# Patient Record
Sex: Male | Born: 1980 | Race: Black or African American | Hispanic: No | Marital: Single | Smoking: Current every day smoker
Health system: Southern US, Community
[De-identification: ages and names within clinical notes are randomized; demographics above are authoritative.]

## PROBLEM LIST (undated history)

## (undated) DIAGNOSIS — F329 Major depressive disorder, single episode, unspecified: Secondary | ICD-10-CM

## (undated) DIAGNOSIS — G4700 Insomnia, unspecified: Secondary | ICD-10-CM

## (undated) DIAGNOSIS — E871 Hypo-osmolality and hyponatremia: Secondary | ICD-10-CM

## (undated) DIAGNOSIS — L0390 Cellulitis, unspecified: Secondary | ICD-10-CM

## (undated) DIAGNOSIS — F1010 Alcohol abuse, uncomplicated: Secondary | ICD-10-CM

## (undated) DIAGNOSIS — F419 Anxiety disorder, unspecified: Secondary | ICD-10-CM

## (undated) DIAGNOSIS — F319 Bipolar disorder, unspecified: Secondary | ICD-10-CM

## (undated) DIAGNOSIS — L0291 Cutaneous abscess, unspecified: Secondary | ICD-10-CM

## (undated) DIAGNOSIS — D72829 Elevated white blood cell count, unspecified: Secondary | ICD-10-CM

## (undated) DIAGNOSIS — F32A Depression, unspecified: Secondary | ICD-10-CM

## (undated) DIAGNOSIS — M549 Dorsalgia, unspecified: Secondary | ICD-10-CM

## (undated) DIAGNOSIS — G8929 Other chronic pain: Secondary | ICD-10-CM

## (undated) DIAGNOSIS — K859 Acute pancreatitis without necrosis or infection, unspecified: Secondary | ICD-10-CM

## (undated) HISTORY — PX: INCISION AND DRAINAGE ABSCESS: SHX5864

---

## 1999-03-30 ENCOUNTER — Emergency Department (HOSPITAL_COMMUNITY): Admission: EM | Admit: 1999-03-30 | Discharge: 1999-03-30 | Payer: Self-pay | Admitting: Emergency Medicine

## 2006-05-27 ENCOUNTER — Emergency Department (HOSPITAL_COMMUNITY): Admission: EM | Admit: 2006-05-27 | Discharge: 2006-05-28 | Payer: Self-pay | Admitting: Emergency Medicine

## 2006-05-31 ENCOUNTER — Emergency Department (HOSPITAL_COMMUNITY): Admission: EM | Admit: 2006-05-31 | Discharge: 2006-05-31 | Payer: Self-pay | Admitting: Family Medicine

## 2006-11-17 ENCOUNTER — Emergency Department (HOSPITAL_COMMUNITY): Admission: EM | Admit: 2006-11-17 | Discharge: 2006-11-17 | Payer: Self-pay | Admitting: Emergency Medicine

## 2007-05-21 ENCOUNTER — Emergency Department (HOSPITAL_COMMUNITY): Admission: EM | Admit: 2007-05-21 | Discharge: 2007-05-21 | Payer: Self-pay | Admitting: Emergency Medicine

## 2007-09-30 ENCOUNTER — Emergency Department (HOSPITAL_COMMUNITY): Admission: EM | Admit: 2007-09-30 | Discharge: 2007-09-30 | Payer: Self-pay | Admitting: Emergency Medicine

## 2008-08-14 ENCOUNTER — Emergency Department (HOSPITAL_COMMUNITY): Admission: EM | Admit: 2008-08-14 | Discharge: 2008-08-14 | Payer: Self-pay | Admitting: Emergency Medicine

## 2009-06-20 ENCOUNTER — Emergency Department (HOSPITAL_COMMUNITY): Admission: EM | Admit: 2009-06-20 | Discharge: 2009-06-20 | Payer: Self-pay | Admitting: Emergency Medicine

## 2010-12-19 LAB — POCT I-STAT, CHEM 8
BUN: 11 mg/dL (ref 6–23)
Calcium, Ion: 1.14 mmol/L (ref 1.12–1.32)
Chloride: 101 mEq/L (ref 96–112)
Creatinine, Ser: 0.6 mg/dL (ref 0.4–1.5)
Glucose, Bld: 83 mg/dL (ref 70–99)
Potassium: 3.8 mEq/L (ref 3.5–5.1)
Sodium: 139 mEq/L (ref 135–145)
TCO2: 30 mmol/L (ref 0–100)

## 2010-12-19 LAB — CULTURE, ROUTINE-ABSCESS

## 2010-12-19 LAB — GRAM STAIN

## 2011-02-24 ENCOUNTER — Emergency Department (HOSPITAL_COMMUNITY)
Admission: EM | Admit: 2011-02-24 | Discharge: 2011-02-25 | Disposition: A | Discharge status: Home / Self Care | Payer: Self-pay | Attending: Emergency Medicine | Admitting: Emergency Medicine

## 2011-02-24 DIAGNOSIS — N453 Epididymo-orchitis: Secondary | ICD-10-CM | POA: Insufficient documentation

## 2011-02-24 DIAGNOSIS — N5089 Other specified disorders of the male genital organs: Secondary | ICD-10-CM | POA: Insufficient documentation

## 2011-02-24 DIAGNOSIS — N509 Disorder of male genital organs, unspecified: Secondary | ICD-10-CM | POA: Insufficient documentation

## 2011-02-24 LAB — URINALYSIS, ROUTINE W REFLEX MICROSCOPIC
Ketones, ur: 15 mg/dL — AB
Nitrite: NEGATIVE

## 2011-02-24 LAB — URINE MICROSCOPIC-ADD ON

## 2011-02-25 ENCOUNTER — Emergency Department (HOSPITAL_COMMUNITY): Payer: Self-pay

## 2011-02-26 LAB — URINE CULTURE: Culture  Setup Time: 201206120920

## 2011-02-26 LAB — GC/CHLAMYDIA PROBE AMP, URINE: Chlamydia, Swab/Urine, PCR: NEGATIVE

## 2011-06-06 LAB — DIFFERENTIAL
Eosinophils Absolute: 0.1
Eosinophils Relative: 1
Lymphocytes Relative: 25
Lymphs Abs: 1.7
Monocytes Absolute: 0.5
Neutrophils Relative %: 65

## 2011-06-06 LAB — URINALYSIS, ROUTINE W REFLEX MICROSCOPIC
Glucose, UA: NEGATIVE
Ketones, ur: 15 — AB
Nitrite: NEGATIVE
Protein, ur: 30 — AB
Urobilinogen, UA: 1

## 2011-06-06 LAB — RAPID URINE DRUG SCREEN, HOSP PERFORMED
Amphetamines: NOT DETECTED
Barbiturates: NOT DETECTED
Benzodiazepines: NOT DETECTED
Cocaine: NOT DETECTED

## 2011-06-06 LAB — COMPREHENSIVE METABOLIC PANEL
AST: 67 — ABNORMAL HIGH
Albumin: 4.3
BUN: 10
CO2: 26
Chloride: 97
GFR calc Af Amer: >60
Glucose, Bld: 78
Sodium: 135
Total Bilirubin: 0.8

## 2011-06-06 LAB — CBC
HCT: 43.7
RBC: 5.12
RDW: 13.8
WBC: 6.5

## 2011-08-12 ENCOUNTER — Encounter: Payer: Self-pay | Admitting: Emergency Medicine

## 2011-08-12 ENCOUNTER — Emergency Department (HOSPITAL_COMMUNITY)
Admission: EM | Admit: 2011-08-12 | Discharge: 2011-08-12 | Discharge status: Against Medical Advice | Payer: Self-pay | Attending: Emergency Medicine | Admitting: Emergency Medicine

## 2011-08-12 DIAGNOSIS — Z0389 Encounter for observation for other suspected diseases and conditions ruled out: Secondary | ICD-10-CM | POA: Insufficient documentation

## 2011-08-12 NOTE — ED Notes (Signed)
PT. REPORTS VISUAL HALLUCINATIONS - STATES SOMEBODY IS CHASING HIM " THEY ARE AFTER MY MONEY" , DENIES SUICIDAL IDEATION .

## 2011-08-13 ENCOUNTER — Encounter (HOSPITAL_COMMUNITY): Payer: Self-pay | Admitting: Emergency Medicine

## 2011-08-13 ENCOUNTER — Emergency Department (HOSPITAL_COMMUNITY)
Admission: EM | Admit: 2011-08-13 | Discharge: 2011-08-13 | Discharge status: Against Medical Advice | Payer: Self-pay | Attending: Emergency Medicine | Admitting: Emergency Medicine

## 2011-08-13 DIAGNOSIS — M25579 Pain in unspecified ankle and joints of unspecified foot: Secondary | ICD-10-CM | POA: Insufficient documentation

## 2011-08-13 DIAGNOSIS — M25473 Effusion, unspecified ankle: Secondary | ICD-10-CM | POA: Insufficient documentation

## 2011-08-13 DIAGNOSIS — M25476 Effusion, unspecified foot: Secondary | ICD-10-CM | POA: Insufficient documentation

## 2011-08-13 NOTE — ED Notes (Signed)
PT. ARRIVED WITH PTAR FROM STREET REPORTS RIIGHT ANKLE PAIN X SEVERAL DAYS WITH SWELLING , DENIES INJURY OR FALL .

## 2011-08-13 NOTE — ED Notes (Signed)
Returned to triage 

## 2011-08-13 NOTE — ED Notes (Signed)
Pt walked out stating that he would go someplace else. Would not sign AMA/LWBS form.

## 2011-08-13 NOTE — ED Notes (Signed)
Pt had made remark to Antonio Alvarez, that he likes to kill people.  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  Security asked to check pt who has on bulky clothing for possible weapons and long sharpened rod removed and placed at nurse station for now.

## 2011-08-13 NOTE — ED Notes (Signed)
Pt instructed to keep feet elevated on bed for MD to assess.  Right ankle/foot red,swollen and warm to touch.

## 2011-08-14 ENCOUNTER — Encounter (HOSPITAL_COMMUNITY): Payer: Self-pay

## 2011-08-14 ENCOUNTER — Emergency Department (HOSPITAL_COMMUNITY): Payer: Self-pay

## 2011-08-14 ENCOUNTER — Inpatient Hospital Stay (HOSPITAL_COMMUNITY)
Admission: EM | Admit: 2011-08-14 | Discharge: 2011-08-16 | DRG: 603 | Disposition: A | Discharge status: Home / Self Care | Payer: Self-pay | Attending: Internal Medicine | Admitting: Internal Medicine

## 2011-08-14 DIAGNOSIS — E860 Dehydration: Secondary | ICD-10-CM | POA: Diagnosis present

## 2011-08-14 DIAGNOSIS — L0390 Cellulitis, unspecified: Secondary | ICD-10-CM

## 2011-08-14 DIAGNOSIS — F17200 Nicotine dependence, unspecified, uncomplicated: Secondary | ICD-10-CM | POA: Diagnosis present

## 2011-08-14 DIAGNOSIS — L03119 Cellulitis of unspecified part of limb: Principal | ICD-10-CM | POA: Diagnosis present

## 2011-08-14 DIAGNOSIS — E871 Hypo-osmolality and hyponatremia: Secondary | ICD-10-CM | POA: Diagnosis present

## 2011-08-14 DIAGNOSIS — F1010 Alcohol abuse, uncomplicated: Secondary | ICD-10-CM | POA: Diagnosis present

## 2011-08-14 DIAGNOSIS — L02619 Cutaneous abscess of unspecified foot: Principal | ICD-10-CM | POA: Diagnosis present

## 2011-08-14 DIAGNOSIS — D72829 Elevated white blood cell count, unspecified: Secondary | ICD-10-CM | POA: Diagnosis present

## 2011-08-14 DIAGNOSIS — E876 Hypokalemia: Secondary | ICD-10-CM | POA: Diagnosis present

## 2011-08-14 DIAGNOSIS — L03115 Cellulitis of right lower limb: Secondary | ICD-10-CM | POA: Diagnosis present

## 2011-08-14 DIAGNOSIS — F10239 Alcohol dependence with withdrawal, unspecified: Secondary | ICD-10-CM | POA: Diagnosis present

## 2011-08-14 HISTORY — DX: Cutaneous abscess, unspecified: L02.91

## 2011-08-14 LAB — DIFFERENTIAL
Basophils Relative: 0 % (ref 0–1)
Eosinophils Absolute: 0.2 10*3/uL (ref 0.0–0.7)
Eosinophils Relative: 1 % (ref 0–5)
Lymphs Abs: 1.9 10*3/uL (ref 0.7–4.0)
Neutrophils Relative %: 78 % — ABNORMAL HIGH (ref 43–77)

## 2011-08-14 LAB — CBC
MCH: 29.6 pg (ref 26.0–34.0)
MCHC: 34 g/dL (ref 30.0–36.0)
MCV: 87.1 fL (ref 78.0–100.0)
Platelets: 385 10*3/uL (ref 150–400)
RBC: 4.87 MIL/uL (ref 4.22–5.81)
RDW: 13.9 % (ref 11.5–15.5)

## 2011-08-14 LAB — BASIC METABOLIC PANEL
Calcium: 9.3 mg/dL (ref 8.4–10.5)
GFR calc Af Amer: >90 mL/min (ref >90)
GFR calc non Af Amer: >90 mL/min (ref >90)
Glucose, Bld: 104 mg/dL — ABNORMAL HIGH (ref 70–99)
Sodium: 127 mEq/L — ABNORMAL LOW (ref 135–145)

## 2011-08-14 LAB — SEDIMENTATION RATE: Sed Rate: 44 mm/hr — ABNORMAL HIGH (ref 0–16)

## 2011-08-14 MED ORDER — THERA M PLUS PO TABS
1.0000 | ORAL_TABLET | Freq: Every day | ORAL | Status: DC
  Administered 2011-08-14: 12:00:00 via ORAL
  Administered 2011-08-15: 1 via ORAL
  Administered 2011-08-16: 10:00:00 via ORAL
  Filled 2011-08-14 (×4): qty 1

## 2011-08-14 MED ORDER — FOLIC ACID 1 MG PO TABS
1.0000 mg | ORAL_TABLET | Freq: Every day | ORAL | Status: DC
  Administered 2011-08-14 – 2011-08-16 (×3): 1 mg via ORAL
  Filled 2011-08-14 (×4): qty 1

## 2011-08-14 MED ORDER — MORPHINE SULFATE 2 MG/ML IJ SOLN
1.0000 mg | INTRAMUSCULAR | Status: DC | PRN
  Administered 2011-08-16: 1 mg via INTRAVENOUS
  Filled 2011-08-14: qty 1

## 2011-08-14 MED ORDER — VANCOMYCIN HCL 1000 MG IV SOLR
1250.0000 mg | Freq: Two times a day (BID) | INTRAVENOUS | Status: DC
  Administered 2011-08-14 – 2011-08-16 (×4): 1250 mg via INTRAVENOUS
  Filled 2011-08-14 (×6): qty 1250

## 2011-08-14 MED ORDER — ACETAMINOPHEN 650 MG RE SUPP: 650.0000 mg | Freq: Four times a day (QID) | RECTAL | Status: DC | PRN

## 2011-08-14 MED ORDER — ONDANSETRON HCL 4 MG PO TABS: 4.0000 mg | ORAL_TABLET | Freq: Four times a day (QID) | ORAL | Status: DC | PRN

## 2011-08-14 MED ORDER — POLYETHYLENE GLYCOL 3350 17 G PO PACK
17.0000 g | PACK | Freq: Every day | ORAL | Status: DC
  Administered 2011-08-14 – 2011-08-16 (×3): 17 g via ORAL
  Filled 2011-08-14 (×4): qty 1

## 2011-08-14 MED ORDER — VITAMIN B-1 100 MG PO TABS
100.0000 mg | ORAL_TABLET | Freq: Every day | ORAL | Status: DC
  Administered 2011-08-14 – 2011-08-16 (×3): 100 mg via ORAL
  Filled 2011-08-14 (×4): qty 1

## 2011-08-14 MED ORDER — OXYCODONE HCL 5 MG PO TABS
5.0000 mg | ORAL_TABLET | ORAL | Status: DC | PRN
  Administered 2011-08-16: 5 mg via ORAL
  Filled 2011-08-14: qty 1

## 2011-08-14 MED ORDER — SODIUM CHLORIDE 0.9 % IV BOLUS (SEPSIS)
1000.0000 mL | Freq: Once | INTRAVENOUS | Status: AC
  Administered 2011-08-14: 1000 mL via INTRAVENOUS

## 2011-08-14 MED ORDER — ACETAMINOPHEN 325 MG PO TABS: 650.0000 mg | ORAL_TABLET | Freq: Four times a day (QID) | ORAL | Status: DC | PRN

## 2011-08-14 MED ORDER — SODIUM CHLORIDE 0.9 % IV SOLN
INTRAVENOUS | Status: DC
  Administered 2011-08-14 – 2011-08-16 (×4): via INTRAVENOUS

## 2011-08-14 MED ORDER — ONDANSETRON HCL 4 MG/2ML IJ SOLN: 4.0000 mg | Freq: Four times a day (QID) | INTRAMUSCULAR | Status: DC | PRN

## 2011-08-14 MED ORDER — CLINDAMYCIN PHOSPHATE 900 MG/50ML IV SOLN
900.0000 mg | Freq: Once | INTRAVENOUS | Status: AC
  Administered 2011-08-14: 900 mg via INTRAVENOUS
  Filled 2011-08-14: qty 50

## 2011-08-14 MED ORDER — ENOXAPARIN SODIUM 40 MG/0.4ML ~~LOC~~ SOLN
40.0000 mg | SUBCUTANEOUS | Status: DC
  Administered 2011-08-14 – 2011-08-15 (×2): 40 mg via SUBCUTANEOUS
  Filled 2011-08-14 (×4): qty 0.4

## 2011-08-14 NOTE — ED Notes (Signed)
Pt states he is homeless-states developed pain and swelling to his right foot and ankle 1 week ago-states he has been walking long distances and states the pain and swelling has increased-denies injury-no open wound noted

## 2011-08-14 NOTE — H&P (Signed)
Hospital Admission Note Date: 08/14/2011  Patient name: Antonio Alvarez           Medical record number: [id number] Date of birth: 19-Dec-1980           Age: 30 y.o.   Gender: male    PCP:   No primary provider on file.   Chief Complaint:  My right ankle hurts  HPI: Antonio Alvarez is a 30 y.o. male with no significant past medical history came in to the hospital because of her right ankle pain and swelling. Patient said his right ankle started to swell about 7 days ago. Soon needed and noticed redness pain and tenderness when he walks. Patient denies any fever or chills. Denies any nausea or vomiting. Upon initial evaluation in the emergency department patient right ankle as well and he has very red skin so the ankle was not tapped to rule out any joint involvement. His sodium also is 127 patient will be admitted for further evaluation.   Past Medical History: Past Medical History  Diagnosis Date  . Abscess     hx of abscess on neck   Past Surgical History  Procedure Date  . Abscess removed from neck     Medications: He does not take any medications at home.  Allergies:  No Known Allergies  Social History:  reports that he has been smoking Cigarettes.  He has a 7.5 pack-year smoking history. He has never used smokeless tobacco. He reports that he drinks about 21 ounces of alcohol per week. He reports that he uses illicit drugs (Marijuana).  Family History: Family History  Problem Relation Age of Onset  . Alcohol abuse Father     Review of Systems:  Constitutional: negative for anorexia, fevers and sweats Eyes: negative for irritation, redness and visual disturbance Ears, nose, mouth, throat, and face: negative for earaches, epistaxis, nasal congestion and sore throat Respiratory: negative for cough, dyspnea on exertion, sputum and wheezing Cardiovascular: negative for chest pain, dyspnea, lower extremity edema, orthopnea, palpitations and syncope Gastrointestinal: negative  for abdominal pain, constipation, diarrhea, melena, nausea and vomiting Genitourinary:negative for dysuria, frequency and hematuria Hematologic/lymphatic: negative for bleeding, easy bruising and lymphadenopathy Musculoskeletal:negative for arthralgias, muscle weakness and stiff joints Neurological: negative for coordination problems, gait problems, headaches and weakness Endocrine: negative for diabetic symptoms including polydipsia, polyuria and weight loss Allergic/Immunologic: negative for anaphylaxis, hay fever and urticaria  Physical Exam: BP 116/60  Pulse 71  Temp(Src) 98 F (36.7 C) (Oral)  Resp 16  Ht 5\' 11"  (1.803 m)  Wt 61.236 kg (135 lb)  BMI 18.83 kg/m2  SpO2 98% General appearance: alert, cooperative and no distress  Head: Normocephalic, without obvious abnormality, atraumatic  Eyes: conjunctivae/corneas clear. PERRL, EOM's intact. Fundi benign.  Nose: Nares normal. Septum midline. Mucosa normal. No drainage or sinus tenderness.  Throat: lips, mucosa, and tongue normal; teeth and gums normal  Neck: Supple, no masses, no cervical lymphadenopathy, no JVD appreciated, no meningeal signs Resp: clear to auscultation bilaterally  Chest wall: no tenderness  Cardio: regular rate and rhythm, S1, S2 normal, no murmur, click, rub or gallop  GI: soft, non-tender; bowel sounds normal; no masses, no organomegaly  Extremities: extremities normal, atraumatic, no cyanosis or edema  Skin: Skin color, texture, turgor normal. No rashes or lesions  Neurologic: Alert and oriented X 3, normal strength and tone. Normal symmetric reflexes. Normal coordination and gait  Labs on Admission:   [facility] 08/14/11 0810  NA 127*  K 3.6  CL 91*  CO2 27  GLUCOSE 104*  BUN 16  CREATININE 0.83  CALCIUM 9.3  MG --  PHOS --   Basename 08/14/11 0810  WBC 14.1*  NEUTROABS 10.9*  HGB 14.4  HCT 42.4  MCV 87.1  PLT 385   Radiological Exams on Admission: Dg Ankle Complete Right  08/14/2011   *RADIOLOGY REPORT*  Clinical Data: Pain and swelling, redness, no known injury  RIGHT ANKLE - COMPLETE 3+ VIEW  Comparison: None  Findings: Diffuse soft tissue swelling. Ankle mortise intact. Bone mineralization normal. No acute fracture, dislocation or bone destruction.  IMPRESSION: No acute bony abnormalities.  Original Report Authenticated By: Lollie Marrow, M.D.   Dg Foot Complete Right  08/14/2011  *RADIOLOGY REPORT*  Clinical Data: Pain and swelling to right foot, radiating to the lower leg, no known trauma  RIGHT FOOT COMPLETE - 3+ VIEW  Comparison: None.  Findings: No fracture or dislocation.  Joint spaces are preserved. There is a minimal amount of diffuse soft tissue swelling about the mid and hind foot without associated radiopaque foreign body or subcutaneous emphysema.  No discrete areas of osteolysis to suggest osteomyelitis.  IMPRESSION: Mild diffuse tissue swelling about the mid and hind foot without associated fracture or radiopaque foreign body.  Original Report Authenticated By: Waynard Reeds, M.D.     IMPRESSION: Present on Admission:  .Cellulitis of right foot .Hyponatremia .Leukocytosis .Dehydration .Alcohol abuse  Assessment/Plan  1. Cellulitis of the right foot: Patient will be admitted to the hospital to MedSurg bed. Will be started on IV vancomycin. Probably patient will benefit from the de-escalating the antibiotics in 1 or 2 days if he gets better. Patient does appear to have gouty arthritis or any other type of joint movement. This resembles his life without any problems.  2. Alcohol use: Patient said he drinks about 4-5 beers a day, he never had a history of alcohol withdrawal. We'll put him on multivitamin, thiamine and B12. We watch for alcohol withdrawal symptoms.  3. Leukocytosis: this is likely secondary to the cellulitis, the antibiotics started and will check the CBC in the morning.  4. Hyponatremia: This is likely because of dehydration. Alcohol also  might play a role here, Beer potomania sometimes can causes low sodium level, patient seems dehydrated I will start him on the normal saline.   Ruthella Kirchman A 08/14/2011, 11:19 AM

## 2011-08-14 NOTE — Discharge Planning (Signed)
ED CM noted Referral from Admission RN.  Referral made to unit CM possible d/c needs and Unit SW for homelessness

## 2011-08-14 NOTE — ED Notes (Signed)
Pt transported to xray via wheel chair

## 2011-08-14 NOTE — Progress Notes (Signed)
ANTIBIOTIC CONSULT NOTE - INITIAL  Pharmacy Consult for Vancomycin Indication: Cellulitis  No Known Allergies  Patient Measurements: Height: 5\' 11"  (180.3 cm) Weight: 135 lb (61.236 kg) IBW/kg (Calculated) : 75.3   Vital Signs: Temp: 97.9 F (36.6 C) (11/29 1210) Temp src: Oral (11/29 1210) BP: 121/67 mmHg (11/29 1210) Pulse Rate: 79  (11/29 1210) Intake/Output from previous day:   Intake/Output from this shift:    Labs:  [facility] 08/14/11 0810  WBC 14.1*  HGB 14.4  PLT 385  LABCREA --  CREATININE 0.83   Estimated Creatinine Clearance: 110.6 ml/min (by C-G formula based on Cr of 0.83). No results found for this basename: VANCOTROUGH:2,VANCOPEAK:2,VANCORANDOM:2,GENTTROUGH:2,GENTPEAK:2,GENTRANDOM:2,TOBRATROUGH:2,TOBRAPEAK:2,TOBRARND:2,AMIKACINPEAK:2,AMIKACINTROU:2,AMIKACIN:2, in the last 72 hours   Microbiology: No results found for this or any previous visit (from the past 720 hour(s)).  Medical History: Past Medical History  Diagnosis Date  . Abscess     hx of abscess on neck    Medications:  No prescriptions prior to admission   Scheduled:    . clindamycin (CLEOCIN) IV  900 mg Intravenous Once  . enoxaparin  40 mg Subcutaneous Q24H  . folic acid  1 mg Oral Daily  . multivitamins ther. w/minerals  1 tablet Oral Daily  . polyethylene glycol  17 g Oral Daily  . sodium chloride  1,000 mL Intravenous Once  . thiamine  100 mg Oral Daily   Infusions:    . sodium chloride 100 mL/hr at 08/14/11 1604   Anti-infectives     Start     Dose/Rate Route Frequency Ordered Stop   08/14/11 1000   clindamycin (CLEOCIN) IVPB 900 mg        900 mg 100 mL/hr over 30 Minutes Intravenous  Once 08/14/11 0856 08/14/11 1015         Assessment: Empiric Vancomycin for right ankle cellulitis.  Goal of Therapy:  Vancomycin trough level 10-15 mcg/ml  Plan:  Vancomycin 1250mg  IV q12h Measure antibiotic drug levels at steady state Follow up culture results  Reece Packer 08/14/2011,4:34 PM

## 2011-08-14 NOTE — Plan of Care (Signed)
Problem: Consults Goal: Cellulitis Patient Education See Patient Education Module for education specifics. Outcome: Progressing Explained to patient his admitting diagnosis and course of treatment to resolve cellulitis.  Problem: Phase I Progression Outcomes Goal: Wound assessment- dressing change as appropriate Outcome: Not Applicable Date Met:  08/14/11 Patient does not have an open wound associated with cellulitis to the right ankle.

## 2011-08-14 NOTE — Discharge Planning (Signed)
ED CM spoke with pt in Rm #6.  Confirmed self pay, no pcp [facility] resident, never been to health [facility].  Cm discussed with pt [facility] services, [employer], Costs & eligibility processes for self pay medical services, definition of self pay, referrals to [facility] and financial counselor (which he agreed to), community resources for homelessness, financial assistance, housing, health department and how to apply for low cost health insurance.  Pt has not request assistance for medications but is aware of low cost $4 medications at [facility].  Pt inquired about "job"  Referred & encouraged to go to any [employer] human resources department on any campus for job application.  Pt appreciative of referrals and resources. Left written information packet about all resources discussed with pt.  Referral sent to [facility] coordinator also Will be seen by financial counselor after 1 day admission

## 2011-08-14 NOTE — ED Provider Notes (Addendum)
History     CSN: [id number] Arrival date & time: 08/14/2011  6:25 AM   First MD Initiated Contact with Patient 08/14/11 [phone]      Chief Complaint  Patient presents with  . Joint Swelling    (Consider location/radiation/quality/duration/timing/severity/associated sxs/prior treatment) Patient is a 30 y.o. male presenting with ankle pain. The history is provided by the patient.  Ankle Pain  Incident onset: About one week ago. Incident location: Patient has been sleeping in a back room. He thinks his foot may have been hit. The injury mechanism is unknown. The pain is present in the right ankle and right foot. The quality of the pain is described as aching (Pain is the worst when standing and walking). The pain is at a severity of 4/10. The pain is moderate. The pain has been worsening since onset. Pertinent negatives include no inability to bear weight, no loss of motion, no loss of sensation and no tingling. Associated symptoms comments: However he is still able to walk. He reports no foreign bodies present. The symptoms are aggravated by activity, bearing weight and palpation. He has tried rest for the symptoms. The treatment provided no relief.    History reviewed. No pertinent past medical history.  History reviewed. No pertinent past surgical history.  No family history on file.  History  Substance Use Topics  . Smoking status: Current Everyday Smoker -- 1.0 packs/day    Types: Cigarettes  . Smokeless tobacco: Not on file  . Alcohol Use: Yes      Review of Systems  Constitutional: Negative for fever and fatigue.  Cardiovascular: Positive for leg swelling.  Gastrointestinal: Negative for abdominal pain.  Musculoskeletal: Positive for joint swelling. Negative for back pain.  Skin: Negative for rash.       Redness of the foot  Neurological: Negative for tingling.  All other systems reviewed and are negative.    Allergies  Review of patient's allergies indicates no  known allergies.  Home Medications  No current outpatient prescriptions on file.  BP 144/83  Pulse 84  Temp(Src) 98 F (36.7 C) (Oral)  Resp 16  Ht 5\' 11"  (1.803 m)  Wt 135 lb (61.236 kg)  BMI 18.83 kg/m2  SpO2 97%  Physical Exam  Nursing note and vitals reviewed. Constitutional: He is oriented to person, place, and time. He appears well-developed and well-nourished. No distress.  HENT:  Head: Normocephalic and atraumatic.  Mouth/Throat: Oropharynx is clear and moist.  Eyes: Conjunctivae and EOM are normal. Pupils are equal, round, and reactive to light.  Neck: Normal range of motion. Neck supple.  Cardiovascular: Normal rate, regular rhythm and intact distal pulses.   No murmur heard. Pulmonary/Chest: Effort normal and breath sounds normal. No respiratory distress. He has no wheezes. He has no rales.  Musculoskeletal: Normal range of motion. He exhibits edema and tenderness.       Right ankle: He exhibits swelling. He exhibits normal range of motion. tenderness.       3+ pitting edema of the right ankle with warmth and erythema from just proximal to the right ankle to the mid right foot. Normal pulses, normal sensation, less than 2 second cap refill.  Neurological: He is alert and oriented to person, place, and time.  Skin: Skin is warm and dry. No rash noted. There is erythema.  Psychiatric: He has a normal mood and affect. His behavior is normal.    ED Course  Procedures (including critical care time)  Labs Reviewed  CBC -  Abnormal; Notable for the following:    WBC 14.1 (*)    All other components within normal limits  DIFFERENTIAL - Abnormal; Notable for the following:    Neutrophils Relative 78 (*)    Neutro Abs 10.9 (*)    Monocytes Absolute 1.1 (*)    All other components within normal limits  BASIC METABOLIC PANEL  URIC ACID  SEDIMENTATION RATE   Dg Foot Complete Right  08/14/2011  *RADIOLOGY REPORT*  Clinical Data: Pain and swelling to right foot,  radiating to the lower leg, no known trauma  RIGHT FOOT COMPLETE - 3+ VIEW  Comparison: None.  Findings: No fracture or dislocation.  Joint spaces are preserved. There is a minimal amount of diffuse soft tissue swelling about the mid and hind foot without associated radiopaque foreign body or subcutaneous emphysema.  No discrete areas of osteolysis to suggest osteomyelitis.  IMPRESSION: Mild diffuse tissue swelling about the mid and hind foot without associated fracture or radiopaque foreign body.  Original Report Authenticated By: Waynard Reeds, M.D.     1. Cellulitis   2. Hyponatremia       MDM   Patient is homeless and states about one week ago started to develop pain in his right foot and ankle area. Says he has been walking many miles every day and his ankle has continued to swell and become more painful. On exam today patient's right ankle is swollen warm and erythematous. The erythema is focused mostly just around the ankle there is no warmth in the distal foot or in the calf. Patient denies fever or any systemic symptoms. He states that his foot may have been hit by something while he was sleeping. Patient is able to range the ankle and doubts that this is a septic joint. Patient is not diabetic and has no immunocompromising conditions. No signs of abscess or skin break.  Possible cellulitis versus gout. We'll get plain films to evaluate her injury check CBC, ESR, uric acid. Will attempt to tap the joint.   On reevaluation of the joint and there is bright red masses over both the lateral and medial malleolus and currently will be unable to aspirate the joint. Will start patient on Clinda and admit for observation.  Gwyneth Sprout, MD 08/14/11 5409  Gwyneth Sprout, MD 08/14/11 [phone]

## 2011-08-15 LAB — CBC
HCT: 40.1 % (ref 39.0–52.0)
MCH: 29.6 pg (ref 26.0–34.0)
MCV: 87.2 fL (ref 78.0–100.0)
Platelets: 373 10*3/uL (ref 150–400)
RDW: 14.1 % (ref 11.5–15.5)
WBC: 9.2 10*3/uL (ref 4.0–10.5)

## 2011-08-15 LAB — COMPREHENSIVE METABOLIC PANEL
AST: 34 U/L (ref 0–37)
Albumin: 2.9 g/dL — ABNORMAL LOW (ref 3.5–5.2)
BUN: 10 mg/dL (ref 6–23)
CO2: 28 mEq/L (ref 19–32)
Calcium: 8.6 mg/dL (ref 8.4–10.5)
Chloride: 101 mEq/L (ref 96–112)
Creatinine, Ser: 0.69 mg/dL (ref 0.50–1.35)
GFR calc non Af Amer: >90 mL/min (ref >90)
Total Bilirubin: 0.5 mg/dL (ref 0.3–1.2)

## 2011-08-15 MED ORDER — POTASSIUM CHLORIDE CRYS ER 20 MEQ PO TBCR
40.0000 meq | EXTENDED_RELEASE_TABLET | Freq: Once | ORAL | Status: AC
  Administered 2011-08-15: 40 meq via ORAL
  Filled 2011-08-15: qty 2

## 2011-08-15 NOTE — Progress Notes (Signed)
DAILY PROGRESS NOTE                              GENERAL INTERNAL MEDICINE [facility]  SUBJECTIVE:  Feels much better, denies any fever or chills less redness and swelling in his ankle  OBJECTIVE: BP 115/74  Pulse 65  Temp(Src) 98.3 F (36.8 C) (Oral)  Resp 20  Ht 5\' 11"  (1.803 m)  Wt 61.236 kg (135 lb)  BMI 18.83 kg/m2  SpO2 99%  Intake/Output Summary (Last 24 hours) at 08/15/11 1027 Last data filed at 08/15/11 0540  Gross per 24 hour  Intake 966.67 ml  Output      0 ml  Net 966.67 ml                      Weight change: 0 kg (0 lb) Physical Exam: General: Alert and awake oriented x3 not in any acute distress. HEENT: anicteric sclera, pupils equal reactive to light and accommodation CVS: S1-S2 heard, no murmur rubs or gallops Chest: clear to auscultation bilaterally, no wheezing rales or rhonchi Abdomen:  normal bowel sounds, soft, nontender, nondistended, no organomegaly Neuro: Cranial nerves II-XII intact, no focal neurological deficits Extremities: Right ankle swelling, redness and tenderness improved very much since yesterday   Lab Results:  [facility] 08/15/11 0514 08/14/11 0810  NA 134* 127*  K 3.3* 3.6  CL 101 91*  CO2 28 27  GLUCOSE 124* 104*  BUN 10 16  CREATININE 0.69 0.83  CALCIUM 8.6 9.3  MG -- --  PHOS -- --    Basename 08/15/11 0514  AST 34  ALT 26  ALKPHOS 110  BILITOT 0.5  PROT 7.3  ALBUMIN 2.9*   No results found for this basename: LIPASE:2,AMYLASE:2 in the last 72 hours  Basename 08/15/11 0514 08/14/11 0810  WBC 9.2 14.1*  NEUTROABS -- 10.9*  HGB 13.6 14.4  HCT 40.1 42.4  MCV 87.2 87.1  PLT 373 385   Studies/Results: Dg Ankle Complete Right 08/14/2011  *RADIOLOGY REPORT*  IMPRESSION: No acute bony abnormalities.  Original Report Authenticated By: Lollie Marrow, M.D.   Dg Foot Complete Right 08/14/2011  *RADIOLOGY REPORT*     IMPRESSION: Mild diffuse tissue swelling about the mid and hind foot without associated fracture  or radiopaque foreign body.  Original Report Authenticated By: Waynard Reeds, M.D.   Medications: Scheduled Meds:   . enoxaparin  40 mg Subcutaneous Q24H  . folic acid  1 mg Oral Daily  . multivitamins ther. w/minerals  1 tablet Oral Daily  . polyethylene glycol  17 g Oral Daily  . sodium chloride  1,000 mL Intravenous Once  . thiamine  100 mg Oral Daily  . vancomycin  1,250 mg Intravenous Q12H   Continuous Infusions:   . sodium chloride 100 mL/hr at 08/15/11 0259   PRN Meds:.acetaminophen, acetaminophen, morphine, ondansetron (ZOFRAN) IV, ondansetron, oxyCODONE  ASSESSMENT & PLAN: Principal Problem:  *Cellulitis of right foot Active Problems:  Hyponatremia  Leukocytosis  Dehydration  Alcohol abuse  1. Cellulitis of the right foot: Started on IV vancomycin. Improved very much. It does not seem like have any joint involvement. Probably patient came to home tomorrow on oral antibiotics clindamycin, doxycycline or Bactrim.   2. Alcohol use: Patient said he drinks about 4-5 beers a day, he never had a history of alcohol withdrawal. We'll put him on multivitamin, thiamine and B12. We watch for alcohol withdrawal symptoms.  3. Leukocytosis: this is likely secondary to the cellulitis, resolved and.   4. Hyponatremia: This is likely because of dehydration. Alcohol also might play a role here, Beer potomania sometimes can causes low sodium level, this is resolved after IV fluids infusion.  5. Hypokalemia: Will replace.    LOS: 1 day   Kiowa Peifer A 08/15/2011, 10:27 AM

## 2011-08-16 LAB — BASIC METABOLIC PANEL
Chloride: 100 mEq/L (ref 96–112)
GFR calc Af Amer: >90 mL/min (ref >90)
GFR calc non Af Amer: >90 mL/min (ref >90)
Potassium: 3.5 mEq/L (ref 3.5–5.1)
Sodium: 133 mEq/L — ABNORMAL LOW (ref 135–145)

## 2011-08-16 MED ORDER — SULFAMETHOXAZOLE-TRIMETHOPRIM 800-160 MG PO TABS: 1.0000 | ORAL_TABLET | Freq: Two times a day (BID) | ORAL | Status: AC

## 2011-08-16 MED ORDER — POTASSIUM CHLORIDE 20 MEQ PO PACK
20.0000 meq | PACK | Freq: Two times a day (BID) | ORAL | Status: DC
  Filled 2011-08-16 (×2): qty 1

## 2011-08-16 MED ORDER — THIAMINE HCL 100 MG PO TABS: 100.0000 mg | ORAL_TABLET | Freq: Every day | ORAL | Status: AC

## 2011-08-16 MED ORDER — OXYCODONE HCL 5 MG PO TABS: 5.0000 mg | ORAL_TABLET | ORAL | Status: AC | PRN

## 2011-08-16 MED ORDER — FOLIC ACID 1 MG PO TABS: 1.0000 mg | ORAL_TABLET | Freq: Every day | ORAL | Status: AC

## 2011-08-16 MED ORDER — THERA M PLUS PO TABS: 1.0000 | ORAL_TABLET | Freq: Every day | ORAL | Status: DC

## 2011-08-16 MED ORDER — POTASSIUM CHLORIDE CRYS ER 20 MEQ PO TBCR
20.0000 meq | EXTENDED_RELEASE_TABLET | Freq: Two times a day (BID) | ORAL | Status: DC
  Administered 2011-08-16: 20 meq via ORAL
  Filled 2011-08-16 (×4): qty 1

## 2011-08-16 NOTE — Progress Notes (Signed)
Cm spoke with pt concerning medication assistance. Pt eligible for indigent funds. Pt instructed that medication assistance program eligibility 1x year. Pt given previous resource for PCP follow-up. Pharmacy to notify RN Marylene Land when abx rx ready for pick up.

## 2011-08-16 NOTE — Discharge Summary (Signed)
Patient ID: Antonio Alvarez MRN: [id number] DOB/AGE: October 03, 1980 30 y.o.  Admit date: 08/14/2011 Discharge date: 08/16/2011  Primary Care Physician:  No primary provider on file.  Discharge Diagnoses:    Present on Admission:  .Cellulitis of right foot .Hyponatremia .Leukocytosis .Dehydration .Alcohol abuse  Principal Problem:  *Cellulitis of right foot Active Problems:  Hyponatremia  Leukocytosis  Dehydration  Alcohol abuse   Current Discharge Medication List    START taking these medications   Details  folic acid (FOLVITE) 1 MG tablet Take 1 tablet (1 mg total) by mouth daily. Qty: 30 tablet, Refills: 0    Multiple Vitamins-Minerals (MULTIVITAMINS THER. W/MINERALS) TABS Take 1 tablet by mouth daily. Qty: 30 each, Refills: 0    oxyCODONE (OXY IR/ROXICODONE) 5 MG immediate release tablet Take 1 tablet (5 mg total) by mouth every 4 (four) hours as needed. Qty: 30 tablet, Refills: 0    sulfamethoxazole-trimethoprim (BACTRIM DS) 800-160 MG per tablet Take 1 tablet by mouth 2 (two) times daily. Qty: 14 tablet, Refills: 0    thiamine 100 MG tablet Take 1 tablet (100 mg total) by mouth daily. Qty: 30 tablet, Refills: 0        Disposition and Follow-up: The patient is medically stable to be discharged home  Consults: None  Significant Diagnostic Studies:  Dg Ankle Complete Right  08/14/2011  *RADIOLOGY REPORT*  Clinical Data: Pain and swelling, redness, no known injury  RIGHT ANKLE - COMPLETE 3+ VIEW  Comparison: None  Findings: Diffuse soft tissue swelling. Ankle mortise intact. Bone mineralization normal. No acute fracture, dislocation or bone destruction.  IMPRESSION: No acute bony abnormalities.  Original Report Authenticated By: Lollie Marrow, M.D.   Dg Foot Complete Right  08/14/2011  *RADIOLOGY REPORT*  Clinical Data: Pain and swelling to right foot, radiating to the lower leg, no known trauma  RIGHT FOOT COMPLETE - 3+ VIEW  Comparison: None.  Findings: No  fracture or dislocation.  Joint spaces are preserved. There is a minimal amount of diffuse soft tissue swelling about the mid and hind foot without associated radiopaque foreign body or subcutaneous emphysema.  No discrete areas of osteolysis to suggest osteomyelitis.  IMPRESSION: Mild diffuse tissue swelling about the mid and hind foot without associated fracture or radiopaque foreign body.  Original Report Authenticated By: Waynard Reeds, M.D.    Brief H and P: Patient is a 30 y.o. male with no significant past medical history who presented to the emergency room department for  right ankle pain and swelling started about one week prior to admission right ankle pain and swelling is associated with redness and tenderness on palpation.  Patient denies any fever or chills. Denies any nausea or vomiting. Upon initial evaluation in the emergency department patient right ankle as well and he has very red skin so the ankle was not tapped to rule out any joint involvement.  Physical Exam on Discharge:  Filed Vitals:   08/14/11 2115 08/15/11 0540 08/15/11 2120 08/16/11 0620  BP: 113/71 115/74 104/65 101/58  Pulse: 54 65 67 56  Temp: 98 F (36.7 C) 98.3 F (36.8 C) 97.7 F (36.5 C) 98.5 F (36.9 C)  TempSrc: Oral Oral Oral Oral  Resp: 16 20 16 20   Height:      Weight:      SpO2: 99% 99% 96% 99%     Intake/Output Summary (Last 24 hours) at 08/16/11 1154 Last data filed at 08/16/11 0900  Gross per 24 hour  Intake 3916.67 ml  Output      0 ml  Net 3916.67 ml    General: Alert, awake, oriented x3, in no acute distress. HEENT: No bruits, no goiter. Heart: Regular rate and rhythm, without murmurs, rubs, gallops. Lungs: Clear to auscultation bilaterally. Abdomen: Soft, nontender, nondistended, positive bowel sounds. Extremities: No clubbing cyanosis or edema with positive pedal pulses. Neuro: Grossly intact, nonfocal.  CBC:    Component Value Date/Time   WBC 9.2 08/15/2011 0514   HGB  13.6 08/15/2011 0514   HCT 40.1 08/15/2011 0514   PLT 373 08/15/2011 0514   MCV 87.2 08/15/2011 0514   NEUTROABS 10.9* 08/14/2011 0810   LYMPHSABS 1.9 08/14/2011 0810   MONOABS 1.1* 08/14/2011 0810   EOSABS 0.2 08/14/2011 0810   BASOSABS 0.0 08/14/2011 0810    Basic Metabolic Panel:    Component Value Date/Time   NA 133* 08/16/2011 0447   K 3.5 08/16/2011 0447   CL 100 08/16/2011 0447   CO2 25 08/16/2011 0447   BUN 7 08/16/2011 0447   CREATININE 0.73 08/16/2011 0447   GLUCOSE 102* 08/16/2011 0447   CALCIUM 8.7 08/16/2011 0447    Hospital Course:  Principal Problem:  *Cellulitis of right foot - significantly improved since yesterday, continue Bactrim double strength tablet twice a day for 14 days. Patient verbalized understanding and has communicated he will followup with primary care physician in about one week to reassess the need for antibiotics.  Active Problems:  Hyponatremia - stable at 133  Leukocytosis - patient remains afebrile for more than 24 hours, white blood cells are normal 9.2 at discharge  Alcohol abuse - patient was encouraged to seek help in regards to his alcohol abuse history, continue multivitamin, folic acid, thiamine on discharge Disposition - patient is medically stable and clinically appears well to be discharged home   Time spent on Discharge:   Signed: Aailyah Dunbar 08/16/2011, 11:54 AM

## 2013-02-14 ENCOUNTER — Inpatient Hospital Stay (HOSPITAL_COMMUNITY)
Admission: EM | Admit: 2013-02-14 | Discharge: 2013-02-19 | DRG: 439 | Disposition: A | Discharge status: Home / Self Care | Payer: MEDICAID | Attending: Internal Medicine | Admitting: Internal Medicine

## 2013-02-14 ENCOUNTER — Encounter (HOSPITAL_COMMUNITY): Payer: Self-pay | Admitting: Emergency Medicine

## 2013-02-14 DIAGNOSIS — F1211 Cannabis abuse, in remission: Secondary | ICD-10-CM | POA: Diagnosis present

## 2013-02-14 DIAGNOSIS — F17200 Nicotine dependence, unspecified, uncomplicated: Secondary | ICD-10-CM | POA: Diagnosis present

## 2013-02-14 DIAGNOSIS — F10239 Alcohol dependence with withdrawal, unspecified: Secondary | ICD-10-CM | POA: Diagnosis present

## 2013-02-14 DIAGNOSIS — Q619 Cystic kidney disease, unspecified: Secondary | ICD-10-CM

## 2013-02-14 DIAGNOSIS — D72829 Elevated white blood cell count, unspecified: Secondary | ICD-10-CM | POA: Diagnosis present

## 2013-02-14 DIAGNOSIS — Z720 Tobacco use: Secondary | ICD-10-CM

## 2013-02-14 DIAGNOSIS — K852 Alcohol induced acute pancreatitis without necrosis or infection: Secondary | ICD-10-CM

## 2013-02-14 DIAGNOSIS — E860 Dehydration: Secondary | ICD-10-CM

## 2013-02-14 DIAGNOSIS — E871 Hypo-osmolality and hyponatremia: Secondary | ICD-10-CM

## 2013-02-14 DIAGNOSIS — F1010 Alcohol abuse, uncomplicated: Secondary | ICD-10-CM | POA: Diagnosis present

## 2013-02-14 DIAGNOSIS — K859 Acute pancreatitis without necrosis or infection, unspecified: Principal | ICD-10-CM | POA: Diagnosis present

## 2013-02-14 HISTORY — DX: Hypo-osmolality and hyponatremia: E87.1

## 2013-02-14 HISTORY — DX: Alcohol abuse, uncomplicated: F10.10

## 2013-02-14 HISTORY — DX: Elevated white blood cell count, unspecified: D72.829

## 2013-02-14 HISTORY — DX: Cellulitis, unspecified: L03.90

## 2013-02-14 LAB — CBC WITH DIFFERENTIAL/PLATELET
Basophils Relative: 0 % (ref 0–1)
HCT: 47.2 % (ref 39.0–52.0)
Hemoglobin: 16.7 g/dL (ref 13.0–17.0)
Lymphocytes Relative: 7 % — ABNORMAL LOW (ref 12–46)
Lymphs Abs: 0.9 10*3/uL (ref 0.7–4.0)
Monocytes Absolute: 0.8 10*3/uL (ref 0.1–1.0)
Monocytes Relative: 7 % (ref 3–12)
Neutro Abs: 10.1 10*3/uL — ABNORMAL HIGH (ref 1.7–7.7)
Neutrophils Relative %: 86 % — ABNORMAL HIGH (ref 43–77)
RBC: 5.44 MIL/uL (ref 4.22–5.81)
WBC: 11.7 10*3/uL — ABNORMAL HIGH (ref 4.0–10.5)

## 2013-02-14 LAB — URINALYSIS, ROUTINE W REFLEX MICROSCOPIC
Hgb urine dipstick: NEGATIVE
Ketones, ur: >80 mg/dL — AB
Protein, ur: 30 mg/dL — AB
Urobilinogen, UA: 1 mg/dL (ref 0.0–1.0)

## 2013-02-14 LAB — COMPREHENSIVE METABOLIC PANEL
Albumin: 4.7 g/dL (ref 3.5–5.2)
Alkaline Phosphatase: 162 U/L — ABNORMAL HIGH (ref 39–117)
BUN: 9 mg/dL (ref 6–23)
CO2: 29 mEq/L (ref 19–32)
Chloride: 94 mEq/L — ABNORMAL LOW (ref 96–112)
GFR calc Af Amer: >90 mL/min (ref >90)
Glucose, Bld: 123 mg/dL — ABNORMAL HIGH (ref 70–99)
Potassium: 4.2 mEq/L (ref 3.5–5.1)

## 2013-02-14 LAB — MAGNESIUM: Magnesium: 2 mg/dL (ref 1.5–2.5)

## 2013-02-14 MED ORDER — SODIUM CHLORIDE 0.9 % IV BOLUS (SEPSIS)
1000.0000 mL | Freq: Once | INTRAVENOUS | Status: AC
  Administered 2013-02-14: 1000 mL via INTRAVENOUS

## 2013-02-14 MED ORDER — IOHEXOL 300 MG/ML  SOLN
50.0000 mL | Freq: Once | INTRAMUSCULAR | Status: AC | PRN
  Administered 2013-02-14: 50 mL via ORAL

## 2013-02-14 MED ORDER — PROMETHAZINE HCL 25 MG/ML IJ SOLN
25.0000 mg | Freq: Once | INTRAMUSCULAR | Status: AC
  Administered 2013-02-14: 25 mg via INTRAVENOUS
  Filled 2013-02-14: qty 1

## 2013-02-14 NOTE — ED Notes (Signed)
Pt reports LUQ abdominal pain, nausea, vomiting since 12pm. States that he has thrown up 15 times last just prior to arrival to pt room. Pt reports drinking 4 40oz beers yesterday.

## 2013-02-14 NOTE — ED Notes (Signed)
PT. ARRIVED WITH EMS FROM HOME REPORTS LUQ PAIN WITH NAUSEA AND VOMITTING ONSET TODAY , DENIES FEVER OR CHILLS , NO DIARRHEA.

## 2013-02-14 NOTE — ED Provider Notes (Signed)
History     CSN: [id number]  Arrival date & time 02/14/13  2008   First MD Initiated Contact with Patient 02/14/13 2228      Chief Complaint  Patient presents with  . Abdominal Pain    (Consider location/radiation/quality/duration/timing/severity/associated sxs/prior treatment) HPI Comments: 32 y.o. Male with PMHx of ETOH abuse over 17 years and hyponatremia presents today complaining of central abdominal pain that started acutely this morning and worsened over the course of the day. This is a new pain for the pt. Pt describes the pain as severe, cramping, dull, radiating to the left around to his back. Pt states pain is worse with movement. Admits nausea, vomiting several times today. Denies fever, chills, chest pain, shortness of breath, numbness.   Patient is a 31 y.o. male presenting with abdominal pain. The history is provided by the patient.  Abdominal Pain This is a new problem. Episode onset: today at 4:30pm. The problem has been unchanged. Associated symptoms include abdominal pain, anorexia, nausea and vomiting. Pertinent negatives include no change in bowel habit, chest pain, chills, coughing, diaphoresis, fever, headaches, neck pain, numbness, rash or weakness. Exacerbated by: movement. He has tried nothing for the symptoms.    Past Medical History  Diagnosis Date  . Abscess     hx of abscess on neck  . Alcohol abuse   . Cellulitis   . Hyponatremia   . Leukocytosis     Past Surgical History  Procedure Laterality Date  . Abscess removed from neck      Family History  Problem Relation Age of Onset  . Alcohol abuse Father     History  Substance Use Topics  . Smoking status: Current Every Day Smoker -- 0.50 packs/day for 15 years    Types: Cigarettes  . Smokeless tobacco: Never Used  . Alcohol Use: 21.0 oz/week    35 Cans of beer per week      Review of Systems  Constitutional: Negative for fever, chills and diaphoresis.  HENT: Negative for neck pain and  neck stiffness.   Eyes: Negative for visual disturbance.  Respiratory: Negative for apnea, cough, chest tightness and shortness of breath.   Cardiovascular: Negative for chest pain and palpitations.  Gastrointestinal: Positive for nausea, vomiting, abdominal pain and anorexia. Negative for diarrhea, constipation and change in bowel habit.       Central, radiating left to back  Genitourinary: Negative for dysuria.  Musculoskeletal: Negative for gait problem.  Skin: Negative for rash.  Neurological: Negative for dizziness, weakness, light-headedness, numbness and headaches.    Allergies  Review of patient's allergies indicates no known allergies.  Home Medications   Current Outpatient Rx  Name  Route  Sig  Dispense  Refill  . Multiple Vitamins-Minerals (MULTIVITAMINS THER. W/MINERALS) TABS   Oral   Take 1 tablet by mouth daily.   30 each   0     BP 136/89  Pulse 62  Temp(Src) 98.4 F (36.9 C) (Oral)  Resp 16  SpO2 99%  Physical Exam  Nursing note and vitals reviewed. Constitutional: He is oriented to person, place, and time. He appears well-developed and well-nourished. No distress.  HENT:  Head: Normocephalic and atraumatic.  Eyes: Conjunctivae and EOM are normal.  Neck: Normal range of motion. Neck supple.  No meningeal signs  Cardiovascular: Normal rate, regular rhythm and normal heart sounds.  Exam reveals no gallop and no friction rub.   No murmur heard. Pulmonary/Chest: Effort normal and breath sounds normal. No respiratory distress.  He has no wheezes. He has no rales. He exhibits no tenderness.  Abdominal: Soft. Bowel sounds are normal. He exhibits no distension. There is tenderness. There is guarding. There is no rebound, no CVA tenderness and negative Murphy's sign.    Tenderness to palpation centrally, radiating left  Musculoskeletal: Normal range of motion. He exhibits no edema and no tenderness.  FROM to upper and lower extremities  Neurological: He is  alert and oriented to person, place, and time. No cranial nerve deficit.  Speech is clear and goal oriented, follows commands Sensation normal to light touch Moves extremities without ataxia, coordination intact Normal gait and balance Normal strength in upper and lower extremities bilaterally including dorsiflexion and plantar flexion, strong and equal grip strength   Skin: Skin is warm and dry. He is not diaphoretic. No erythema.  Psychiatric: He has a normal mood and affect.    ED Course  Procedures (including critical care time)  Labs Reviewed  CBC WITH DIFFERENTIAL - Abnormal; Notable for the following:    WBC 11.7 (*)    Neutrophils Relative % 86 (*)    Neutro Abs 10.1 (*)    Lymphocytes Relative 7 (*)    All other components within normal limits  COMPREHENSIVE METABOLIC PANEL - Abnormal; Notable for the following:    Chloride 94 (*)    Glucose, Bld 123 (*)    Total Protein 9.0 (*)    AST 95 (*)    ALT 56 (*)    Alkaline Phosphatase 162 (*)    All other components within normal limits  LIPASE, BLOOD - Abnormal; Notable for the following:    Lipase 1239 (*)    All other components within normal limits  URINALYSIS, ROUTINE W REFLEX MICROSCOPIC - Abnormal; Notable for the following:    Color, Urine AMBER (*)    APPearance CLOUDY (*)    Bilirubin Urine SMALL (*)    Ketones, ur >80 (*)    Protein, ur 30 (*)    All other components within normal limits  URINE MICROSCOPIC-ADD ON  POCT I-STAT TROPONIN I   Ct Abdomen Pelvis W Contrast  02/15/2013   *RADIOLOGY REPORT*  Clinical Data: Left upper quadrant abdominal pain, nausea and vomiting  CT ABDOMEN AND PELVIS WITH CONTRAST  Technique:  Multidetector CT imaging of the abdomen and pelvis was performed following the standard protocol during bolus administration of intravenous contrast.  Contrast: OMNIPAQUE IOHEXOL 300 MG/ML  SOLN  Comparison: No similar prior study is available for comparison.  Findings: Lung bases are  clear.  Mild reflux of contrast in the distal esophagus noted.  Hepatic hypodensity suggests steatosis. Sparing about the gallbladder fossa incidentally noted.  Adrenal glands, right kidney, and spleen are normal.  Left lower renal pole 2.8 cm cyst incidentally noted.  There is extensive peripancreatic fluid without rim enhancing fluid collection to suggest abscess formation.  No pancreatic ductal dilatation.  The pancreas enhances relatively homogeneously with mild inhomogeneity at the uncinate process but no definite fragment formation.  Bladder wall thickness at upper limits of normal concentrically. No pelvic lymphadenopathy.  Low density free pelvic fluid extends inferiorly from the pericolic gutters.  No bowel wall thickening or focal segmental dilatation.  No acute osseous finding.  IMPRESSION: Extensive peripancreatic fluid and minimally inhomogeneous enhancement of the uncinate process which may suggest pancreatitis in the appropriate clinical context.  No pancreatic ductal dilatation or evidence for rim enhancing fluid collection to suggest peripancreatic abscess.  Fatty liver.   Original  Report Authenticated By: Christiana Pellant, M.D.     1. Acute alcoholic pancreatitis   2. Alcohol abuse       MDM  Abdominal exam shows tenderness centrally and diffusely with some guarding. Lipase is 1239, AST 95, ALT 56, alk phos 162 indicate acute pancreatitis.   CT shows acute pancreatitis. No pancreatic ductal dilatation or evidence for rim enhancing fluid collection to suggest peripancreatic abscess. Discussed reasons for admission with pt who understands and agrees with plan. Keep on alcohol withdrawal precautions. Will call unassigned to admit.   1:35 AM Dr. Isidoro Donning will see the pt and admit to med surg. Temp admit orders placed.   Glade Nurse, PA-C 02/15/13 [id number]

## 2013-02-15 ENCOUNTER — Encounter (HOSPITAL_COMMUNITY): Payer: Self-pay | Admitting: *Deleted

## 2013-02-15 ENCOUNTER — Inpatient Hospital Stay (HOSPITAL_COMMUNITY): Payer: Self-pay

## 2013-02-15 ENCOUNTER — Emergency Department (HOSPITAL_COMMUNITY): Payer: Self-pay

## 2013-02-15 DIAGNOSIS — Z720 Tobacco use: Secondary | ICD-10-CM | POA: Diagnosis present

## 2013-02-15 DIAGNOSIS — D72829 Elevated white blood cell count, unspecified: Secondary | ICD-10-CM

## 2013-02-15 DIAGNOSIS — F1010 Alcohol abuse, uncomplicated: Secondary | ICD-10-CM

## 2013-02-15 DIAGNOSIS — E860 Dehydration: Secondary | ICD-10-CM

## 2013-02-15 DIAGNOSIS — K859 Acute pancreatitis without necrosis or infection, unspecified: Principal | ICD-10-CM | POA: Diagnosis present

## 2013-02-15 DIAGNOSIS — F17200 Nicotine dependence, unspecified, uncomplicated: Secondary | ICD-10-CM

## 2013-02-15 LAB — LIPID PANEL
HDL: 72 mg/dL (ref >39)
LDL Cholesterol: 50 mg/dL (ref 0–99)
Total CHOL/HDL Ratio: 1.8 RATIO
Triglycerides: 51 mg/dL (ref <150)
VLDL: 10 mg/dL (ref 0–40)

## 2013-02-15 LAB — CBC
MCHC: 34 g/dL (ref 30.0–36.0)
Platelets: 293 10*3/uL (ref 150–400)
RDW: 14.9 % (ref 11.5–15.5)
WBC: 16.1 10*3/uL — ABNORMAL HIGH (ref 4.0–10.5)

## 2013-02-15 LAB — RAPID URINE DRUG SCREEN, HOSP PERFORMED
Cocaine: NOT DETECTED
Opiates: NOT DETECTED
Tetrahydrocannabinol: POSITIVE — AB

## 2013-02-15 LAB — COMPREHENSIVE METABOLIC PANEL
AST: 60 U/L — ABNORMAL HIGH (ref 0–37)
Albumin: 3.6 g/dL (ref 3.5–5.2)
Alkaline Phosphatase: 132 U/L — ABNORMAL HIGH (ref 39–117)
Chloride: 99 mEq/L (ref 96–112)
Potassium: 4.3 mEq/L (ref 3.5–5.1)
Total Bilirubin: 1.1 mg/dL (ref 0.3–1.2)
Total Protein: 7.2 g/dL (ref 6.0–8.3)

## 2013-02-15 MED ORDER — IOHEXOL 300 MG/ML  SOLN
100.0000 mL | Freq: Once | INTRAMUSCULAR | Status: AC | PRN
  Administered 2013-02-15: 100 mL via INTRAVENOUS

## 2013-02-15 MED ORDER — LORAZEPAM 1 MG PO TABS: 1.0000 mg | ORAL_TABLET | Freq: Four times a day (QID) | ORAL | Status: AC | PRN

## 2013-02-15 MED ORDER — PROMETHAZINE HCL 25 MG/ML IJ SOLN
25.0000 mg | INTRAMUSCULAR | Status: AC
  Administered 2013-02-15: 25 mg via INTRAVENOUS
  Filled 2013-02-15: qty 1

## 2013-02-15 MED ORDER — ONDANSETRON HCL 4 MG PO TABS: 4.0000 mg | ORAL_TABLET | Freq: Four times a day (QID) | ORAL | Status: DC | PRN

## 2013-02-15 MED ORDER — ACETAMINOPHEN 650 MG RE SUPP: 650.0000 mg | Freq: Four times a day (QID) | RECTAL | Status: DC | PRN

## 2013-02-15 MED ORDER — ONDANSETRON HCL 4 MG/2ML IJ SOLN: 4.0000 mg | Freq: Three times a day (TID) | INTRAMUSCULAR | Status: DC | PRN

## 2013-02-15 MED ORDER — ENOXAPARIN SODIUM 40 MG/0.4ML ~~LOC~~ SOLN
40.0000 mg | Freq: Every day | SUBCUTANEOUS | Status: DC
  Administered 2013-02-15 – 2013-02-19 (×5): 40 mg via SUBCUTANEOUS
  Filled 2013-02-15 (×5): qty 0.4

## 2013-02-15 MED ORDER — ADULT MULTIVITAMIN W/MINERALS CH
1.0000 | ORAL_TABLET | Freq: Every day | ORAL | Status: DC
  Administered 2013-02-15 – 2013-02-19 (×4): 1 via ORAL
  Filled 2013-02-15 (×5): qty 1

## 2013-02-15 MED ORDER — THIAMINE HCL 100 MG/ML IJ SOLN
100.0000 mg | Freq: Every day | INTRAMUSCULAR | Status: DC
  Administered 2013-02-16: 100 mg via INTRAVENOUS
  Filled 2013-02-15 (×4): qty 1

## 2013-02-15 MED ORDER — VITAMIN B-1 100 MG PO TABS
100.0000 mg | ORAL_TABLET | Freq: Every day | ORAL | Status: DC
  Administered 2013-02-15 – 2013-02-19 (×4): 100 mg via ORAL
  Filled 2013-02-15 (×5): qty 1

## 2013-02-15 MED ORDER — ACETAMINOPHEN 325 MG PO TABS: 650.0000 mg | ORAL_TABLET | Freq: Four times a day (QID) | ORAL | Status: DC | PRN

## 2013-02-15 MED ORDER — HYDROMORPHONE HCL PF 1 MG/ML IJ SOLN
1.0000 mg | INTRAMUSCULAR | Status: DC | PRN
  Administered 2013-02-15 – 2013-02-18 (×12): 1 mg via INTRAVENOUS
  Filled 2013-02-15 (×16): qty 1

## 2013-02-15 MED ORDER — SODIUM CHLORIDE 0.9 % IV SOLN
INTRAVENOUS | Status: DC
  Administered 2013-02-15: 02:00:00 via INTRAVENOUS

## 2013-02-15 MED ORDER — SODIUM CHLORIDE 0.9 % IJ SOLN
3.0000 mL | Freq: Two times a day (BID) | INTRAMUSCULAR | Status: DC
  Administered 2013-02-17: 3 mL via INTRAVENOUS

## 2013-02-15 MED ORDER — FOLIC ACID 1 MG PO TABS
1.0000 mg | ORAL_TABLET | Freq: Every day | ORAL | Status: DC
  Administered 2013-02-15 – 2013-02-19 (×4): 1 mg via ORAL
  Filled 2013-02-15 (×5): qty 1

## 2013-02-15 MED ORDER — ONDANSETRON HCL 4 MG/2ML IJ SOLN
4.0000 mg | Freq: Four times a day (QID) | INTRAMUSCULAR | Status: DC | PRN
  Administered 2013-02-15: 4 mg via INTRAVENOUS
  Filled 2013-02-15: qty 2

## 2013-02-15 MED ORDER — MORPHINE SULFATE 4 MG/ML IJ SOLN
4.0000 mg | INTRAMUSCULAR | Status: DC | PRN
  Administered 2013-02-17 – 2013-02-18 (×2): 4 mg via INTRAVENOUS
  Filled 2013-02-15 (×2): qty 1

## 2013-02-15 MED ORDER — SODIUM CHLORIDE 0.9 % IV SOLN
INTRAVENOUS | Status: DC
  Administered 2013-02-15 – 2013-02-16 (×4): via INTRAVENOUS

## 2013-02-15 MED ORDER — LORAZEPAM 2 MG/ML IJ SOLN: 1.0000 mg | Freq: Four times a day (QID) | INTRAMUSCULAR | Status: AC | PRN

## 2013-02-15 MED ORDER — MORPHINE SULFATE 4 MG/ML IJ SOLN: 4.0000 mg | INTRAMUSCULAR | Status: AC

## 2013-02-15 NOTE — Progress Notes (Signed)
[facility] PROGRESS NOTE  Antonio TOROSYAN ZOX:[id number] DOB: July 11, 1981 DOA: 02/14/2013 PCP: No PCP Per Patient  Assessment/Plan:  Acute pancreatitis secondary to heavy alcohol abuse  - Admit to inpatient,aggressive IV fluid hydration, pain control, antiemetics  - triglycerides 51 -Abdominal ultrasound--negative gallbladder; hypoechoic area on the pancreatic head correlates with extensive peripancreatic fluid seen on CT and 3 cm mass in the left kidney correlates with left renal cyst seen on CT of the abdomen  -CT abdomen extensive peripancreatic fluid without rim enhancing fluid to suggest abscess -No vomiting today--patient wanted to try clear liquids -Continue IV fluids Alcohol abuse - patient was strongly counseled alcohol cessation  - His last drink was at 4 AM 02/14/13  - Will obtain alcohol level-->neg -place on alcohol withdrawal protocol  Nicotine abuse  - Patient was counseled smoking cessation, he declined nicotine patch  Transaminitis :  Typical of alcoholic pattern, CT abdomen and pelvis shows fatty liver  DVT prophylaxis:Lovenox  CODE STATUS: Full code  Family Communication:   fiancee at beside Disposition Plan:   Home when medically stable          Procedures/Studies: US Abdomen Complete  02/15/2013   *RADIOLOGY REPORT*  Clinical Data:  Acute pancreatitis  ABDOMINAL ULTRASOUND COMPLETE  Comparison:  CT earlier today  Findings:  Gallbladder:  No gallstones, gallbladder wall thickening, or pericholecystic fluid.  Common Bile Duct:  Within normal limits in caliber.  Liver: The liver is diffusely increased in echogenicity compatible with diffuse hepatic steatosis as noted on the accompanying CT.  A small amount of fluid between the liver and right kidney is noted. No focal liver mass.  IVC:  Appears normal.  Pancreas:  The head of the pancreas is prominent.  There is a suspicious hypoechoic area within the head of the pancreas.  Mass is not excluded. Please refer to  image #4.  Spleen:  Within normal limits in size and echotexture.  Right kidney:  Normal in size and parenchymal echogenicity.  No evidence of mass or hydronephrosis.  Left kidney:  11.7 meters in length.  No hydronephrosis.  Normal cortical echogenicity.  There is a 3.2 x 2.9 cm heterogeneous and hyperechoic mass corresponding to the abnormality on CT.  There is some through transmission.  This suggests a cystic or complex cystic nature.  Pre and postcontrast imaging on the CT was not performed.  Abdominal Aorta:  Maximal aortic diameter is 2.5 cm.  IMPRESSION: The pancreatic head is prominent compatible with the inflammatory process noted on CT.  There is a hypoechoic area and soft tissue mass in the pancreatic head is not excluded.  Short-term follow-up imaging is recommended to ensure resolution of this finding.  If abnormality persist, MRI can be performed to further characterize.  3.0 cm mass in the lower pole of the left kidney.  MRI is recommended with and without contrast.  Small amount of ascites.  Diffuse hepatic steatosis.   Original Report Authenticated By: Jolaine Click, M.D.   Ct Abdomen Pelvis W Contrast  02/15/2013   *RADIOLOGY REPORT*  Clinical Data: Left upper quadrant abdominal pain, nausea and vomiting  CT ABDOMEN AND PELVIS WITH CONTRAST  Technique:  Multidetector CT imaging of the abdomen and pelvis was performed following the standard protocol during bolus administration of intravenous contrast.  Contrast: OMNIPAQUE IOHEXOL 300 MG/ML  SOLN  Comparison: No similar prior study is available for comparison.  Findings: Lung bases are clear.  Mild reflux of contrast in the distal esophagus noted.  Hepatic hypodensity suggests steatosis. Sparing about the gallbladder fossa incidentally noted.  Adrenal glands, right kidney, and spleen are normal.  Left lower renal pole 2.8 cm cyst incidentally noted.  There is extensive peripancreatic fluid without rim enhancing fluid collection to suggest  abscess formation.  No pancreatic ductal dilatation.  The pancreas enhances relatively homogeneously with mild inhomogeneity at the uncinate process but no definite fragment formation.  Bladder wall thickness at upper limits of normal concentrically. No pelvic lymphadenopathy.  Low density free pelvic fluid extends inferiorly from the pericolic gutters.  No bowel wall thickening or focal segmental dilatation.  No acute osseous finding.  IMPRESSION: Extensive peripancreatic fluid and minimally inhomogeneous enhancement of the uncinate process which may suggest pancreatitis in the appropriate clinical context.  No pancreatic ductal dilatation or evidence for rim enhancing fluid collection to suggest peripancreatic abscess.  Fatty liver.   Original Report Authenticated By: Christiana Pellant, M.D.         Subjective: Patient states the vomiting has improved. Denies any fevers, chills, chest discomfort, shortness of breath, abdominal pain, dysuria, hematuria, headache.  Objective: Filed Vitals:   02/15/13 0144 02/15/13 0200 02/15/13 1002 02/15/13 1510  BP: 146/93 140/89 123/83 137/86  Pulse: 90 99 61 62  Temp:  98 F (36.7 C) 98.7 F (37.1 C) 98.5 F (36.9 C)  TempSrc:  Oral Oral Oral  Resp:  18 16 16   Weight:  67.7 kg (149 lb 4 oz)    SpO2:  99% 99% 98%    Intake/Output Summary (Last 24 hours) at 02/15/13 1755 Last data filed at 02/15/13 0808  Gross per 24 hour  Intake      0 ml  Output    200 ml  Net   -200 ml   Weight change:  Exam:   General:  Pt is alert, follows commands appropriately, not in acute distress  HEENT: No icterus, No thrush, [place]/AT  Cardiovascular: RRR, S1/S2, no rubs, no gallops  Respiratory: CTA bilaterally, no wheezing, no crackles, no rhonchi  Abdomen: Soft/+BS, epigastric tenderness, non distended, no guarding  Extremities: No edema, No lymphangitis, No petechiae, No rashes, no synovitis  Data Reviewed: Basic Metabolic Panel:  Recent Labs Lab  02/14/13 2030 02/15/13 0520  NA 136 135  K 4.2 4.3  CL 94* 99  CO2 29 26  GLUCOSE 123* 85  BUN 9 8  CREATININE 0.92 0.84  CALCIUM 9.6 8.1*  MG 2.0  --    Liver Function Tests:  Recent Labs Lab 02/14/13 2030 02/15/13 0520  AST 95* 60*  ALT 56* 39  ALKPHOS 162* 132*  BILITOT 0.9 1.1  PROT 9.0* 7.2  ALBUMIN 4.7 3.6    Recent Labs Lab 02/14/13 2030 02/15/13 0520  LIPASE 1239* 1859*   No results found for this basename: AMMONIA,  in the last 168 hours CBC:  Recent Labs Lab 02/14/13 2030 02/15/13 0520  WBC 11.7* 16.1*  NEUTROABS 10.1*  --   HGB 16.7 14.7  HCT 47.2 43.2  MCV 86.8 87.3  PLT 298 293   Cardiac Enzymes: No results found for this basename: CKTOTAL, CKMB, CKMBINDEX, TROPONINI,  in the last 168 hours BNP: No components found with this basename: POCBNP,  CBG: No results found for this basename: GLUCAP,  in the last 168 hours  No results found for this or any previous visit (from the past 240 hour(s)).   Scheduled Meds: . enoxaparin (LOVENOX) injection  40 mg Subcutaneous Daily  . folic acid  1 mg Oral  Daily  .  morphine injection  4 mg Intravenous STAT  . multivitamin with minerals  1 tablet Oral Daily  . sodium chloride  3 mL Intravenous Q12H  . thiamine  100 mg Oral Daily   Or  . thiamine  100 mg Intravenous Daily   Continuous Infusions: . sodium chloride 125 mL/hr at 02/15/13 1707     Jalien Weakland, DO  [facility] Pager [phone]  If 7PM-7AM, please contact night-coverage [email or web address] Password [email or web address] 02/15/2013, 5:55 PM   LOS: 1 day

## 2013-02-15 NOTE — ED Provider Notes (Signed)
Medical screening examination/treatment/procedure(s) were performed by non-physician practitioner and as supervising physician I was immediately available for consultation/collaboration. Desira Alessandrini, MD, FACEP   Eyden Dobie L Morgana Rowley, MD 02/15/13 1236 

## 2013-02-15 NOTE — H&P (Signed)
History and Physical       Hospital Admission Note Date: 02/15/2013  Patient name: Antonio Alvarez Medical record number: [id number] Date of birth: Apr 02, 1981 Age: 32 y.o. Gender: male PCP: No PCP Per Patient    Chief Complaint:  Abdominal pain since yesterday morning  HPI: Patient is a 32 year old African American male with history of heavy alcohol abuse, nicotine abuse presented to the ED with complaint of abdominal pain. Patient states that he works third shift and woke up around 12 noon with severe abdominal pain. Patient described the pain as severe 10/10 in intensity, intermittent, cramping, epigastric and radiating to the left side towards the back. The pain was associated with intractable nausea and vomiting several times yesterday. Patient endorses having chills but no fevers, shortness of breath, diarrhea. He is a heavy drinker and drinks a gallon on the fifth of vodka every day, last drink at 4 AM yesterday. Lipase in the ED was 1239 with transaminitis typical of alcoholic pattern. This is his first episode of pancreatitis   Review of Systems:  Constitutional: Denies fever, chills, diaphoresis, poor appetite and fatigue.  HEENT: Denies photophobia, eye pain, redness, hearing loss, ear pain, congestion, sore throat, rhinorrhea, sneezing, mouth sores, trouble swallowing, neck pain, neck stiffness and tinnitus.   Respiratory: Denies SOB, DOE, cough, chest tightness,  and wheezing.   Cardiovascular: Denies chest pain, palpitations and leg swelling.  Gastrointestinal:  please see history of present illness  Genitourinary: Denies dysuria, urgency, frequency, hematuria, flank pain and difficulty urinating.  Musculoskeletal: Denies myalgias, joint swelling, arthralgias and gait problem.  Skin: Denies pallor, rash and wound.  Neurological: Denies dizziness, seizures, syncope, weakness, light-headedness, numbness and headaches.   Hematological: Denies adenopathy. Easy bruising, personal or family bleeding history  Psychiatric/Behavioral: Denies suicidal ideation, mood changes, confusion, nervousness, sleep disturbance and agitation  Past Medical History: Past Medical History  Diagnosis Date  . Abscess     hx of abscess on neck  . Alcohol abuse   . Cellulitis   . Hyponatremia   . Leukocytosis    Past Surgical History  Procedure Laterality Date  . Abscess removed from neck      Medications: Prior to Admission medications   Medication Sig Start Date End Date Taking? Authorizing Provider  Ibuprofen-Diphenhydramine Cit (ADVIL PM PO) Take 1 tablet by mouth once. For sleep   Yes Historical Provider, MD    Allergies:  No Known Allergies  Social History:  reports that he has been smoking Cigarettes.  He has a 7.5 pack-year smoking history. He has never used smokeless tobacco. He reports that he drinks about 21.0 ounces of alcohol per week. He reports that he uses illicit drugs (Marijuana).  Family History: Family History  Problem Relation Age of Onset  . Alcohol abuse Father     Physical Exam: Blood pressure 146/93, pulse 90, temperature 98.4 F (36.9 C), temperature source Oral, resp. rate 16, SpO2 98.00%. General: Alert, awake, oriented x3, in no acute distress. HEENT: normocephalic, atraumatic, anicteric sclera, pink conjunctiva, pupils equal and reactive to light and accomodation, oropharynx clear Neck: supple, no masses or lymphadenopathy, no goiter, no bruits  Heart: Regular rate and rhythm, without murmurs, rubs or gallops. Lungs: Clear to auscultation bilaterally, no wheezing, rales or rhonchi. Abdomen: Soft, epigastric tender and left upper quadrant , nondistended, positive bowel sounds, no masses. Extremities: No clubbing, cyanosis or edema with positive pedal pulses. Neuro: Grossly intact, no focal neurological deficits, strength 5/5 upper and lower extremities bilaterally Psych: alert  and  oriented x 3, normal mood and affect Skin: no rashes or lesions, warm and dry   LABS on Admission:  Basic Metabolic Panel:  Recent Labs Lab 02/14/13 2030  NA 136  K 4.2  CL 94*  CO2 29  GLUCOSE 123*  BUN 9  CREATININE 0.92  CALCIUM 9.6  MG 2.0   Liver Function Tests:  Recent Labs Lab 02/14/13 2030  AST 95*  ALT 56*  ALKPHOS 162*  BILITOT 0.9  PROT 9.0*  ALBUMIN 4.7    Recent Labs Lab 02/14/13 2030  LIPASE 1239*   No results found for this basename: AMMONIA,  in the last 168 hours CBC:  Recent Labs Lab 02/14/13 2030  WBC 11.7*  NEUTROABS 10.1*  HGB 16.7  HCT 47.2  MCV 86.8  PLT 298   Cardiac Enzymes: No results found for this basename: CKTOTAL, CKMB, CKMBINDEX, TROPONINI,  in the last 168 hours BNP: No components found with this basename: POCBNP,  CBG: No results found for this basename: GLUCAP,  in the last 168 hours   Radiological Exams on Admission: Ct Abdomen Pelvis W Contrast  02/15/2013   *RADIOLOGY REPORT*  Clinical Data: Left upper quadrant abdominal pain, nausea and vomiting  CT ABDOMEN AND PELVIS WITH CONTRAST  Technique:  Multidetector CT imaging of the abdomen and pelvis was performed following the standard protocol during bolus administration of intravenous contrast.  Contrast: OMNIPAQUE IOHEXOL 300 MG/ML  SOLN  Comparison: No similar prior study is available for comparison.  Findings: Lung bases are clear.  Mild reflux of contrast in the distal esophagus noted.  Hepatic hypodensity suggests steatosis. Sparing about the gallbladder fossa incidentally noted.  Adrenal glands, right kidney, and spleen are normal.  Left lower renal pole 2.8 cm cyst incidentally noted.  There is extensive peripancreatic fluid without rim enhancing fluid collection to suggest abscess formation.  No pancreatic ductal dilatation.  The pancreas enhances relatively homogeneously with mild inhomogeneity at the uncinate process but no definite fragment formation.   Bladder wall thickness at upper limits of normal concentrically. No pelvic lymphadenopathy.  Low density free pelvic fluid extends inferiorly from the pericolic gutters.  No bowel wall thickening or focal segmental dilatation.  No acute osseous finding.  IMPRESSION: Extensive peripancreatic fluid and minimally inhomogeneous enhancement of the uncinate process which may suggest pancreatitis in the appropriate clinical context.  No pancreatic ductal dilatation or evidence for rim enhancing fluid collection to suggest peripancreatic abscess.  Fatty liver.   Original Report Authenticated By: Christiana Pellant, M.D.    Assessment/Plan Principal Problem:   Acute pancreatitis likely secondary to heavy alcohol abuse - Admit to inpatient, n.p.o., aggressive IV fluid hydration, pain control, antiemetics - Will also obtain alcohol level, lipid panel to rule out any hypertriglyceridemia  Active Problems:   Alcohol abuse- patient was strongly counseled alcohol cessation - His last drink was at 4 AM yesterday - Will obtain alcohol level, place on alcohol withdrawal protocol    Nicotine abuse - Patient was counseled smoking cessation, he declined nicotine patch   Transaminitis : Typical of alcoholic pattern, CT abdomen and pelvis shows fatty liver   DVT prophylaxis:Lovenox   CODE STATUS: Full code   Further plan will depend as patient's clinical course evolves and further radiologic and laboratory data become available.   Time Spent on Admission: 45 minutes   Landi Biscardi M.D. [facility] 02/15/2013, 1:53 AM Pager: [phone]  If 7PM-7AM, please contact night-coverage [email or web address] Password [email or web address]

## 2013-02-16 LAB — COMPREHENSIVE METABOLIC PANEL
Albumin: 3.1 g/dL — ABNORMAL LOW (ref 3.5–5.2)
Alkaline Phosphatase: 136 U/L — ABNORMAL HIGH (ref 39–117)
BUN: 4 mg/dL — ABNORMAL LOW (ref 6–23)
CO2: 25 mEq/L (ref 19–32)
Chloride: 100 mEq/L (ref 96–112)
Creatinine, Ser: 0.86 mg/dL (ref 0.50–1.35)
GFR calc non Af Amer: >90 mL/min (ref >90)
Potassium: 4.2 mEq/L (ref 3.5–5.1)
Sodium: 134 mEq/L — ABNORMAL LOW (ref 135–145)
Total Bilirubin: 0.9 mg/dL (ref 0.3–1.2)

## 2013-02-16 LAB — CBC
MCH: 30.2 pg (ref 26.0–34.0)
MCV: 87.6 fL (ref 78.0–100.0)
Platelets: 267 10*3/uL (ref 150–400)
RDW: 14.9 % (ref 11.5–15.5)
WBC: 16.4 10*3/uL — ABNORMAL HIGH (ref 4.0–10.5)

## 2013-02-16 MED ORDER — KCL IN DEXTROSE-NACL 20-5-0.45 MEQ/L-%-% IV SOLN
INTRAVENOUS | Status: DC
  Administered 2013-02-16 – 2013-02-19 (×7): via INTRAVENOUS
  Filled 2013-02-16 (×10): qty 1000

## 2013-02-16 NOTE — Progress Notes (Signed)
[facility] PROGRESS NOTE  Antonio Alvarez WUX:[id number] DOB: [birth year] DOA: 02/14/2013 PCP: No PCP Per Patient  Assessment/Plan: Acute pancreatitis secondary to heavy alcohol abuse  - continue supportive therapy with aggressive IV fluid hydration, pain control, antiemetics  - triglycerides 51  -Abdominal ultrasound--negative gallbladder; hypoechoic area on the pancreatic head correlates with extensive peripancreatic fluid seen on CT and 3 cm mass in the left kidney correlates with left renal cyst seen on CT of the abdomen  -CT abdomen extensive peripancreatic fluid without rim enhancing fluid to suggest abscess  -Lipase up today after patient had clear liquid diet.  Will make npo and monitor lipase levels. Patient still complaining of abdominal pain.  Alcohol abuse  - patient was strongly counseled alcohol cessation  - His last drink was at 4 AM 02/14/13  -placed on alcohol withdrawal protocol   Nicotine abuse  - Patient was counseled smoking cessation, he declined nicotine patch   Transaminitis :  Typical of alcoholic pattern, CT abdomen and pelvis shows fatty liver  - resolved 02/16/13  DVT prophylaxis:Lovenox   CODE STATUS: Full code  Family Communication: no family at bedside. Disposition Plan: Home when medically stable     Consultants:  none  Procedures:  none  Antibiotics:  none   HPI/Subjective: Pt still having abdominal discomfort. Was concerned about his Kidney this am.  Objective: Filed Vitals:   02/15/13 1757 02/15/13 2126 02/16/13 0543 02/16/13 0925  BP: 121/72 114/67 115/63 122/84  Pulse: 61 68 71 75  Temp: 99 F (37.2 C) 99.7 F (37.6 C) 99.2 F (37.3 C) 99.1 F (37.3 C)  TempSrc: Oral Oral Oral Oral  Resp: 16 16 16 17   Height:  5\' 11"  (1.803 m)    Weight:  69.4 kg (153 lb)    SpO2: 100% 99% 97% 99%    Intake/Output Summary (Last 24 hours) at 02/16/13 1054 Last data filed at 02/16/13 0258  Gross per 24 hour  Intake   1740 ml  Output     425 ml  Net   1315 ml   Filed Weights   02/15/13 0200 02/15/13 2126  Weight: 67.7 kg (149 lb 4 oz) 69.4 kg (153 lb)    Exam:   General:  Pt in NAD, Alert and awake  Cardiovascular: RRR, no MRG  Respiratory: CTA BL, no wheezes  Abdomen: tenderness at epigastric area, non distended  Musculoskeletal: no cyanosis or clubbing   Data Reviewed: Basic Metabolic Panel:  Recent Labs Lab 02/14/13 2030 02/15/13 0520 02/16/13 0550  NA 136 135 134*  K 4.2 4.3 4.2  CL 94* 99 100  CO2 29 26 25   GLUCOSE 123* 85 78  BUN 9 8 4*  CREATININE 0.92 0.84 0.86  CALCIUM 9.6 8.1* 8.4  MG 2.0  --   --    Liver Function Tests:  Recent Labs Lab 02/14/13 2030 02/15/13 0520 02/16/13 0550  AST 95* 60* 32  ALT 56* 39 25  ALKPHOS 162* 132* 136*  BILITOT 0.9 1.1 0.9  PROT 9.0* 7.2 6.9  ALBUMIN 4.7 3.6 3.1*    Recent Labs Lab 02/14/13 2030 02/15/13 0520  LIPASE 1239* 1859*   No results found for this basename: AMMONIA,  in the last 168 hours CBC:  Recent Labs Lab 02/14/13 2030 02/15/13 0520 02/16/13 0550  WBC 11.7* 16.1* 16.4*  NEUTROABS 10.1*  --   --   HGB 16.7 14.7 14.9  HCT 47.2 43.2 43.2  MCV 86.8 87.3 87.6  PLT 298 293 267  Cardiac Enzymes: No results found for this basename: CKTOTAL, CKMB, CKMBINDEX, TROPONINI,  in the last 168 hours BNP (last 3 results) No results found for this basename: PROBNP,  in the last 8760 hours CBG: No results found for this basename: GLUCAP,  in the last 168 hours  No results found for this or any previous visit (from the past 240 hour(s)).   Studies: US Abdomen Complete  02/15/2013   *RADIOLOGY REPORT*  Clinical Data:  Acute pancreatitis  ABDOMINAL ULTRASOUND COMPLETE  Comparison:  CT earlier today  Findings:  Gallbladder:  No gallstones, gallbladder wall thickening, or pericholecystic fluid.  Common Bile Duct:  Within normal limits in caliber.  Liver: The liver is diffusely increased in echogenicity compatible with diffuse  hepatic steatosis as noted on the accompanying CT.  A small amount of fluid between the liver and right kidney is noted. No focal liver mass.  IVC:  Appears normal.  Pancreas:  The head of the pancreas is prominent.  There is a suspicious hypoechoic area within the head of the pancreas.  Mass is not excluded. Please refer to image #4.  Spleen:  Within normal limits in size and echotexture.  Right kidney:  Normal in size and parenchymal echogenicity.  No evidence of mass or hydronephrosis.  Left kidney:  11.7 meters in length.  No hydronephrosis.  Normal cortical echogenicity.  There is a 3.2 x 2.9 cm heterogeneous and hyperechoic mass corresponding to the abnormality on CT.  There is some through transmission.  This suggests a cystic or complex cystic nature.  Pre and postcontrast imaging on the CT was not performed.  Abdominal Aorta:  Maximal aortic diameter is 2.5 cm.  IMPRESSION: The pancreatic head is prominent compatible with the inflammatory process noted on CT.  There is a hypoechoic area and soft tissue mass in the pancreatic head is not excluded.  Short-term follow-up imaging is recommended to ensure resolution of this finding.  If abnormality persist, MRI can be performed to further characterize.  3.0 cm mass in the lower pole of the left kidney.  MRI is recommended with and without contrast.  Small amount of ascites.  Diffuse hepatic steatosis.   Original Report Authenticated By: Jolaine Click, M.D.   Ct Abdomen Pelvis W Contrast  02/15/2013   *RADIOLOGY REPORT*  Clinical Data: Left upper quadrant abdominal pain, nausea and vomiting  CT ABDOMEN AND PELVIS WITH CONTRAST  Technique:  Multidetector CT imaging of the abdomen and pelvis was performed following the standard protocol during bolus administration of intravenous contrast.  Contrast: OMNIPAQUE IOHEXOL 300 MG/ML  SOLN  Comparison: No similar prior study is available for comparison.  Findings: Lung bases are clear.  Mild reflux of contrast in  the distal esophagus noted.  Hepatic hypodensity suggests steatosis. Sparing about the gallbladder fossa incidentally noted.  Adrenal glands, right kidney, and spleen are normal.  Left lower renal pole 2.8 cm cyst incidentally noted.  There is extensive peripancreatic fluid without rim enhancing fluid collection to suggest abscess formation.  No pancreatic ductal dilatation.  The pancreas enhances relatively homogeneously with mild inhomogeneity at the uncinate process but no definite fragment formation.  Bladder wall thickness at upper limits of normal concentrically. No pelvic lymphadenopathy.  Low density free pelvic fluid extends inferiorly from the pericolic gutters.  No bowel wall thickening or focal segmental dilatation.  No acute osseous finding.  IMPRESSION: Extensive peripancreatic fluid and minimally inhomogeneous enhancement of the uncinate process which may suggest pancreatitis in the appropriate clinical  context.  No pancreatic ductal dilatation or evidence for rim enhancing fluid collection to suggest peripancreatic abscess.  Fatty liver.   Original Report Authenticated By: Christiana Pellant, M.D.    Scheduled Meds: . enoxaparin (LOVENOX) injection  40 mg Subcutaneous Daily  . folic acid  1 mg Oral Daily  . multivitamin with minerals  1 tablet Oral Daily  . sodium chloride  3 mL Intravenous Q12H  . thiamine  100 mg Oral Daily   Or  . thiamine  100 mg Intravenous Daily   Continuous Infusions: . dextrose 5 % and 0.45 % NaCl with KCl 20 mEq/L      Principal Problem:   Acute pancreatitis Active Problems:   Alcohol abuse   Nicotine abuse    Time spent: > 35 minutes    Penny Pia  [facility] Pager [phone] If 7PM-7AM, please contact night-coverage at [email or web address], password [facility] 02/16/2013, 10:54 AM  LOS: 2 days

## 2013-02-17 LAB — LIPASE, BLOOD: Lipase: 191 U/L — ABNORMAL HIGH (ref 11–59)

## 2013-02-17 NOTE — Progress Notes (Signed)
[facility] PROGRESS NOTE  Antonio MILLETT Alvarez:[id number] DOB: 06-18-1981 DOA: 02/14/2013 PCP: No PCP Per Patient  HPI/Subjective: Have 5/10 abdominal pain. BM since admission.  Assessment/Plan:  Acute pancreatitis secondary to heavy alcohol abuse  - continue supportive therapy with aggressive IV fluid hydration, pain control, antiemetics  - triglycerides 51  -Abdominal ultrasound-negative gallbladder; hypoechoic area on the pancreatic head correlates with extensive peripancreatic fluid seen on CT and 3 cm mass in the left kidney correlates with left renal cyst seen on CT of the abdomen  -CT abdomen extensive peripancreatic fluid without rim enhancing fluid to suggest abscess  -Lipase improved today, continue NPO status, OK for ice chips  Alcohol abuse  - patient was strongly counseled alcohol cessation  - His last drink was at 4 AM 02/14/13  - Placed on alcohol withdrawal protocol   Nicotine abuse  - Patient was counseled smoking cessation, he declined nicotine patch   Transaminitis :  Typical of alcoholic pattern, CT abdomen and pelvis shows fatty liver  - resolved 02/16/13  DVT prophylaxis:Lovenox   CODE STATUS: Full code  Family Communication: no family at bedside. Disposition Plan: Home when medically stable     Consultants:  none  Procedures:  none  Antibiotics:  none     Objective: Filed Vitals:   02/16/13 1400 02/16/13 1800 02/16/13 2041 02/17/13 0509  BP: 119/75 117/69 117/79 128/80  Pulse: 75  79 74  Temp: 99 F (37.2 C) 98.5 F (36.9 C) 99.7 F (37.6 C) 99.2 F (37.3 C)  TempSrc: Oral Oral Oral Oral  Resp: 20 18 18 18   Height:      Weight:   69.4 kg (153 lb)   SpO2: 100% 100% 94% 92%    Intake/Output Summary (Last 24 hours) at 02/17/13 0959 Last data filed at 02/17/13 0854  Gross per 24 hour  Intake    230 ml  Output   1100 ml  Net   -870 ml   Filed Weights   02/15/13 0200 02/15/13 2126 02/16/13 2041  Weight: 67.7 kg (149 lb 4 oz)  69.4 kg (153 lb) 69.4 kg (153 lb)    Exam:   General:  Pt in NAD, Alert and awake  Cardiovascular: RRR, no MRG  Respiratory: CTA BL, no wheezes  Abdomen: tenderness at epigastric area, non distended  Musculoskeletal: no cyanosis or clubbing   Data Reviewed: Basic Metabolic Panel:  Recent Labs Lab 02/14/13 2030 02/15/13 0520 02/16/13 0550  NA 136 135 134*  K 4.2 4.3 4.2  CL 94* 99 100  CO2 29 26 25   GLUCOSE 123* 85 78  BUN 9 8 4*  CREATININE 0.92 0.84 0.86  CALCIUM 9.6 8.1* 8.4  MG 2.0  --   --    Liver Function Tests:  Recent Labs Lab 02/14/13 2030 02/15/13 0520 02/16/13 0550  AST 95* 60* 32  ALT 56* 39 25  ALKPHOS 162* 132* 136*  BILITOT 0.9 1.1 0.9  PROT 9.0* 7.2 6.9  ALBUMIN 4.7 3.6 3.1*    Recent Labs Lab 02/14/13 2030 02/15/13 0520 02/17/13 0520  LIPASE 1239* 1859* 191*   No results found for this basename: AMMONIA,  in the last 168 hours CBC:  Recent Labs Lab 02/14/13 2030 02/15/13 0520 02/16/13 0550  WBC 11.7* 16.1* 16.4*  NEUTROABS 10.1*  --   --   HGB 16.7 14.7 14.9  HCT 47.2 43.2 43.2  MCV 86.8 87.3 87.6  PLT 298 293 267   Cardiac Enzymes: No results found for  this basename: CKTOTAL, CKMB, CKMBINDEX, TROPONINI,  in the last 168 hours BNP (last 3 results) No results found for this basename: PROBNP,  in the last 8760 hours CBG: No results found for this basename: GLUCAP,  in the last 168 hours  No results found for this or any previous visit (from the past 240 hour(s)).   Studies: No results found.  Scheduled Meds: . enoxaparin (LOVENOX) injection  40 mg Subcutaneous Daily  . folic acid  1 mg Oral Daily  . multivitamin with minerals  1 tablet Oral Daily  . sodium chloride  3 mL Intravenous Q12H  . thiamine  100 mg Oral Daily   Or  . thiamine  100 mg Intravenous Daily   Continuous Infusions: . dextrose 5 % and 0.45 % NaCl with KCl 20 mEq/L 100 mL/hr at 02/16/13 2243    Principal Problem:   Acute  pancreatitis Active Problems:   Alcohol abuse   Nicotine abuse    Time spent: > 35 minutes    [facility]  [facility] Pager [phone] 7PM-7AM, please contact night-coverage at [email or web address], password [facility] 02/17/2013, 9:59 AM  LOS: 3 days

## 2013-02-18 LAB — CBC
HCT: 41.7 % (ref 39.0–52.0)
Hemoglobin: 14.2 g/dL (ref 13.0–17.0)
WBC: 9.2 10*3/uL (ref 4.0–10.5)

## 2013-02-18 LAB — COMPREHENSIVE METABOLIC PANEL
Albumin: 3.1 g/dL — ABNORMAL LOW (ref 3.5–5.2)
BUN: 4 mg/dL — ABNORMAL LOW (ref 6–23)
Calcium: 9 mg/dL (ref 8.4–10.5)
GFR calc Af Amer: >90 mL/min (ref >90)
Glucose, Bld: 102 mg/dL — ABNORMAL HIGH (ref 70–99)
Sodium: 135 mEq/L (ref 135–145)
Total Protein: 7.1 g/dL (ref 6.0–8.3)

## 2013-02-18 MED ORDER — PANTOPRAZOLE SODIUM 40 MG PO TBEC
40.0000 mg | DELAYED_RELEASE_TABLET | Freq: Every day | ORAL | Status: DC
  Administered 2013-02-18 – 2013-02-19 (×2): 40 mg via ORAL
  Filled 2013-02-18 (×2): qty 1

## 2013-02-18 MED ORDER — OXYCODONE-ACETAMINOPHEN 5-325 MG PO TABS
1.0000 | ORAL_TABLET | Freq: Four times a day (QID) | ORAL | Status: DC | PRN
  Administered 2013-02-18 – 2013-02-19 (×4): 1 via ORAL
  Filled 2013-02-18 (×4): qty 1

## 2013-02-18 MED ORDER — MORPHINE SULFATE 4 MG/ML IJ SOLN: 1.0000 mg | INTRAMUSCULAR | Status: DC | PRN

## 2013-02-18 NOTE — Progress Notes (Signed)
[facility] PROGRESS NOTE  Antonio Alvarez [id number] DOB: 1980-10-29 DOA: 02/14/2013 PCP: No PCP Per Patient  HPI/Subjective: Have 7/10 abdominal pain. Pain is about the same as yesterday.  Assessment/Plan:  Acute pancreatitis secondary to heavy alcohol abuse  -Continue supportive therapy with aggressive IV fluid hydration, pain control, antiemetics  -Abdominal ultrasound and CT scan showed an uncomplicated acute pancreatitis -CT abdomen extensive peripancreatic fluid without rim enhancing fluid to suggest abscess  -Lipase improved today to 188, patient still have significant pain continue n.p.o./IV since. -I will check him later, if the pain improved him I put him on clear liquids.  Alcohol abuse  - Patient was strongly counseled about alcohol cessation  - His last drink was at 4 AM 02/14/13  - Placed on alcohol withdrawal protocol   Nicotine abuse  - Patient was counseled smoking cessation, he declined nicotine patch   Transaminitis :  Typical of alcoholic pattern, CT abdomen and pelvis shows fatty liver  - resolved 02/16/13  Leukocytosis -Secondary to the acute pancreatitis and probably low grade SIRS. -This is resolved.  DVT prophylaxis:Lovenox   CODE STATUS: Full code  Family Communication: no family at bedside. Disposition Plan: Home when medically stable     Consultants:  none  Procedures:  none  Antibiotics:  none     Objective: Filed Vitals:   02/16/13 2041 02/17/13 0509 02/17/13 1303 02/17/13 2100  BP: 117/79 128/80 130/88 143/91  Pulse: 79 74 72 75  Temp: 99.7 F (37.6 C) 99.2 F (37.3 C) 98.1 F (36.7 C) 98.7 F (37.1 C)  TempSrc: Oral Oral Oral Oral  Resp: 18 18 18 20   Height:      Weight: 69.4 kg (153 lb)   67.5 kg (148 lb 13 oz)  SpO2: 94% 92% 93%     Intake/Output Summary (Last 24 hours) at 02/18/13 0947 Last data filed at 02/18/13 0300  Gross per 24 hour  Intake   2000 ml  Output      0 ml  Net   2000 ml   Filed  Weights   02/15/13 2126 02/16/13 2041 02/17/13 2100  Weight: 69.4 kg (153 lb) 69.4 kg (153 lb) 67.5 kg (148 lb 13 oz)    Exam:   General:  Pt in NAD, Alert and awake  Cardiovascular: RRR, no MRG  Respiratory: CTA BL, no wheezes  Abdomen: tenderness at epigastric area, non distended  Musculoskeletal: no cyanosis or clubbing   Data Reviewed: Basic Metabolic Panel:  Recent Labs Lab 02/14/13 2030 02/15/13 0520 02/16/13 0550 02/18/13 0540  NA 136 135 134* 135  K 4.2 4.3 4.2 3.6  CL 94* 99 100 99  CO2 29 26 25 27   GLUCOSE 123* 85 78 102*  BUN 9 8 4* 4*  CREATININE 0.92 0.84 0.86 0.77  CALCIUM 9.6 8.1* 8.4 9.0  MG 2.0  --   --   --    Liver Function Tests:  Recent Labs Lab 02/14/13 2030 02/15/13 0520 02/16/13 0550 02/18/13 0540  AST 95* 60* 32 37  ALT 56* 39 25 24  ALKPHOS 162* 132* 136* 132*  BILITOT 0.9 1.1 0.9 0.8  PROT 9.0* 7.2 6.9 7.1  ALBUMIN 4.7 3.6 3.1* 3.1*    Recent Labs Lab 02/14/13 2030 02/15/13 0520 02/17/13 0520 02/18/13 0540  LIPASE 1239* 1859* 191* 188*   No results found for this basename: AMMONIA,  in the last 168 hours CBC:  Recent Labs Lab 02/14/13 2030 02/15/13 0520 02/16/13 0550 02/18/13 0540  WBC  11.7* 16.1* 16.4* 9.2  NEUTROABS 10.1*  --   --   --   HGB 16.7 14.7 14.9 14.2  HCT 47.2 43.2 43.2 41.7  MCV 86.8 87.3 87.6 86.9  PLT 298 293 267 304   Cardiac Enzymes: No results found for this basename: CKTOTAL, CKMB, CKMBINDEX, TROPONINI,  in the last 168 hours BNP (last 3 results) No results found for this basename: PROBNP,  in the last 8760 hours CBG: No results found for this basename: GLUCAP,  in the last 168 hours  No results found for this or any previous visit (from the past 240 hour(s)).   Studies: No results found.  Scheduled Meds: . enoxaparin (LOVENOX) injection  40 mg Subcutaneous Daily  . folic acid  1 mg Oral Daily  . multivitamin with minerals  1 tablet Oral Daily  . sodium chloride  3 mL  Intravenous Q12H  . thiamine  100 mg Oral Daily   Or  . thiamine  100 mg Intravenous Daily   Continuous Infusions: . dextrose 5 % and 0.45 % NaCl with KCl 20 mEq/L 100 mL/hr at 02/18/13 0524    Principal Problem:   Acute pancreatitis Active Problems:   Alcohol abuse   Nicotine abuse    Time spent: > 35 minutes    [facility] A  [facility] Pager [phone] 7PM-7AM, please contact night-coverage at [email or web address], password [facility] 02/18/2013, 9:47 AM  LOS: 4 days

## 2013-02-19 LAB — BASIC METABOLIC PANEL
CO2: 25 mEq/L (ref 19–32)
Chloride: 102 mEq/L (ref 96–112)
Glucose, Bld: 94 mg/dL (ref 70–99)
Potassium: 4 mEq/L (ref 3.5–5.1)
Sodium: 137 mEq/L (ref 135–145)

## 2013-02-19 MED ORDER — HYDROCODONE-ACETAMINOPHEN 5-325 MG PO TABS: 1.0000 | ORAL_TABLET | Freq: Four times a day (QID) | ORAL | Status: DC | PRN

## 2013-02-19 MED ORDER — ADULT MULTIVITAMIN W/MINERALS CH: 1.0000 | ORAL_TABLET | Freq: Every day | ORAL | Status: DC

## 2013-02-19 NOTE — Discharge Summary (Signed)
Physician Discharge Summary  Antonio Alvarez:[id number] DOB: Sep 21, 1980 DOA: 02/14/2013  PCP: No PCP Per Patient  Admit date: 02/14/2013 Discharge date: 02/19/2013  Time spent: 40 minutes  Recommendations for Outpatient Follow-up:  1. Followup with primary care physician within one week  Discharge Diagnoses:  Principal Problem:   Acute pancreatitis Active Problems:   Alcohol abuse   Nicotine abuse   Discharge Condition: Stable  Diet recommendation: Low fiber diet  Filed Weights   02/16/13 2041 02/17/13 2100 02/18/13 2014  Weight: 69.4 kg (153 lb) 67.5 kg (148 lb 13 oz) 67.405 kg (148 lb 9.6 oz)    History of present illness:  Patient is a 32 year old African American male with history of heavy alcohol abuse, nicotine abuse presented to the ED with complaint of abdominal pain. Patient states that he works third shift and woke up around 12 noon with severe abdominal pain. Patient described the pain as severe 10/10 in intensity, intermittent, cramping, epigastric and radiating to the left side towards the back. The pain was associated with intractable nausea and vomiting several times yesterday. Patient endorses having chills but no fevers, shortness of breath, diarrhea.  He is a heavy drinker and drinks a gallon on the fifth of vodka every day, last drink at 4 AM yesterday. Lipase in the ED was 1239 with transaminitis typical of alcoholic pattern. This is his first episode of pancreatitis  Hospital Course:   1. Acute pancreatitis: Likely alcoholic pancreatitis, this is his first episode of pancreatitis. Patient did have lipase of 1239 upon admission, initially patient was placed on clear liquid diet but his lipase went up to 1859. Patient was placed n.p.o., within one day his lipase went down to 191, patient was kept n.p.o. because he was complaining about pain. On 02/19/2019 1401 check on him as patient n.p.o. he told me that he ate part of his significant other [employer] sandwich. He  reported no pain or nausea after he ate, so I put him on full liquids, last night also he ate solid diet without medical advice, he did not have any pain or nausea after that. This morning advance him to a regular diet, he tolerated that very well. Although his lipase this morning is 189, he did not have any pain or nausea. I felt it is of no benefit to him to keep him in the hospital to pretend that he is n.p.o. on clear liquids while he is eating [employer] everyday. And as I mentioned above patient did not have any pain or nausea with that. She was felt safe to be discharged. He received prescription for Vicodin for 30 tablets.  2. Alcohol abuse: Patient reported significant alcohol abuse, he says he drinks all day, and he couldn't quite quantify his drinking. He said anywhere between a fifth and a gallon of vodka per day. Upon admission to the hospital patient started on multivitamin, folate and thiamine. Patient did very well denies any anxiety, he did not have any shakes or symptoms to suggest alcohol withdrawal.  3. Transaminitis: On admission patient was evaluated by CT scan and ultrasound did not show any evidence of biliary tree pathology. The transaminitis is likely secondary to his alcohol drinking habit.  4. Leukocytosis: Secondary to acute pancreatitis, patient also did have low-grade fever upon admission. This is all likely low grade SIRS from the acute pancreatitis. This is all resolved. Patient did have clear urine and clear x-ray.  Procedures:  None Consultations:  None  Discharge Exam: Filed Vitals:  02/18/13 1748 02/18/13 2014 02/19/13 0638 02/19/13 0954  BP: 133/85 139/81 122/74 98/54  Pulse: 64 67 53 60  Temp: 98.2 F (36.8 C) 98.3 F (36.8 C) 97.9 F (36.6 C) 97.8 F (36.6 C)  TempSrc: Oral Oral Oral Oral  Resp: 17 18 18 20   Height:  5\' 11"  (1.803 m)    Weight:  67.405 kg (148 lb 9.6 oz)    SpO2: 100% 100% 95% 99%   General: Alert and awake, oriented x3,  not in any acute distress. HEENT: anicteric sclera, pupils reactive to light and accommodation, EOMI CVS: S1-S2 clear, no murmur rubs or gallops Chest: clear to auscultation bilaterally, no wheezing, rales or rhonchi Abdomen: soft nontender, nondistended, normal bowel sounds, no organomegaly Extremities: no cyanosis, clubbing or edema noted bilaterally Neuro: Cranial nerves II-XII intact, no focal neurological deficits  Discharge Instructions  Discharge Orders   Future Orders Complete By Expires     Diet general  As directed     Comments:      Low Fat diet    Increase activity slowly  As directed         Medication List    TAKE these medications       ADVIL PM PO  Take 1 tablet by mouth once. For sleep     HYDROcodone-acetaminophen 5-325 MG per tablet  Commonly known as:  NORCO  Take 1 tablet by mouth every 6 (six) hours as needed for pain.     multivitamin with minerals Tabs  Take 1 tablet by mouth daily.       No Known Allergies    The results of significant diagnostics from this hospitalization (including imaging, microbiology, ancillary and laboratory) are listed below for reference.    Significant Diagnostic Studies: US Abdomen Complete  02/15/2013   *RADIOLOGY REPORT*  Clinical Data:  Acute pancreatitis  ABDOMINAL ULTRASOUND COMPLETE  Comparison:  CT earlier today  Findings:  Gallbladder:  No gallstones, gallbladder wall thickening, or pericholecystic fluid.  Common Bile Duct:  Within normal limits in caliber.  Liver: The liver is diffusely increased in echogenicity compatible with diffuse hepatic steatosis as noted on the accompanying CT.  A small amount of fluid between the liver and right kidney is noted. No focal liver mass.  IVC:  Appears normal.  Pancreas:  The head of the pancreas is prominent.  There is a suspicious hypoechoic area within the head of the pancreas.  Mass is not excluded. Please refer to image #4.  Spleen:  Within normal limits in size and  echotexture.  Right kidney:  Normal in size and parenchymal echogenicity.  No evidence of mass or hydronephrosis.  Left kidney:  11.7 meters in length.  No hydronephrosis.  Normal cortical echogenicity.  There is a 3.2 x 2.9 cm heterogeneous and hyperechoic mass corresponding to the abnormality on CT.  There is some through transmission.  This suggests a cystic or complex cystic nature.  Pre and postcontrast imaging on the CT was not performed.  Abdominal Aorta:  Maximal aortic diameter is 2.5 cm.  IMPRESSION: The pancreatic head is prominent compatible with the inflammatory process noted on CT.  There is a hypoechoic area and soft tissue mass in the pancreatic head is not excluded.  Short-term follow-up imaging is recommended to ensure resolution of this finding.  If abnormality persist, MRI can be performed to further characterize.  3.0 cm mass in the lower pole of the left kidney.  MRI is recommended with and without contrast.  Small amount of ascites.  Diffuse hepatic steatosis.   Original Report Authenticated By: Jolaine Click, M.D.   Ct Abdomen Pelvis W Contrast  02/15/2013   *RADIOLOGY REPORT*  Clinical Data: Left upper quadrant abdominal pain, nausea and vomiting  CT ABDOMEN AND PELVIS WITH CONTRAST  Technique:  Multidetector CT imaging of the abdomen and pelvis was performed following the standard protocol during bolus administration of intravenous contrast.  Contrast: OMNIPAQUE IOHEXOL 300 MG/ML  SOLN  Comparison: No similar prior study is available for comparison.  Findings: Lung bases are clear.  Mild reflux of contrast in the distal esophagus noted.  Hepatic hypodensity suggests steatosis. Sparing about the gallbladder fossa incidentally noted.  Adrenal glands, right kidney, and spleen are normal.  Left lower renal pole 2.8 cm cyst incidentally noted.  There is extensive peripancreatic fluid without rim enhancing fluid collection to suggest abscess formation.  No pancreatic ductal dilatation.  The  pancreas enhances relatively homogeneously with mild inhomogeneity at the uncinate process but no definite fragment formation.  Bladder wall thickness at upper limits of normal concentrically. No pelvic lymphadenopathy.  Low density free pelvic fluid extends inferiorly from the pericolic gutters.  No bowel wall thickening or focal segmental dilatation.  No acute osseous finding.  IMPRESSION: Extensive peripancreatic fluid and minimally inhomogeneous enhancement of the uncinate process which may suggest pancreatitis in the appropriate clinical context.  No pancreatic ductal dilatation or evidence for rim enhancing fluid collection to suggest peripancreatic abscess.  Fatty liver.   Original Report Authenticated By: Christiana Pellant, M.D.    Microbiology: No results found for this or any previous visit (from the past 240 hour(s)).   Labs: Basic Metabolic Panel:  Recent Labs Lab 02/14/13 2030 02/15/13 0520 02/16/13 0550 02/18/13 0540 02/19/13 0420  NA 136 135 134* 135 137  K 4.2 4.3 4.2 3.6 4.0  CL 94* 99 100 99 102  CO2 29 26 25 27 25   GLUCOSE 123* 85 78 102* 94  BUN 9 8 4* 4* 4*  CREATININE 0.92 0.84 0.86 0.77 0.91  CALCIUM 9.6 8.1* 8.4 9.0 8.9  MG 2.0  --   --   --   --    Liver Function Tests:  Recent Labs Lab 02/14/13 2030 02/15/13 0520 02/16/13 0550 02/18/13 0540  AST 95* 60* 32 37  ALT 56* 39 25 24  ALKPHOS 162* 132* 136* 132*  BILITOT 0.9 1.1 0.9 0.8  PROT 9.0* 7.2 6.9 7.1  ALBUMIN 4.7 3.6 3.1* 3.1*    Recent Labs Lab 02/14/13 2030 02/15/13 0520 02/17/13 0520 02/18/13 0540 02/19/13 0420  LIPASE 1239* 1859* 191* 188* 189*   No results found for this basename: AMMONIA,  in the last 168 hours CBC:  Recent Labs Lab 02/14/13 2030 02/15/13 0520 02/16/13 0550 02/18/13 0540  WBC 11.7* 16.1* 16.4* 9.2  NEUTROABS 10.1*  --   --   --   HGB 16.7 14.7 14.9 14.2  HCT 47.2 43.2 43.2 41.7  MCV 86.8 87.3 87.6 86.9  PLT 298 293 267 304   Cardiac Enzymes: No results  found for this basename: CKTOTAL, CKMB, CKMBINDEX, TROPONINI,  in the last 168 hours BNP: BNP (last 3 results) No results found for this basename: PROBNP,  in the last 8760 hours CBG: No results found for this basename: GLUCAP,  in the last 168 hours     Signed:  Tametra Ahart A  [facility] 02/19/2013, 12:09 PM

## 2013-02-19 NOTE — Progress Notes (Signed)
Pt reports eating a small portion of significant other's [employer] sandwich during the night. Pt reports "it still hurts but not as bad". No reports of nausea. Dondra Spry

## 2013-02-19 NOTE — Progress Notes (Signed)
NCM spoke to pt and provided info on [employer] in [place] that will assist self-pay patients. Contact info added to dc instructions. Pt states he will call [facility] on Monday to arrange appt for follow up post dc. Isidoro Donning RN CCM Case Mgmt phone [phone]

## 2013-05-06 ENCOUNTER — Encounter (HOSPITAL_COMMUNITY): Payer: Self-pay | Admitting: Emergency Medicine

## 2013-05-06 ENCOUNTER — Inpatient Hospital Stay (HOSPITAL_COMMUNITY)
Admission: EM | Admit: 2013-05-06 | Discharge: 2013-05-09 | DRG: 439 | Disposition: A | Discharge status: Home / Self Care | Payer: Self-pay | Attending: Family Medicine | Admitting: Family Medicine

## 2013-05-06 DIAGNOSIS — F17200 Nicotine dependence, unspecified, uncomplicated: Secondary | ICD-10-CM

## 2013-05-06 DIAGNOSIS — F1010 Alcohol abuse, uncomplicated: Secondary | ICD-10-CM

## 2013-05-06 DIAGNOSIS — E871 Hypo-osmolality and hyponatremia: Secondary | ICD-10-CM | POA: Diagnosis present

## 2013-05-06 DIAGNOSIS — R6510 Systemic inflammatory response syndrome (SIRS) of non-infectious origin without acute organ dysfunction: Secondary | ICD-10-CM | POA: Diagnosis present

## 2013-05-06 DIAGNOSIS — E860 Dehydration: Secondary | ICD-10-CM

## 2013-05-06 DIAGNOSIS — Z720 Tobacco use: Secondary | ICD-10-CM | POA: Diagnosis present

## 2013-05-06 DIAGNOSIS — F10239 Alcohol dependence with withdrawal, unspecified: Secondary | ICD-10-CM | POA: Diagnosis present

## 2013-05-06 DIAGNOSIS — K859 Acute pancreatitis without necrosis or infection, unspecified: Principal | ICD-10-CM

## 2013-05-06 DIAGNOSIS — F1210 Cannabis abuse, uncomplicated: Secondary | ICD-10-CM | POA: Diagnosis present

## 2013-05-06 HISTORY — DX: Acute pancreatitis without necrosis or infection, unspecified: K85.90

## 2013-05-06 LAB — COMPREHENSIVE METABOLIC PANEL
ALT: 26 U/L (ref 0–53)
Alkaline Phosphatase: 116 U/L (ref 39–117)
CO2: 27 mEq/L (ref 19–32)
Calcium: 9.7 mg/dL (ref 8.4–10.5)
GFR calc Af Amer: >90 mL/min (ref >90)
GFR calc non Af Amer: >90 mL/min (ref >90)
Glucose, Bld: 119 mg/dL — ABNORMAL HIGH (ref 70–99)
Sodium: 137 mEq/L (ref 135–145)

## 2013-05-06 LAB — CBC WITH DIFFERENTIAL/PLATELET
Eosinophils Absolute: 0.1 10*3/uL (ref 0.0–0.7)
Eosinophils Relative: 1 % (ref 0–5)
HCT: 46.2 % (ref 39.0–52.0)
Hemoglobin: 16.4 g/dL (ref 13.0–17.0)
Lymphs Abs: 1.3 10*3/uL (ref 0.7–4.0)
MCH: 31.1 pg (ref 26.0–34.0)
MCV: 87.7 fL (ref 78.0–100.0)
Monocytes Relative: 5 % (ref 3–12)
Neutrophils Relative %: 82 % — ABNORMAL HIGH (ref 43–77)
RBC: 5.27 MIL/uL (ref 4.22–5.81)

## 2013-05-06 LAB — POCT I-STAT, CHEM 8
Calcium, Ion: 1.16 mmol/L (ref 1.12–1.23)
Glucose, Bld: 120 mg/dL — ABNORMAL HIGH (ref 70–99)
HCT: 52 % (ref 39.0–52.0)
Hemoglobin: 17.7 g/dL — ABNORMAL HIGH (ref 13.0–17.0)
TCO2: 27 mmol/L (ref 0–100)

## 2013-05-06 MED ORDER — THIAMINE HCL 100 MG/ML IJ SOLN
100.0000 mg | Freq: Every day | INTRAMUSCULAR | Status: DC
Start: 2013-05-07 — End: 2013-05-09
  Filled 2013-05-06 (×3): qty 1

## 2013-05-06 MED ORDER — ACETAMINOPHEN 650 MG RE SUPP: 650.0000 mg | Freq: Four times a day (QID) | RECTAL | Status: DC | PRN

## 2013-05-06 MED ORDER — HYDROMORPHONE HCL PF 1 MG/ML IJ SOLN
0.5000 mg | INTRAMUSCULAR | Status: DC | PRN
  Administered 2013-05-07 (×3): 1 mg via INTRAVENOUS
  Administered 2013-05-07: 0.5 mg via INTRAVENOUS
  Administered 2013-05-07 – 2013-05-08 (×5): 1 mg via INTRAVENOUS
  Filled 2013-05-06 (×9): qty 1

## 2013-05-06 MED ORDER — ONDANSETRON HCL 4 MG PO TABS: 4.0000 mg | ORAL_TABLET | Freq: Four times a day (QID) | ORAL | Status: DC | PRN

## 2013-05-06 MED ORDER — HYDROMORPHONE HCL PF 1 MG/ML IJ SOLN
1.0000 mg | Freq: Once | INTRAMUSCULAR | Status: AC
  Administered 2013-05-06: 1 mg via INTRAVENOUS
  Filled 2013-05-06: qty 1

## 2013-05-06 MED ORDER — SODIUM CHLORIDE 0.9 % IV BOLUS (SEPSIS)
2000.0000 mL | Freq: Once | INTRAVENOUS | Status: AC
  Administered 2013-05-06: 2000 mL via INTRAVENOUS

## 2013-05-06 MED ORDER — LORAZEPAM 2 MG/ML IJ SOLN
0.0000 mg | Freq: Four times a day (QID) | INTRAMUSCULAR | Status: DC
  Administered 2013-05-08: 2 mg via INTRAVENOUS
  Administered 2013-05-08: 4 mg via INTRAVENOUS
  Filled 2013-05-06: qty 1
  Filled 2013-05-06: qty 2

## 2013-05-06 MED ORDER — ALUM & MAG HYDROXIDE-SIMETH 200-200-20 MG/5ML PO SUSP: 30.0000 mL | Freq: Four times a day (QID) | ORAL | Status: DC | PRN

## 2013-05-06 MED ORDER — ONDANSETRON HCL 4 MG/2ML IJ SOLN
4.0000 mg | Freq: Once | INTRAMUSCULAR | Status: AC
  Administered 2013-05-06: 4 mg via INTRAVENOUS
  Filled 2013-05-06: qty 2

## 2013-05-06 MED ORDER — FOLIC ACID 1 MG PO TABS
1.0000 mg | ORAL_TABLET | Freq: Every day | ORAL | Status: DC
  Administered 2013-05-07 – 2013-05-09 (×3): 1 mg via ORAL
  Filled 2013-05-06 (×3): qty 1

## 2013-05-06 MED ORDER — ADULT MULTIVITAMIN W/MINERALS CH
1.0000 | ORAL_TABLET | Freq: Every day | ORAL | Status: DC
  Administered 2013-05-07 – 2013-05-09 (×3): 1 via ORAL
  Filled 2013-05-06 (×3): qty 1

## 2013-05-06 MED ORDER — LORAZEPAM 2 MG/ML IJ SOLN: 0.0000 mg | Freq: Two times a day (BID) | INTRAMUSCULAR | Status: DC

## 2013-05-06 MED ORDER — SODIUM CHLORIDE 0.9 % IV SOLN
INTRAVENOUS | Status: DC
  Administered 2013-05-07 – 2013-05-08 (×4): via INTRAVENOUS
  Administered 2013-05-08: 505 mL via INTRAVENOUS

## 2013-05-06 MED ORDER — ACETAMINOPHEN 325 MG PO TABS: 650.0000 mg | ORAL_TABLET | Freq: Four times a day (QID) | ORAL | Status: DC | PRN

## 2013-05-06 MED ORDER — LORAZEPAM 2 MG/ML IJ SOLN: 1.0000 mg | Freq: Four times a day (QID) | INTRAMUSCULAR | Status: DC | PRN

## 2013-05-06 MED ORDER — SODIUM CHLORIDE 0.9 % IV SOLN: INTRAVENOUS | Status: AC

## 2013-05-06 MED ORDER — LORAZEPAM 1 MG PO TABS: 1.0000 mg | ORAL_TABLET | Freq: Four times a day (QID) | ORAL | Status: DC | PRN

## 2013-05-06 MED ORDER — OXYCODONE HCL 5 MG PO TABS
5.0000 mg | ORAL_TABLET | ORAL | Status: DC | PRN
  Administered 2013-05-07 – 2013-05-09 (×4): 5 mg via ORAL
  Filled 2013-05-06 (×4): qty 1

## 2013-05-06 MED ORDER — PANTOPRAZOLE SODIUM 40 MG IV SOLR
40.0000 mg | Freq: Two times a day (BID) | INTRAVENOUS | Status: DC
  Administered 2013-05-07 – 2013-05-08 (×5): 40 mg via INTRAVENOUS
  Filled 2013-05-06 (×7): qty 40

## 2013-05-06 MED ORDER — VITAMIN B-1 100 MG PO TABS
100.0000 mg | ORAL_TABLET | Freq: Every day | ORAL | Status: DC
  Administered 2013-05-07 – 2013-05-09 (×3): 100 mg via ORAL
  Filled 2013-05-06 (×3): qty 1

## 2013-05-06 MED ORDER — ONDANSETRON HCL 4 MG/2ML IJ SOLN: 4.0000 mg | Freq: Four times a day (QID) | INTRAMUSCULAR | Status: DC | PRN

## 2013-05-06 MED ORDER — ZOLPIDEM TARTRATE 5 MG PO TABS: 5.0000 mg | ORAL_TABLET | Freq: Every evening | ORAL | Status: DC | PRN

## 2013-05-06 NOTE — ED Provider Notes (Signed)
CSN: [id number]     Arrival date & time 05/06/13  2022 History     First MD Initiated Contact with Patient 05/06/13 2157     Chief Complaint  Patient presents with  . Abdominal Pain   (Consider location/radiation/quality/duration/timing/severity/associated sxs/prior Treatment) Patient is a 32 y.o. male presenting with abdominal pain. The history is provided by the patient.  Abdominal Pain Pain location:  Epigastric Pain quality: aching and gnawing   Pain radiates to:  Does not radiate Pain severity:  Moderate Onset quality:  Gradual Duration:  15 hours Timing:  Constant Progression:  Worsening Chronicity:  Recurrent Context: alcohol use   Relieved by:  Nothing Worsened by:  Eating Ineffective treatments:  None tried Associated symptoms: chills, nausea and vomiting   Associated symptoms: no chest pain, no cough, no dysuria, no fatigue, no fever, no hematuria, no shortness of breath and no sore throat     Past Medical History  Diagnosis Date  . Abscess     hx of abscess on neck  . Alcohol abuse   . Cellulitis   . Hyponatremia   . Leukocytosis   . Pancreatitis    Past Surgical History  Procedure Laterality Date  . Abscess removed from neck     Family History  Problem Relation Age of Onset  . Alcohol abuse Father    History  Substance Use Topics  . Smoking status: Current Every Day Smoker -- 0.50 packs/day for 15 years    Types: Cigarettes  . Smokeless tobacco: Never Used  . Alcohol Use: 21.0 oz/week    35 Cans of beer per week    Review of Systems  Constitutional: Positive for chills and appetite change. Negative for fever, activity change and fatigue.  HENT: Negative for congestion, sore throat, facial swelling, rhinorrhea, trouble swallowing, neck pain, neck stiffness, voice change and sinus pressure.   Eyes: Negative.   Respiratory: Negative for cough, choking, chest tightness, shortness of breath and wheezing.   Cardiovascular: Negative for chest pain.   Gastrointestinal: Positive for nausea, vomiting and abdominal pain.  Genitourinary: Negative for dysuria, urgency, frequency, hematuria, flank pain and difficulty urinating.  Musculoskeletal: Negative for back pain and gait problem.  Skin: Negative for rash and wound.  Neurological: Negative for facial asymmetry, weakness, numbness and headaches.  Psychiatric/Behavioral: Negative for behavioral problems, confusion and agitation. The patient is not nervous/anxious and is not hyperactive.   All other systems reviewed and are negative.    Allergies  Review of patient's allergies indicates no known allergies.  Home Medications  No current outpatient prescriptions on file. BP 135/82  Pulse 74  Temp(Src) 98 F (36.7 C) (Oral)  Resp 16  SpO2 98% Physical Exam  Nursing note and vitals reviewed. Constitutional: He is oriented to person, place, and time. He appears well-developed and well-nourished. No distress.  HENT:  Head: Normocephalic and atraumatic.  Right Ear: External ear normal.  Left Ear: External ear normal.  Mouth/Throat: No oropharyngeal exudate.  Eyes: Conjunctivae and EOM are normal. Pupils are equal, round, and reactive to light. Right eye exhibits no discharge. Left eye exhibits no discharge.  Neck: Normal range of motion. Neck supple. No JVD present. No tracheal deviation present. No thyromegaly present.  Cardiovascular: Normal rate, regular rhythm, normal heart sounds and intact distal pulses.  Exam reveals no gallop and no friction rub.   No murmur heard. Pulmonary/Chest: Effort normal and breath sounds normal. No respiratory distress. He has no wheezes. He exhibits no tenderness.  Abdominal: Soft.  Bowel sounds are normal. He exhibits no distension. There is tenderness in the epigastric area. There is no rigidity, no rebound, no guarding, no tenderness at McBurney's point and negative Murphy's sign.  Musculoskeletal: Normal range of motion. He exhibits no edema and no  tenderness.  Lymphadenopathy:    He has no cervical adenopathy.  Neurological: He is alert and oriented to person, place, and time. No cranial nerve deficit.  Skin: Skin is warm and dry. No rash noted. He is not diaphoretic. No pallor.  Psychiatric: He has a normal mood and affect. His behavior is normal.    ED Course   Procedures (including critical care time)  Labs Reviewed  CBC WITH DIFFERENTIAL - Abnormal; Notable for the following:    WBC 11.2 (*)    Neutrophils Relative % 82 (*)    Neutro Abs 9.1 (*)    All other components within normal limits  LIPASE, BLOOD - Abnormal; Notable for the following:    Lipase 2419 (*)    All other components within normal limits  COMPREHENSIVE METABOLIC PANEL - Abnormal; Notable for the following:    Glucose, Bld 119 (*)    Total Protein 8.8 (*)    All other components within normal limits  POCT I-STAT, CHEM 8 - Abnormal; Notable for the following:    Glucose, Bld 120 (*)    Hemoglobin 17.7 (*)    All other components within normal limits  URINALYSIS, ROUTINE W REFLEX MICROSCOPIC  BASIC METABOLIC PANEL  CBC   No results found. No diagnosis found.  MDM  32 yr old M pt here with epigastric abdominal pain. Patient also with nausea and vomiting. Patient is a chronic alcoholic drinking numerous drinks per day. Patient says his last drink was last night and then started getting abdominal pain this morning. Vomit is NBNB. Patient with elevated lipase but reassuring CMP and CBC. Gave patient 2 L of fluid, pain medicine and zofran and patient still does not feel he can tolerate PO's. Will admit patient to the hospital as he is not able to tolerate PO's. Patient can then be observed to see if he improves.  Case discussed with Dr. Benjamin Stain, MD 05/06/13 [phone]

## 2013-05-06 NOTE — ED Notes (Addendum)
Patient with abdominal pain since this am, with nausea and vomiting.  Patient states he started vomiting before he got here in the ambulance.  Patient does have history of pancreatitis, last intake of alcohol was last night.

## 2013-05-07 ENCOUNTER — Inpatient Hospital Stay (HOSPITAL_COMMUNITY): Payer: Self-pay

## 2013-05-07 ENCOUNTER — Encounter (HOSPITAL_COMMUNITY): Payer: Self-pay | Admitting: *Deleted

## 2013-05-07 LAB — BASIC METABOLIC PANEL
BUN: 9 mg/dL (ref 6–23)
Calcium: 8.7 mg/dL (ref 8.4–10.5)
Creatinine, Ser: 0.82 mg/dL (ref 0.50–1.35)
GFR calc Af Amer: >90 mL/min (ref >90)
GFR calc non Af Amer: >90 mL/min (ref >90)
Potassium: 4 mEq/L (ref 3.5–5.1)

## 2013-05-07 LAB — CBC
MCHC: 34.4 g/dL (ref 30.0–36.0)
Platelets: 295 10*3/uL (ref 150–400)
RDW: 13.8 % (ref 11.5–15.5)

## 2013-05-07 LAB — URINALYSIS, ROUTINE W REFLEX MICROSCOPIC
Bilirubin Urine: NEGATIVE
Ketones, ur: 40 mg/dL — AB
Nitrite: NEGATIVE
Protein, ur: 30 mg/dL — AB
Specific Gravity, Urine: 1.019 (ref 1.005–1.030)
Urobilinogen, UA: 0.2 mg/dL (ref 0.0–1.0)

## 2013-05-07 LAB — LIPASE, BLOOD: Lipase: 1921 U/L — ABNORMAL HIGH (ref 11–59)

## 2013-05-07 MED ORDER — PNEUMOCOCCAL VAC POLYVALENT 25 MCG/0.5ML IJ INJ
0.5000 mL | INJECTION | INTRAMUSCULAR | Status: AC
  Filled 2013-05-07: qty 0.5

## 2013-05-07 NOTE — ED Notes (Signed)
The pt has gone from the room. ???? In [place].  No one saw him leave

## 2013-05-07 NOTE — ED Notes (Signed)
Report called to the floor.

## 2013-05-07 NOTE — Progress Notes (Addendum)
NURSING PROGRESS NOTE  Antonio Alvarez [id number] Admission Data: 05/07/2013 4:28 AM Attending Provider: Ron Parker, MD PCP:No PCP Per Patient Code Status ;full code  Antonio Alvarez is a 32 y.o. male patient admitted from ED:  -No acute distress noted.  -No complaints of shortness of breath.  -No complaints of chest pain.   Cardiac Monitoring: Box # N/A in place. Cardiac monitor yields:N/A.  Blood pressure 136/79, pulse 63, temperature 98.5 F (36.9 C), temperature source Oral, resp. rate 18, height 5\' 11"  (1.803 m), weight 66.4 kg (146 lb 6.2 oz), SpO2 99.00%.   IV Fluids:  IV in place, occlusive dsg intact without redness, IV cath  normal saline.   Allergies:  Review of patient's allergies indicates no known allergies.  Past Medical History:   has a past medical history of Abscess; Alcohol abuse; Cellulitis; Hyponatremia; Leukocytosis; and Pancreatitis.  Past Surgical History:   has past surgical history that includes abscess removed from neck.  Social History:   reports that he has been smoking Cigarettes.  He has a 1 pack-year smoking history. He uses smokeless tobacco. He reports that he drinks about 21.0 ounces of alcohol per week. He reports that he uses illicit drugs (Marijuana).  Skin:intact Patient/Family orientated to room. Information packet given to patient/family. Admission inpatient armband information verified with patient/family to include name and date of birth and placed on patient arm. Side rails up x 2, fall assessment and education completed with patient/family. Patient/family able to verbalize understanding of risk associated with falls and verbalized understanding to call for assistance before getting out of bed. Call light within reach. Patient/family able to voice and demonstrate understanding of unit orientation instructions.    Will continue to evaluate and treat per MD orders.

## 2013-05-07 NOTE — ED Provider Notes (Signed)
I saw and evaluated the patient, reviewed the resident's note and I agree with the findings and plan. Patient with abdominal pain and pancreatitis. Has continued vomiting. Likely do to alcohol as the cause of pancreatitis. he will be admitted to internal medicine  Juliet Rude. Rubin Payor, MD 05/07/13 2330

## 2013-05-07 NOTE — Progress Notes (Signed)
Pt was brought up from ed via stretcher. Pt was put in bed, oriented to room,  And call bell is within reach. Pt is in no apparent distress at this time. VSS. RN will continue to monitor pt

## 2013-05-07 NOTE — Progress Notes (Signed)
Antonio Alvarez WGN:[id number] DOB: 1981/03/11 DOA: 05/06/2013 PCP: No PCP Per Patient  Brief narrative:  32 year old male, admitted 05/07/2013 with known alcohol abuse for 17 years admitted with abdominal pain, found to have lipase 2419 and acute pancreatitis  Past medical history-As per Problem list Chart reviewed as below- Admission acute pancreatitis 02/14/2013 Admission 08/13/2011 for cellulitis right foot  Consultants:  None  Procedures:  Ultrasound abdomen 8/23  Antibiotics:  ` None   Subjective  Doing fair.  denies n/v./cp Still NPO.  State [place] recurs significantly when IV pain meds wear off   Objective    Interim History: nad  Telemetry:  non tele  Objective: Filed Vitals:   05/06/13 2028 05/07/13 0128 05/07/13 0140 05/07/13 0547  BP: 135/82  136/79 133/82  Pulse: 74  63 61  Temp: 98 F (36.7 C)  98.5 F (36.9 C) 98.2 F (36.8 C)  TempSrc: Oral Oral Oral Oral  Resp: 16  18 20   Height:   5\' 11"  (1.803 m)   Weight:   66.4 kg (146 lb 6.2 oz)   SpO2: 98%  99% 95%    Intake/Output Summary (Last 24 hours) at 05/07/13 0920 Last data filed at 05/07/13 0502  Gross per 24 hour  Intake      0 ml  Output    200 ml  Net   -200 ml    Exam:  General: alert, pleasant looks older than stated age Cardiovascular:  s1 s2 no m/r/g Respiratory: clear, no added soun Abdomen: soft, NT/ND Skin keloid to L axilla.   Neuro intact  Data Reviewed: Basic Metabolic Panel:  Recent Labs Lab 05/06/13 2027 05/06/13 2044 05/07/13 0628  NA 137 139 138  K 4.3 4.1 4.0  CL 98 101 103  CO2 27  --  24  GLUCOSE 119* 120* 97  BUN 12 12 9   CREATININE 0.94 1.00 0.82  CALCIUM 9.7  --  8.7   Liver Function Tests:  Recent Labs Lab 05/06/13 2027  AST 37  ALT 26  ALKPHOS 116  BILITOT 0.7  PROT 8.8*  ALBUMIN 4.5    Recent Labs Lab 05/06/13 2027 05/07/13 0628  LIPASE 2419* 1921*   No results found for this basename: AMMONIA,  in the last 168  hours CBC:  Recent Labs Lab 05/06/13 2027 05/06/13 2044 05/07/13 0628  WBC 11.2*  --  12.6*  NEUTROABS 9.1*  --   --   HGB 16.4 17.7* 14.4  HCT 46.2 52.0 41.9  MCV 87.7  --  88.2  PLT 304  --  295   Cardiac Enzymes: No results found for this basename: CKTOTAL, CKMB, CKMBINDEX, TROPONINI,  in the last 168 hours BNP: No components found with this basename: POCBNP,  CBG: No results found for this basename: GLUCAP,  in the last 168 hours  No results found for this or any previous visit (from the past 240 hour(s)).   Studies:              All Imaging reviewed and is as per above notation   Scheduled Meds: . sodium chloride   Intravenous STAT  . folic acid  1 mg Oral Daily  . LORazepam  0-4 mg Intravenous Q6H   Followed by  . [START ON 05/08/2013] LORazepam  0-4 mg Intravenous Q12H  . multivitamin with minerals  1 tablet Oral Daily  . pantoprazole (PROTONIX) IV  40 mg Intravenous Q12H  . [START ON 05/08/2013] pneumococcal 23 valent vaccine  0.5 mL  Intramuscular Tomorrow-1000  . thiamine  100 mg Oral Daily   Or  . thiamine  100 mg Intravenous Daily   Continuous Infusions: . sodium chloride 100 mL/hr at 05/07/13 0145     Assessment/Plan: 1. Likely ETOH induced pancreatitis-US denotes no GB stones.  NPO, IVF 100 cc/hr, Will start clear liquids in 1-2 days.  Pain control with IV dilaudid .5-1 mg q3prn.  Lipase am 2. Heavy ETOH use-CIWA was 2.  Has PRN ativan-continue Thiamine/Folic acid 3. Leukocytosis-unlikely infectious-probable SIRs from Pancreatitis-  Expectant management 4. Hemoconcentration 2/2 to #1 5. Tob abuse-counselled to quit-offer patch prn  Code Status: Full Family Communication: None at bedside Disposition Plan: inpatient   Pleas Koch, MD  [facility] Pager [phone] 05/07/2013, 9:20 AM    LOS: 1 day

## 2013-05-07 NOTE — H&P (Signed)
[facility] History and Physical  JACQUEZ SHEETZ RUE:[id number] DOB: [birth year] DOA: 05/06/2013  Referring physician:  EDP PCP: No PCP Per Patient  Specialists:   Chief Complaint:  ABD Pain Nausea and Vomiting  HPI: Antonio Alvarez is a 32 y.o. male with a 17 year history of Alcoholism who presents to the ED with complaints of severe 10/10 epigastric ABD Pain and nausea and vomiting since the AM.  He reports not being able to hold down any food or liquids.  He denies having any hematemesis, or diarrhea or melena or hematochezia.   He also denies having any fevers or chills.  He last drank alcohol last night.   He reports that he drinks 3-4 40 ounce beers daily, and he has been drinking since age 40.   He was evaluated in the ED and was found to have a Lipase level of 2419.  He was referred for medical admission.      Review of Systems: The patient denies anorexia, fever, chills, headaches, weight loss, vision loss, diplopia, dizziness, decreased hearing, rhinitis, hoarseness, chest pain, syncope, dyspnea on exertion, peripheral edema, balance deficits, cough, hemoptysis, diarrhea, constipation, hematemesis, melena, hematochezia, severe indigestion/heartburn, dysuria, hematuria, incontinence, muscle weakness, suspicious skin lesions, transient blindness, difficulty walking, depression, unusual weight change, abnormal bleeding, enlarged lymph nodes, angioedema, and breast masses.    Past Medical History  Diagnosis Date  . Abscess     hx of abscess on neck  . Alcohol abuse   . Cellulitis   . Hyponatremia   . Leukocytosis   . Pancreatitis     Past Surgical History  Procedure Laterality Date  . Abscess removed from neck      Prior to Admission medications   Not on File    No Known Allergies  Social History:  reports that he has been smoking Cigarettes.  He has a 7.5 pack-year smoking history. He has never used smokeless tobacco. He reports that he drinks about 21.0 ounces of  alcohol per week. He reports that he uses illicit drugs (Marijuana).     Family History  Problem Relation Age of Onset  . Alcohol abuse Father      Physical Exam:  GEN:   Disheveled appearing well developed  32 y.o. African American male  examined  and in no acute distress; cooperative with exam Filed Vitals:   05/06/13 2028  BP: 135/82  Pulse: 74  Temp: 98 F (36.7 C)  TempSrc: Oral  Resp: 16  SpO2: 98%   Blood pressure 135/82, pulse 74, temperature 98 F (36.7 C), temperature source Oral, resp. rate 16, SpO2 98.00%. PSYCH: He is alert and oriented x4; does not appear anxious does not appear depressed; affect is normal HEENT: Normocephalic and Atraumatic, Mucous membranes pink; PERRLA; EOM intact; Fundi:  Benign;  No scleral icterus, Nares: Patent, Oropharynx: Clear, Fair Dentition, Neck:  FROM, no cervical lymphadenopathy nor thyromegaly or carotid bruit; no JVD; Breasts:: Not examined CHEST WALL: No tenderness CHEST: Normal respiration, clear to auscultation bilaterally HEART: Regular rate and rhythm; no murmurs rubs or gallops BACK: No kyphosis or scoliosis; no CVA tenderness ABDOMEN: Positive Bowel Sounds, soft non-tender; no masses, no organomegaly.    Rectal Exam: Not done EXTREMITIES: No cyanosis, clubbing or edema; no ulcerations. Genitalia: not examined PULSES: 2+ and symmetric SKIN: Normal hydration no rash or ulceration CNS: Cranial nerves 2-12 grossly intact no focal neurologic deficit    Labs on Admission:  Basic Metabolic Panel:  Recent Labs Lab 05/06/13 2027  05/06/13 2044  NA 137 139  K 4.3 4.1  CL 98 101  CO2 27  --   GLUCOSE 119* 120*  BUN 12 12  CREATININE 0.94 1.00  CALCIUM 9.7  --    Liver Function Tests:  Recent Labs Lab 05/06/13 2027  AST 37  ALT 26  ALKPHOS 116  BILITOT 0.7  PROT 8.8*  ALBUMIN 4.5    Recent Labs Lab 05/06/13 2027  LIPASE 2419*   No results found for this basename: AMMONIA,  in the last 168  hours CBC:  Recent Labs Lab 05/06/13 2027 05/06/13 2044  WBC 11.2*  --   NEUTROABS 9.1*  --   HGB 16.4 17.7*  HCT 46.2 52.0  MCV 87.7  --   PLT 304  --    Cardiac Enzymes: No results found for this basename: CKTOTAL, CKMB, CKMBINDEX, TROPONINI,  in the last 168 hours  BNP (last 3 results) No results found for this basename: PROBNP,  in the last 8760 hours CBG: No results found for this basename: GLUCAP,  in the last 168 hours  Radiological Exams on Admission: No results found.      Assessment/Plan Principal Problem:   Acute pancreatitis Active Problems:   Dehydration   Alcohol abuse   Nicotine abuse   1.   Acute Pancreatitis-   Bowel Rest, IV Protonix, and PRN IV Dilaudid for pain, and PRN IV Zofran for Nausea.     2.   Dehydration-  IVFs, Monitor BUIN/Cr and electrolytes.    3.   Alcohol Abuse- CIWA Protocol.  Counseled, Social work and/or Psych consult needed for Alcohol Counseling.    4.   Nicotine Abuse-  Counseled, Nicotine Patch.     5.  SCDs for DVT prophylaxis.        Code Status:   FULL CODE Family Communication:  No Family Present Disposition Plan:   Return to Home on Discharge      Time spent:  25 Minutes  Ron Parker [facility] Pager [phone]  If 7PM-7AM, please contact night-coverage [email or web address] Password [email or web address] 05/07/2013, 12:01 AM

## 2013-05-08 LAB — LIPASE, BLOOD: Lipase: 637 U/L — ABNORMAL HIGH (ref 11–59)

## 2013-05-08 MED ORDER — HYDROMORPHONE HCL PF 1 MG/ML IJ SOLN
1.0000 mg | INTRAMUSCULAR | Status: DC | PRN
  Administered 2013-05-08 – 2013-05-09 (×3): 1 mg via INTRAVENOUS
  Filled 2013-05-08 (×3): qty 1

## 2013-05-08 NOTE — Progress Notes (Signed)
Antonio Alvarez ZOX:[id number] DOB: 04/06/1981 DOA: 05/06/2013 PCP: No PCP Per Patient  Brief narrative:  32 year old male, admitted 05/07/2013 with known alcohol abuse for 17 years admitted with abdominal pain, found to have lipase 2419 and acute pancreatitis  Past medical history-As per Problem list Chart reviewed as below- Admission acute pancreatitis 02/14/2013 Admission 08/13/2011 for cellulitis right foot  Consultants:  None  Procedures:  Ultrasound abdomen 8/23  Antibiotics:  ` None   Subjective  Doing fair.  denies n/v/cp Not jittery or clinically seems to be withdrawing Wants mor epain medicine-but hasn't trialed oral options   Objective    Interim History: nad  Telemetry:  non tele  Objective: Filed Vitals:   05/07/13 1914 05/07/13 2314 05/08/13 0344 05/08/13 1128  BP: 118/81 114/78 138/75 117/71  Pulse: 67 64 81 63  Temp: 98.4 F (36.9 C) 98.5 F (36.9 C) 98.4 F (36.9 C) 98.2 F (36.8 C)  TempSrc: Oral Oral Oral Oral  Resp: 20 18 18 18   Height:      Weight:      SpO2: 98% 98% 99% 97%    Intake/Output Summary (Last 24 hours) at 05/08/13 1502 Last data filed at 05/08/13 1129  Gross per 24 hour  Intake 2891.67 ml  Output    600 ml  Net 2291.67 ml    Exam:  General: alert, pleasant looks older than stated age Cardiovascular:  s1 s2 no m/r/g Respiratory: clear, no added soun Abdomen: soft, NT/ND Skin keloid to L axilla.   Neuro intact  Data Reviewed: Basic Metabolic Panel:  Recent Labs Lab 05/06/13 2027 05/06/13 2044 05/07/13 0628  NA 137 139 138  K 4.3 4.1 4.0  CL 98 101 103  CO2 27  --  24  GLUCOSE 119* 120* 97  BUN 12 12 9   CREATININE 0.94 1.00 0.82  CALCIUM 9.7  --  8.7   Liver Function Tests:  Recent Labs Lab 05/06/13 2027  AST 37  ALT 26  ALKPHOS 116  BILITOT 0.7  PROT 8.8*  ALBUMIN 4.5    Recent Labs Lab 05/06/13 2027 05/07/13 0628 05/08/13 0530  LIPASE 2419* 1921* 637*   No results found for this  basename: AMMONIA,  in the last 168 hours CBC:  Recent Labs Lab 05/06/13 2027 05/06/13 2044 05/07/13 0628  WBC 11.2*  --  12.6*  NEUTROABS 9.1*  --   --   HGB 16.4 17.7* 14.4  HCT 46.2 52.0 41.9  MCV 87.7  --  88.2  PLT 304  --  295   Cardiac Enzymes: No results found for this basename: CKTOTAL, CKMB, CKMBINDEX, TROPONINI,  in the last 168 hours BNP: No components found with this basename: POCBNP,  CBG: No results found for this basename: GLUCAP,  in the last 168 hours  No results found for this or any previous visit (from the past 240 hour(s)).   Studies:              All Imaging reviewed and is as per above notation   Scheduled Meds: . folic acid  1 mg Oral Daily  . multivitamin with minerals  1 tablet Oral Daily  . pantoprazole (PROTONIX) IV  40 mg Intravenous Q12H  . pneumococcal 23 valent vaccine  0.5 mL Intramuscular Tomorrow-1000  . thiamine  100 mg Oral Daily   Or  . thiamine  100 mg Intravenous Daily   Continuous Infusions: . sodium chloride 505 mL (05/08/13 1146)     Assessment/Plan: 1. Likely ETOH  induced pancreatitis-Lipase dropped  2419-->637.  [place] denotes no GB stones. Saline lock, graduate diet 8/24  Pain control with IV dilaudid .5-1 mg q3prn.   2. Heavy ETOH use-CIWA was 0.  D/c ativan, Ciwa checks-continue Thiamine/Folic acid 3. Leukocytosis-unlikely infectious-probable SIRs from Pancreatitis-  Expectant management 4. Hemoconcentration 2/2 to #1 5. Tob abuse-counselled to quit-offer patch prn  Code Status: Full Family Communication: None at bedside Disposition Plan: inpatient-probably d/c home am 8/25   Pleas Koch, MD  [facility] Pager [phone] 05/08/2013, 3:02 PM    LOS: 2 days

## 2013-05-08 NOTE — Progress Notes (Signed)
Entered room to find patient standing by sink. Patient has clothes in basin washing clothes. I asked patient why he was washing clothes. He states because they are dirty. No evidence of incontinence. Dilaudid given PRN for pain. Patient has 8/9 out of 10 for pain. After dilaudid, patient falls asleep.

## 2013-05-09 ENCOUNTER — Encounter: Payer: Self-pay | Admitting: Family Medicine

## 2013-05-09 NOTE — Progress Notes (Signed)
Cindie Laroche to be D/C'd Home per MD order.  Discussed with the patient and all questions fully answered.    Medication List    Notice   You have not been prescribed any medications.      VVS, Skin clean, dry and intact without evidence of skin break down, no evidence of skin tears noted. IV catheter discontinued intact. Site without signs and symptoms of complications. Dressing and pressure applied.  An After Visit Summary was printed and given to the patient. Patient escorted via WC, and D/C home via bus with bus pass from SW.  [place], [place] D 05/09/2013 9:56 AM

## 2013-05-09 NOTE — Discharge Summary (Signed)
Physician Discharge Summary  TRAMPUS MCQUERRY BMW:[id number] DOB: May 20, 1981 DOA: 05/06/2013  PCP: No PCP Per Patient  Admit date: 05/06/2013 Discharge date: 05/09/2013  Time-35  Recommendations for Outpatient Follow-up:  1. Recommend absolute abstinence.-he seems to have an interest in this  2. Work note provided  Discharge Diagnoses:  Principal Problem:   Acute pancreatitis Active Problems:   Dehydration   Alcohol abuse   Nicotine abuse   Discharge Condition: good  Diet recommendation: regular-limit fats  Filed Weights   05/07/13 0140  Weight: 66.4 kg (146 lb 6.2 oz)    History of present illness:  32 year old Alvarez, admitted 05/07/2013 with known alcohol abuse for 17 years admitted with abdominal pain, found to have lipase 2419 and acute pancreatitis   Hospital Course:  1. Likely ETOH induced pancreatitis-Lipase dropped 2419-->637. [place] denotes no GB stones. Saline lock, graduate diet 8/24 which was tolerated very well and he had no further pain.  NO pain meds on d/c as out ogf pain and Substance abuse h/o 2. Heavy ETOH use-CIWA was 0. D/c ativan, Ciwa checks-continue Thiamine/Folic acid 3. Leukocytosis-unlikely infectious-probable SIRs from Pancreatitis- Expectant management 4. Hemoconcentration 2/2 to #1 5. Tob abuse-counselled to quit-offer patch prn   Consultants:  None Procedures:  Ultrasound abdomen 8/23 Antibiotics:  None  Discharge Exam: Filed Vitals:   05/08/13 2231  BP: 127/82  Pulse: 66  Temp: 98.1 F (36.7 C)  Resp: 18   Alert pleasant oriented, in NAD  General: EOMI, NCAT  Cardiovascular: s1s2 no m/r/g Respiratory: clear  Discharge Instructions  Discharge Orders   Future Orders Complete By Expires   Diet - low sodium heart healthy  As directed    Discharge instructions  As directed    Comments:     You were cared for by a hospitalist during your hospital stay. If you have any questions about your discharge medications or the care you  received while you were in the hospital after you are discharged, you can call the unit and asked to speak with the hospitalist on call if the hospitalist that took care of you is not available. Once you are discharged, your primary care physician will handle any further medical issues. Please note that NO REFILLS for any discharge medications will be authorized once you are discharged, as it is imperative that you return to your primary care physician (or establish a relationship with a primary care physician if you do not have one) for your aftercare needs so that they can reassess your need for medications and monitor your lab values. If you do not have a primary care physician, you can call [phone] for a physician referral.   Increase activity slowly  As directed        Medication List    Notice   You have not been prescribed any medications.     No Known Allergies    The results of significant diagnostics from this hospitalization (including imaging, microbiology, ancillary and laboratory) are listed below for reference.    Significant Diagnostic Studies: US Abdomen Complete  05/07/2013   *RADIOLOGY REPORT*  Clinical Data:  Acute alcoholic pancreatitis.  Abdominal pain, nausea and vomiting.  Lipase 24 19.  COMPLETE ABDOMINAL ULTRASOUND  Comparison:  06/03/2014ultrasound, 02/15/2013 CT.  Findings:  Gallbladder:  No gallstones, gallbladder wall thickening, or pericholecystic fluid.  Common bile duct:  Normal caliber, measured at 1.7 mm.  Liver:  Mild diffuse fatty infiltration with focal sparing around the gallbladder fossa.  Minimal fluid demonstrated around the  liver edge.  Limited color flow Doppler imaging of the main portal vein shows flow in the appropriate direction.  IVC:  Appears normal.  Pancreas:  Moderate enlargement of pancreatic head with heterogeneous appearance consistent with history of pancreatitis. No visualized pancreatic ductal dilatation.  Spleen:  Spleen length measures  5.5 cm.  Normal parenchymal echotexture.  Small amount of fluid around the spleen.  Right Kidney:  Right kidney measures 10.8 cm length.  No hydronephrosis.  Left Kidney:  Left kidney measures 11.3 cm length.  No hydronephrosis.  Abdominal aorta:  No aneurysm identified.  IMPRESSION: Heterogeneous appearance and fullness in the head of the pancreas consistent with history of pancreatitis.  Small amount of fluid in the upper abdomen is likely related to pancreatitis.  Diffuse fatty infiltration of the liver with focal sparing.  Gallbladder is normal.   Original Report Authenticated By: Burman Nieves, M.D.    Microbiology: No results found for this or any previous visit (from the past 240 hour(s)).   Labs: Basic Metabolic Panel:  Recent Labs Lab 05/06/13 2027 05/06/13 2044 05/07/13 0628  NA 137 139 138  K 4.3 4.1 4.0  CL 98 101 103  CO2 27  --  24  GLUCOSE 119* 120* 97  BUN 12 12 9   CREATININE 0.94 1.00 0.82  CALCIUM 9.7  --  8.7   Liver Function Tests:  Recent Labs Lab 05/06/13 2027  AST 37  ALT 26  ALKPHOS 116  BILITOT 0.7  PROT 8.8*  ALBUMIN 4.5    Recent Labs Lab 05/06/13 2027 05/07/13 0628 05/08/13 0530 05/09/13 0640  LIPASE 2419* 1921* 637* 272*   No results found for this basename: AMMONIA,  in the last 168 hours CBC:  Recent Labs Lab 05/06/13 2027 05/06/13 2044 05/07/13 0628  WBC 11.2*  --  12.6*  NEUTROABS 9.1*  --   --   HGB 16.4 17.7* 14.4  HCT 46.2 52.0 41.9  MCV 87.7  --  88.2  PLT 304  --  295   Cardiac Enzymes: No results found for this basename: CKTOTAL, CKMB, CKMBINDEX, TROPONINI,  in the last 168 hours BNP: BNP (last 3 results) No results found for this basename: PROBNP,  in the last 8760 hours CBG: No results found for this basename: GLUCAP,  in the last 168 hours     Signed:  Rhetta Mura  [facility] 05/09/2013, 8:Antonio AM

## 2013-05-09 NOTE — Clinical Social Work Note (Signed)
CSW gave patient bus pass for transportation home. CSW signing off.   Roddie Mc, [place], [place], [phone]

## 2013-05-09 NOTE — Progress Notes (Signed)
Utilization review complete. Dc home, No CM needs identified. Isidoro Donning RN CCM Case Mgmt phone [phone]

## 2013-06-21 ENCOUNTER — Encounter (HOSPITAL_COMMUNITY): Payer: Self-pay | Admitting: Emergency Medicine

## 2013-06-21 ENCOUNTER — Emergency Department (HOSPITAL_COMMUNITY): Payer: Self-pay

## 2013-06-21 ENCOUNTER — Inpatient Hospital Stay (HOSPITAL_COMMUNITY): Payer: Self-pay

## 2013-06-21 ENCOUNTER — Inpatient Hospital Stay (HOSPITAL_COMMUNITY)
Admission: EM | Admit: 2013-06-21 | Discharge: 2013-06-25 | DRG: 440 | Disposition: A | Discharge status: Home / Self Care | Payer: Self-pay | Attending: Internal Medicine | Admitting: Internal Medicine

## 2013-06-21 DIAGNOSIS — K7689 Other specified diseases of liver: Secondary | ICD-10-CM | POA: Diagnosis present

## 2013-06-21 DIAGNOSIS — F10239 Alcohol dependence with withdrawal, unspecified: Secondary | ICD-10-CM | POA: Diagnosis present

## 2013-06-21 DIAGNOSIS — F1010 Alcohol abuse, uncomplicated: Secondary | ICD-10-CM

## 2013-06-21 DIAGNOSIS — Z720 Tobacco use: Secondary | ICD-10-CM

## 2013-06-21 DIAGNOSIS — F29 Unspecified psychosis not due to a substance or known physiological condition: Secondary | ICD-10-CM | POA: Diagnosis present

## 2013-06-21 DIAGNOSIS — K859 Acute pancreatitis without necrosis or infection, unspecified: Principal | ICD-10-CM

## 2013-06-21 DIAGNOSIS — F1020 Alcohol dependence, uncomplicated: Secondary | ICD-10-CM | POA: Diagnosis present

## 2013-06-21 DIAGNOSIS — F259 Schizoaffective disorder, unspecified: Secondary | ICD-10-CM | POA: Diagnosis present

## 2013-06-21 DIAGNOSIS — F17200 Nicotine dependence, unspecified, uncomplicated: Secondary | ICD-10-CM | POA: Diagnosis present

## 2013-06-21 DIAGNOSIS — D72829 Elevated white blood cell count, unspecified: Secondary | ICD-10-CM

## 2013-06-21 DIAGNOSIS — F1210 Cannabis abuse, uncomplicated: Secondary | ICD-10-CM | POA: Diagnosis present

## 2013-06-21 DIAGNOSIS — R440 Auditory hallucinations: Secondary | ICD-10-CM | POA: Diagnosis present

## 2013-06-21 LAB — CBC WITH DIFFERENTIAL/PLATELET
Basophils Relative: 0 % (ref 0–1)
Eosinophils Absolute: 0.2 10*3/uL (ref 0.0–0.7)
Hemoglobin: 15.7 g/dL (ref 13.0–17.0)
MCH: 29.8 pg (ref 26.0–34.0)
MCHC: 34.3 g/dL (ref 30.0–36.0)
Monocytes Absolute: 0.7 10*3/uL (ref 0.1–1.0)
Monocytes Relative: 5 % (ref 3–12)
Neutrophils Relative %: 84 % — ABNORMAL HIGH (ref 43–77)

## 2013-06-21 LAB — RAPID URINE DRUG SCREEN, HOSP PERFORMED
Amphetamines: NOT DETECTED
Barbiturates: NOT DETECTED
Benzodiazepines: NOT DETECTED
Cocaine: NOT DETECTED
Opiates: POSITIVE — AB
Tetrahydrocannabinol: POSITIVE — AB

## 2013-06-21 LAB — COMPREHENSIVE METABOLIC PANEL
Albumin: 4.1 g/dL (ref 3.5–5.2)
BUN: 11 mg/dL (ref 6–23)
Creatinine, Ser: 0.89 mg/dL (ref 0.50–1.35)
Potassium: 3.8 mEq/L (ref 3.5–5.1)
Total Protein: 8.3 g/dL (ref 6.0–8.3)

## 2013-06-21 LAB — URINALYSIS W MICROSCOPIC + REFLEX CULTURE
Leukocytes, UA: NEGATIVE
Nitrite: NEGATIVE
Specific Gravity, Urine: 1.029 (ref 1.005–1.030)
Urobilinogen, UA: 0.2 mg/dL (ref 0.0–1.0)
pH: 6 (ref 5.0–8.0)

## 2013-06-21 LAB — LIPASE, BLOOD: Lipase: 1371 U/L — ABNORMAL HIGH (ref 11–59)

## 2013-06-21 LAB — TROPONIN I: Troponin I: <0.3 ng/mL (ref <0.30)

## 2013-06-21 MED ORDER — ACETAMINOPHEN 650 MG RE SUPP: 650.0000 mg | Freq: Four times a day (QID) | RECTAL | Status: DC | PRN

## 2013-06-21 MED ORDER — LORAZEPAM 1 MG PO TABS
1.0000 mg | ORAL_TABLET | Freq: Four times a day (QID) | ORAL | Status: AC | PRN
  Administered 2013-06-23 (×2): 1 mg via ORAL
  Filled 2013-06-21: qty 1

## 2013-06-21 MED ORDER — ACETAMINOPHEN 325 MG PO TABS
650.0000 mg | ORAL_TABLET | Freq: Four times a day (QID) | ORAL | Status: DC | PRN
  Administered 2013-06-24: 650 mg via ORAL
  Filled 2013-06-21: qty 2

## 2013-06-21 MED ORDER — ADULT MULTIVITAMIN W/MINERALS CH
1.0000 | ORAL_TABLET | Freq: Every day | ORAL | Status: DC
  Filled 2013-06-21: qty 1

## 2013-06-21 MED ORDER — ONDANSETRON HCL 4 MG PO TABS: 4.0000 mg | ORAL_TABLET | Freq: Four times a day (QID) | ORAL | Status: DC | PRN

## 2013-06-21 MED ORDER — ENOXAPARIN SODIUM 40 MG/0.4ML ~~LOC~~ SOLN
40.0000 mg | SUBCUTANEOUS | Status: DC
  Administered 2013-06-21 – 2013-06-24 (×4): 40 mg via SUBCUTANEOUS
  Filled 2013-06-21 (×5): qty 0.4

## 2013-06-21 MED ORDER — INFLUENZA VAC SPLIT QUAD 0.5 ML IM SUSP
0.5000 mL | INTRAMUSCULAR | Status: AC
  Administered 2013-06-22: 0.5 mL via INTRAMUSCULAR
  Filled 2013-06-21 (×2): qty 0.5

## 2013-06-21 MED ORDER — VITAMIN B-1 100 MG PO TABS
100.0000 mg | ORAL_TABLET | Freq: Every day | ORAL | Status: DC
  Administered 2013-06-21 – 2013-06-25 (×4): 100 mg via ORAL
  Filled 2013-06-21 (×5): qty 1

## 2013-06-21 MED ORDER — ADULT MULTIVITAMIN W/MINERALS CH
1.0000 | ORAL_TABLET | Freq: Every day | ORAL | Status: DC
  Administered 2013-06-21 – 2013-06-25 (×4): 1 via ORAL
  Filled 2013-06-21 (×5): qty 1

## 2013-06-21 MED ORDER — ONDANSETRON HCL 4 MG/2ML IJ SOLN
4.0000 mg | Freq: Once | INTRAMUSCULAR | Status: AC
  Administered 2013-06-21: 4 mg via INTRAVENOUS

## 2013-06-21 MED ORDER — FOLIC ACID 1 MG PO TABS
1.0000 mg | ORAL_TABLET | Freq: Every day | ORAL | Status: DC
  Filled 2013-06-21: qty 1

## 2013-06-21 MED ORDER — VITAMIN B-1 100 MG PO TABS
100.0000 mg | ORAL_TABLET | Freq: Every day | ORAL | Status: DC
  Filled 2013-06-21: qty 1

## 2013-06-21 MED ORDER — LORAZEPAM 1 MG PO TABS
0.0000 mg | ORAL_TABLET | Freq: Four times a day (QID) | ORAL | Status: AC
  Administered 2013-06-22: 2 mg via ORAL
  Administered 2013-06-23: 1 mg via ORAL
  Filled 2013-06-21: qty 2
  Filled 2013-06-21: qty 1
  Filled 2013-06-21: qty 2

## 2013-06-21 MED ORDER — HYDROMORPHONE HCL PF 1 MG/ML IJ SOLN
1.0000 mg | INTRAMUSCULAR | Status: DC | PRN
  Administered 2013-06-21 – 2013-06-24 (×13): 1 mg via INTRAVENOUS
  Filled 2013-06-21 (×13): qty 1

## 2013-06-21 MED ORDER — IOHEXOL 300 MG/ML  SOLN
50.0000 mL | Freq: Once | INTRAMUSCULAR | Status: AC | PRN
  Administered 2013-06-21: 50 mL via ORAL

## 2013-06-21 MED ORDER — THIAMINE HCL 100 MG/ML IJ SOLN
100.0000 mg | Freq: Every day | INTRAMUSCULAR | Status: DC
  Filled 2013-06-21: qty 1

## 2013-06-21 MED ORDER — HYDROMORPHONE HCL PF 2 MG/ML IJ SOLN
2.0000 mg | Freq: Once | INTRAMUSCULAR | Status: AC
  Administered 2013-06-21: 2 mg via INTRAVENOUS
  Filled 2013-06-21: qty 1

## 2013-06-21 MED ORDER — SODIUM CHLORIDE 0.9 % IV SOLN
INTRAVENOUS | Status: DC
  Administered 2013-06-21 – 2013-06-24 (×4): via INTRAVENOUS
  Filled 2013-06-21 (×14): qty 1000

## 2013-06-21 MED ORDER — HYDROMORPHONE HCL PF 1 MG/ML IJ SOLN: 1.0000 mg | INTRAMUSCULAR | Status: DC | PRN

## 2013-06-21 MED ORDER — ONDANSETRON HCL 4 MG/2ML IJ SOLN
INTRAMUSCULAR | Status: AC
  Filled 2013-06-21: qty 2

## 2013-06-21 MED ORDER — LORAZEPAM 2 MG/ML IJ SOLN
1.0000 mg | Freq: Four times a day (QID) | INTRAMUSCULAR | Status: AC | PRN
  Administered 2013-06-22: 1 mg via INTRAVENOUS
  Filled 2013-06-21: qty 1

## 2013-06-21 MED ORDER — ONDANSETRON HCL 4 MG/2ML IJ SOLN: 4.0000 mg | Freq: Four times a day (QID) | INTRAMUSCULAR | Status: DC | PRN

## 2013-06-21 MED ORDER — LORAZEPAM 1 MG PO TABS: 0.0000 mg | ORAL_TABLET | Freq: Two times a day (BID) | ORAL | Status: DC

## 2013-06-21 MED ORDER — FOLIC ACID 1 MG PO TABS
1.0000 mg | ORAL_TABLET | Freq: Every day | ORAL | Status: DC
  Administered 2013-06-21 – 2013-06-25 (×4): 1 mg via ORAL
  Filled 2013-06-21 (×5): qty 1

## 2013-06-21 MED ORDER — LORAZEPAM 2 MG/ML IJ SOLN: 1.0000 mg | Freq: Four times a day (QID) | INTRAMUSCULAR | Status: DC | PRN

## 2013-06-21 MED ORDER — ONDANSETRON HCL 4 MG/2ML IJ SOLN: 4.0000 mg | Freq: Three times a day (TID) | INTRAMUSCULAR | Status: DC | PRN

## 2013-06-21 MED ORDER — THIAMINE HCL 100 MG/ML IJ SOLN
100.0000 mg | Freq: Every day | INTRAMUSCULAR | Status: DC
  Administered 2013-06-22: 100 mg via INTRAVENOUS
  Filled 2013-06-21 (×4): qty 1

## 2013-06-21 MED ORDER — LORAZEPAM 1 MG PO TABS: 1.0000 mg | ORAL_TABLET | Freq: Four times a day (QID) | ORAL | Status: DC | PRN

## 2013-06-21 MED ORDER — FAMOTIDINE IN NACL 20-0.9 MG/50ML-% IV SOLN
20.0000 mg | Freq: Once | INTRAVENOUS | Status: AC
  Administered 2013-06-21: 20 mg via INTRAVENOUS
  Filled 2013-06-21: qty 50

## 2013-06-21 MED ORDER — IOHEXOL 300 MG/ML  SOLN
80.0000 mL | Freq: Once | INTRAMUSCULAR | Status: AC | PRN
  Administered 2013-06-21: 80 mL via INTRAVENOUS

## 2013-06-21 NOTE — ED Provider Notes (Signed)
CSN: [id number]     Arrival date & time 06/21/13  1312 History   First MD Initiated Contact with Patient 06/21/13 1332     Chief Complaint  Patient presents with  . Abdominal Pain  . Nausea  . Emesis   (Consider location/radiation/quality/duration/timing/severity/associated sxs/prior Treatment) HPI  Antonio Alvarez is a 32 y.o. male with PMH significant for alcoholic pancreatitis complaining of LUQ (note that this contradicts triage note) onset yesterday rated at 7/10 with 6x episodes of NBNB emesis stating today. Pt states this feels different than prior pancreatitis exacerbations. Pt denies CP, SOB, FH of early cardiac death, fever, melena, hematochezia, excessive NSAID use. He endorses dry cough and sick contacts (URI symptoms).  Pt stes he has cut back on drinking. Denies h/o DTs or seizure from EtOH withdrawal.   Past Medical History  Diagnosis Date  . Abscess     hx of abscess on neck  . Alcohol abuse   . Cellulitis   . Hyponatremia   . Leukocytosis   . Pancreatitis    Past Surgical History  Procedure Laterality Date  . Abscess removed from neck     Family History  Problem Relation Age of Onset  . Alcohol abuse Father    History  Substance Use Topics  . Smoking status: Current Every Day Smoker -- 1.00 packs/day for 1 years    Types: Cigarettes  . Smokeless tobacco: Current User  . Alcohol Use: 21.0 oz/week    35 Cans of beer per week    Review of Systems 10 systems reviewed and found to be negative, except as noted in the HPI   Allergies  Review of patient's allergies indicates no known allergies.  Home Medications  No current outpatient prescriptions on file. BP 153/90  Pulse 52  Temp(Src) 98.7 F (37.1 C) (Oral)  Resp 17  Ht 5\' 11"  (1.803 m)  SpO2 100% Physical Exam  Nursing note and vitals reviewed. Constitutional: He is oriented to person, place, and time. He appears well-developed and well-nourished. No distress.  HENT:  Head: Normocephalic and  atraumatic.  Mouth/Throat: Oropharynx is clear and moist.  No tongue fasiculations   Eyes: Conjunctivae and EOM are normal. Pupils are equal, round, and reactive to light.  Neck: Normal range of motion.  Cardiovascular: Normal rate, regular rhythm and intact distal pulses.   Pulmonary/Chest: Effort normal and breath sounds normal. No stridor. No respiratory distress. He has no wheezes. He has no rales. He exhibits no tenderness.  Abdominal: Soft. Bowel sounds are normal. He exhibits no distension and no mass. There is no tenderness. There is no rebound and no guarding.  Musculoskeletal: Normal range of motion. He exhibits no edema.  Neurological: He is alert and oriented to person, place, and time.  Skin: No erythema.  Psychiatric: He has a normal mood and affect.    ED Course  Procedures (including critical care time) Labs Review Labs Reviewed  CBC WITH DIFFERENTIAL - Abnormal; Notable for the following:    WBC 13.4 (*)    Neutrophils Relative % 84 (*)    Neutro Abs 11.3 (*)    Lymphocytes Relative 9 (*)    All other components within normal limits  COMPREHENSIVE METABOLIC PANEL - Abnormal; Notable for the following:    Glucose, Bld 118 (*)    Alkaline Phosphatase 123 (*)    All other components within normal limits  LIPASE, BLOOD - Abnormal; Notable for the following:    Lipase 1371 (*)    All  other components within normal limits  TROPONIN I  ETHANOL   Imaging Review Dg Chest 2 View  06/21/2013   CLINICAL DATA:  Abdominal pain with vomiting and nausea.  EXAM: CHEST  2 VIEW  COMPARISON:  Chest x-ray dated 08/14/2008  FINDINGS: The heart size and mediastinal contours are within normal limits. Both lungs are clear. The visualized skeletal structures are unremarkable.  IMPRESSION: No active cardiopulmonary disease.   Electronically Signed   By: Geanie Cooley M.D.   On: 06/21/2013 14:54     Date: 06/21/2013  Rate: 56  Rhythm: sinus bradycardia  QRS Axis: normal  Intervals:  normal  ST/T Wave abnormalities: normal  Conduction Disutrbances:none  Narrative Interpretation:   Old EKG Reviewed: unchanged   MDM   1. Acute pancreatitis     Filed Vitals:   06/21/13 1317  BP: 153/90  Pulse: 52  Temp: 98.7 F (37.1 C)  TempSrc: Oral  Resp: 17  Height: 5\' 11"  (1.803 m)  SpO2: 100%     [name] is a 32 y.o. male with PMH of alcohol induced pancreatitis  with LUQ pain and nausea with vomiting. Abd exam is non surgical. ECG non ischemic and trop is negative. Lipase is elevated at 1300. Pt states that he has never had DTs or withdrawal seizures. But CIWA protocol is initiated.   Medications  HYDROmorphone (DILAUDID) injection 2 mg (not administered)  LORazepam (ATIVAN) tablet 1 mg (not administered)    Or  LORazepam (ATIVAN) injection 1 mg (not administered)  thiamine (VITAMIN B-1) tablet 100 mg (not administered)    Or  thiamine (B-1) injection 100 mg (not administered)  folic acid (FOLVITE) tablet 1 mg (not administered)  multivitamin with minerals tablet 1 tablet (not administered)  LORazepam (ATIVAN) tablet 0-4 mg (not administered)    Followed by  LORazepam (ATIVAN) tablet 0-4 mg (not administered)  ondansetron (ZOFRAN) injection 4 mg (not administered)  HYDROmorphone (DILAUDID) injection 1 mg (not administered)  ondansetron (ZOFRAN) injection 4 mg (4 mg Intravenous Given 06/21/13 1344)  famotidine (PEPCID) IVPB 20 mg (20 mg Intravenous New Bag/Given 06/21/13 1437)    Note: Portions of this report may have been transcribed using voice recognition software. Every effort was made to ensure accuracy; however, inadvertent computerized transcription errors may be present    Wynetta Emery, PA-C 06/21/13 1538

## 2013-06-21 NOTE — Progress Notes (Signed)
Utilization Review completed.  Jessicalynn Deshong RN CM  

## 2013-06-21 NOTE — Progress Notes (Signed)
[facility] provided pt with list of primary care resources and a [facility] [employer] application.

## 2013-06-21 NOTE — ED Notes (Signed)
Unable to get meds out of pyxis. Pharmacy notified.

## 2013-06-21 NOTE — ED Provider Notes (Signed)
Medical screening examination/treatment/procedure(s) were conducted as a shared visit with non-physician practitioner(s) and myself.  I personally evaluated the patient during the encounter.   Please see my separate note.     Candyce Churn, MD 06/21/13 1620

## 2013-06-21 NOTE — Progress Notes (Signed)
Patient received from ED , ambulated to bed without issue, IV in progress, oriented to room/tv, declined safety video since was here as inpatient in August.  Having 5/10 abdominal discomfort.  Drinking contrast for the abdominal ct.

## 2013-06-21 NOTE — ED Provider Notes (Signed)
Medical screening examination/treatment/procedure(s) were conducted as a shared visit with non-physician practitioner(s) and myself.  I personally evaluated the patient during the encounter  32 year-old presenting with abdominal pain. Abdomen soft but tender in left upper quadrant and epigastrium. No rebound, rigidity, guarding. Otherwise in no apparent distress. Lipase significantly elevated. Will be admitted for pancreatitis.  Clinical Impression: 1. Acute pancreatitis       Candyce Churn, MD 06/21/13 [phone]

## 2013-06-21 NOTE — H&P (Signed)
[facility] History and Physical  Antonio Alvarez UJW:[id number] DOB: 1981/02/13 DOA: 06/21/2013   PCP: No PCP Per Patient   Chief Complaint: abdominal pain  HPI:  32 year old male with a history of alcohol dependence, pancreatitis presents with one-day history of abdominal pain and intractable nausea the patient states that and vomiting that began on the day of admission. the patient states that his last drink was approximately 48 hours prior to admission. On 06/19/2013, the patient states that he drank approximately 5 x 40 ounce beers. He states that he drinks beers to help him go to sleep. Normally, the patient states that he drinks approximately 3 beers everyday for the past 17 years. He also endorses use of cannabis. He denies any other illegal drug use. He denies any over-the-counter supplements or over-the-counter medications. He takes no prescription meds. He denies any headache, dizziness, syncope, chest pain, shortness of breath, palpitations, dysuria, hematuria, hematochezia, melena, hematemesis, or diarrhea. He smokes approximately one pack per day x17 years.   The patient was discharged from the hospital on 05/09/2013 after an episode of alcoholic pancreatitis. Abdominal ultrasound on 05/07/2013 showed fatty liver, normal gallbladder, moderate pancreatic head enlargement. In emergency department, the patient was found to have WBC 13.4, unremarkable BMP, lipase 1371 with normal LFTs. Alcohol level was negative. The patient was given 3 mg of Dilaudid and started on IV fluids.  Assessment/Plan:  acute pancreatitis  -Most likely due to alcohol use  -Keep n.p.o.  -Check lipids  -CT abdomen pelvis as patient has left upper quadrant pain with leukocytosis to rule out other etiologies  -IV fluids  -Urine drug screen   polysubstance abuse -Place patient on alcohol withdrawal protocol -Patient does not want nicotine patch Leukocytosis -Likely due to the patient's acute medical  condition -Check urinalysis with reflex 2 urine culture Chest x-ray negative for any infiltrates-        Past Medical History  Diagnosis Date  . Abscess     hx of abscess on neck  . Alcohol abuse   . Cellulitis   . Hyponatremia   . Leukocytosis   . Pancreatitis    Past Surgical History  Procedure Laterality Date  . Abscess removed from neck     Social History:  reports that he has been smoking Cigarettes.  He has a 1 pack-year smoking history. He uses smokeless tobacco. He reports that he drinks about 21.0 ounces of alcohol per week. He reports that he uses illicit drugs (Marijuana).   Family History  Problem Relation Age of Onset  . Alcohol abuse Father      No Known Allergies    Prior to Admission medications   Not on File    Review of Systems:  Constitutional:  No weight loss, night sweats, Fevers, chills, fatigue.  Head&Eyes: No headache.  No vision loss.  No eye pain or scotoma ENT:  No Difficulty swallowing,Tooth/dental problems,Sore throat,   Cardio-vascular:  No chest pain, Orthopnea, PND, swelling in lower extremities,  dizziness, palpitations  GI:  No  abdominal pain, diarrhea, loss of appetite, hematochezia, melena, heartburn, indigestion, Resp:  No shortness of breath with exertion or at rest. No cough. No coughing up of blood .No wheezing.No chest wall deformity  Skin:  no rash or lesions.  GU:  no dysuria, change in color of urine, no urgency or frequency. No flank pain.  Musculoskeletal:  No joint pain or swelling. No decreased range of motion. No back pain.  Psych:  No change in mood  or affect. No depression or anxiety. Neurologic: No headache, no dysesthesia, no focal weakness, no vision loss. No syncope  Physical Exam: Filed Vitals:   06/21/13 1317  BP: 153/90  Pulse: 52  Temp: 98.7 F (37.1 C)  TempSrc: Oral  Resp: 17  Height: 5\' 11"  (1.803 m)  SpO2: 100%   General:  A&O x 3, NAD, nontoxic,  pleasant/cooperative Head/Eye: No conjunctival hemorrhage, no icterus, [place]/AT, No nystagmus ENT:  No icterus,  No thrush, poor dentition, no pharyngeal exudate Neck:  No masses, no lymphadenpathy, no bruits CV:  RRR, no rub, no gallop, no S3 Lung:  bibasilar rales. No wheezing. Good air movement. Abdomen: soft/NT, +BS, nondistended, no peritoneal signs Ext: No cyanosis, No rashes, No petechiae, No lymphangitis, No edema Neuro: CNII-XII intact, strength 4/5 in bilateral upper and lower extremities, no dysmetria; no asterixis  Labs on Admission:  Basic Metabolic Panel:  Recent Labs Lab 06/21/13 1330  NA 136  K 3.8  CL 96  CO2 29  GLUCOSE 118*  BUN 11  CREATININE 0.89  CALCIUM 9.4   Liver Function Tests:  Recent Labs Lab 06/21/13 1330  AST 24  ALT 17  ALKPHOS 123*  BILITOT 0.7  PROT 8.3  ALBUMIN 4.1    Recent Labs Lab 06/21/13 1330  LIPASE 1371*   No results found for this basename: AMMONIA,  in the last 168 hours CBC:  Recent Labs Lab 06/21/13 1330  WBC 13.4*  NEUTROABS 11.3*  HGB 15.7  HCT 45.8  MCV 87.1  PLT 337   Cardiac Enzymes:  Recent Labs Lab 06/21/13 1330  TROPONINI <0.30   BNP: No components found with this basename: POCBNP,  CBG: No results found for this basename: GLUCAP,  in the last 168 hours  Radiological Exams on Admission: Dg Chest 2 View  06/21/2013   CLINICAL DATA:  Abdominal pain with vomiting and nausea.  EXAM: CHEST  2 VIEW  COMPARISON:  Chest x-ray dated 08/14/2008  FINDINGS: The heart size and mediastinal contours are within normal limits. Both lungs are clear. The visualized skeletal structures are unremarkable.  IMPRESSION: No active cardiopulmonary disease.   Electronically Signed   By: Geanie Cooley M.D.   On: 06/21/2013 14:54    EKG: Independently reviewed. Sinus, early repolarization    Time spent:60 minutes Code Status:   FULL Family Communication:  No Family at bedside   Jenel Gierke, DO  [facility] Pager [phone]  If 7PM-7AM, please contact night-coverage [email or web address] Password [email or web address] 06/21/2013, 3:58 PM

## 2013-06-21 NOTE — ED Notes (Signed)
Patient transported to X-ray 

## 2013-06-21 NOTE — ED Notes (Signed)
Pt c/o LLQ pain that started yesterday. Pt states he is nauseated and vomited 4 times this morning.  Pt denies constipation or diarrhea or trouble urinating.

## 2013-06-22 LAB — CBC
HCT: 41.8 % (ref 39.0–52.0)
Hemoglobin: 14 g/dL (ref 13.0–17.0)
MCHC: 33.5 g/dL (ref 30.0–36.0)
RBC: 4.76 MIL/uL (ref 4.22–5.81)
WBC: 10.9 10*3/uL — ABNORMAL HIGH (ref 4.0–10.5)

## 2013-06-22 LAB — BASIC METABOLIC PANEL
BUN: 8 mg/dL (ref 6–23)
Chloride: 99 mEq/L (ref 96–112)
Creatinine, Ser: 0.9 mg/dL (ref 0.50–1.35)
GFR calc Af Amer: >90 mL/min (ref >90)
GFR calc non Af Amer: >90 mL/min (ref >90)
Potassium: 3.8 mEq/L (ref 3.5–5.1)
Sodium: 135 mEq/L (ref 135–145)

## 2013-06-22 LAB — LIPID PANEL: VLDL: 34 mg/dL (ref 0–40)

## 2013-06-22 LAB — MAGNESIUM: Magnesium: 1.8 mg/dL (ref 1.5–2.5)

## 2013-06-22 MED ORDER — TRAZODONE HCL 50 MG PO TABS
50.0000 mg | ORAL_TABLET | Freq: Every evening | ORAL | Status: DC | PRN
  Administered 2013-06-22 – 2013-06-25 (×2): 50 mg via ORAL
  Filled 2013-06-22 (×2): qty 1

## 2013-06-22 NOTE — Progress Notes (Signed)
[facility] PROGRESS NOTE  Antonio Alvarez VWU:[id number] DOB: 12-26-1980 DOA: 06/21/2013 PCP: No PCP Per Patient  HPI: 32 year old male with a history of alcohol dependence, pancreatitis presents with one-day history of abdominal pain and intractable nausea the patient states that and vomiting that began on the day of admission. the patient states that his last drink was approximately 48 hours prior to admission. On 06/19/2013, the patient states that he drank approximately 5 x 40 ounce beers. He states that he drinks beers to help him go to sleep. Normally, the patient states that he drinks approximately 3 beers everyday for the past 17 years. He also endorses use of cannabis. He denies any other illegal drug use. He denies any over-the-counter supplements or over-the-counter medications. He takes no prescription meds. He denies any headache, dizziness, syncope, chest pain, shortness of breath, palpitations, dysuria, hematuria, hematochezia, melena, hematemesis, or diarrhea. He smokes approximately one pack per day x17 years. The patient was discharged from the hospital on 05/09/2013 after an episode of alcoholic pancreatitis. Abdominal ultrasound on 05/07/2013 showed fatty liver, normal gallbladder, moderate pancreatic head enlargement. In emergency department, the patient was found to have WBC 13.4, unremarkable BMP, lipase 1371 with normal LFTs. Alcohol level was negative. The patient was given 3 mg of Dilaudid and started on IV fluids.   Assessment/Plan:  Acute pancreatitis -Most likely due to alcohol use, n.p.o. for now -IV fluids  Polysubstance abuse  -Place patient on alcohol withdrawal protocol  -Patient does not want nicotine patch  Leukocytosis  -Likely due to the patient's acute medical condition, improving.  DVT Prophylaxis - Lovenox  Code Status: Full Family Communication: none  Disposition Plan: inpatient  Consultants:  none   Procedures:  none  HPI/Subjective: -  complains of nausea/abdominal pain  Objective: Filed Vitals:   06/21/13 1317 06/21/13 1808 06/21/13 2200 06/22/13 0540  BP: 153/90 151/89 124/77 118/62  Pulse: 52 55 58 60  Temp: 98.7 F (37.1 C) 97.8 F (36.6 C) 98.3 F (36.8 C) 98.7 F (37.1 C)  TempSrc: Oral Oral Oral Oral  Resp: 17 18 16 16   Height: 5\' 11"  (1.803 m)     Weight:  65.681 kg (144 lb 12.8 oz)    SpO2: 100% 97% 97% 98%    Intake/Output Summary (Last 24 hours) at 06/22/13 0740 Last data filed at 06/22/13 0506  Gross per 24 hour  Intake   1313 ml  Output      0 ml  Net   1313 ml   Filed Weights   06/21/13 1808  Weight: 65.681 kg (144 lb 12.8 oz)   Exam:  General:  NAD  Cardiovascular: regular rate and rhythm, without MRG  Respiratory: good air movement, clear to auscultation throughout, no wheezing, ronchi or rales  Abdomen: soft, mild tenderness to palpation throughout  MSK: no peripheral edema  Neuro: non focal  Data Reviewed: Basic Metabolic Panel:  Recent Labs Lab 06/21/13 1330 06/22/13 0548  NA 136 135  K 3.8 3.8  CL 96 99  CO2 29 26  GLUCOSE 118* 81  BUN 11 8  CREATININE 0.89 0.90  CALCIUM 9.4 8.7  MG  --  1.8   Liver Function Tests:  Recent Labs Lab 06/21/13 1330  AST 24  ALT 17  ALKPHOS 123*  BILITOT 0.7  PROT 8.3  ALBUMIN 4.1    Recent Labs Lab 06/21/13 1330  LIPASE 1371*   CBC:  Recent Labs Lab 06/21/13 1330 06/22/13 0548  WBC 13.4* 10.9*  NEUTROABS  11.3*  --   HGB 15.7 14.0  HCT 45.8 41.8  MCV 87.1 87.8  PLT 337 302   Cardiac Enzymes:  Recent Labs Lab 06/21/13 1330  TROPONINI <0.30   Studies: Dg Chest 2 View  06/21/2013   CLINICAL DATA:  Abdominal pain with vomiting and nausea.  EXAM: CHEST  2 VIEW  COMPARISON:  Chest x-ray dated 08/14/2008  FINDINGS: The heart size and mediastinal contours are within normal limits. Both lungs are clear. The visualized skeletal structures are unremarkable.  IMPRESSION: No active cardiopulmonary disease.    Electronically Signed   By: Geanie Cooley M.D.   On: 06/21/2013 14:54   Ct Abdomen Pelvis W Contrast  06/21/2013   CLINICAL DATA:  Abdominal pain. Nausea.  History pancreatitis.  EXAM: CT ABDOMEN AND PELVIS WITH CONTRAST  TECHNIQUE: Multidetector CT imaging of the abdomen and pelvis was performed using the standard protocol following bolus administration of intravenous contrast.  CONTRAST:  80mL OMNIPAQUE IOHEXOL 300 MG/ML  SOLN  COMPARISON:  02/15/2013  FINDINGS: Hepatic steatosis. 5 mm hypodense lesion in the dome of the right hepatic lobe, image 8 of series 2.  Peripancreatic stranding noted, particularly in the vicinity of the pancreatic head. Small amount of stranding tracks along the porta hepatis and spleen.  Left kidney lower pole cyst appears stable. Gallbladder unremarkable. Adrenal glands normal.  No pseudocyst or pancreatic necrosis.  Appendix normal.  No dilated bowel.  Urinary bladder mild wall thickening is observed but appears to be chronic. Pelvic ascites noted.  IMPRESSION: 1. Acute pancreatitis without findings of pancreatic pseudocysts, pancreatic necrosis, or abscess. 2. Diffuse hepatic steatosis. A small hypodense lesion in the dome of the right hepatic lobe is stable and probably a small cyst. 3. Pelvic ascites. 4. Chronic mild wall thickening in the urinary bladder, query chronic cystitis.   Electronically Signed   By: Herbie Baltimore M.D.   On: 06/21/2013 19:58   Scheduled Meds: . enoxaparin (LOVENOX) injection  40 mg Subcutaneous Q24H  . folic acid  1 mg Oral Daily  . influenza vac split quadrivalent PF  0.5 mL Intramuscular Tomorrow-1000  . LORazepam  0-4 mg Oral Q6H   Followed by  . [START ON 06/23/2013] LORazepam  0-4 mg Oral Q12H  . multivitamin with minerals  1 tablet Oral Daily  . thiamine  100 mg Oral Daily   Or  . thiamine  100 mg Intravenous Daily   Continuous Infusions: . sodium chloride 0.9 % 1,000 mL with potassium chloride 20 mEq infusion 125 mL/hr at 06/22/13  1610   Principal Problem:   Acute pancreatitis Active Problems:   Alcohol abuse   Nicotine abuse  Time spent: 95  Pamella Pert, MD [facility] Pager [phone]. If 7 PM - 7 AM, please contact night-coverage at [email or web address], password [facility] 06/22/2013, 7:40 AM  LOS: 1 day

## 2013-06-23 LAB — URINE CULTURE

## 2013-06-23 MED ORDER — SENNOSIDES-DOCUSATE SODIUM 8.6-50 MG PO TABS
1.0000 | ORAL_TABLET | Freq: Two times a day (BID) | ORAL | Status: DC
  Administered 2013-06-23 – 2013-06-25 (×4): 1 via ORAL
  Filled 2013-06-23 (×6): qty 1

## 2013-06-23 NOTE — Progress Notes (Signed)
[facility] PROGRESS NOTE  Antonio Alvarez WUJ:[id number] DOB: Jan 07, 1981 DOA: 06/21/2013 PCP: No PCP Per Patient  Assessment/Plan:  Acute pancreatitis -Most likely due to alcohol use, third episode in the past few months. Slowly improving, tolerated a clear diet yesterday and will advance to fat free. Minimal nausea, no vomiting. - counseled for alcohol/tobacco cessation to minimize future flare-ups Polysubstance abuse  -Place patient on alcohol withdrawal protocol  -Patient does not want nicotine patch  Leukocytosis  -Likely due to pancreatitis DVT Prophylaxis - Lovenox  Code Status: Full Family Communication: none  Disposition Plan: home when medically ready 1-2 days  Consultants:  none   Procedures:  none  HPI/Subjective: - feeling better this morning  Objective: Filed Vitals:   06/22/13 0540 06/22/13 1400 06/22/13 2054 06/23/13 0557  BP: 118/62 120/77 118/71 111/70  Pulse: 60 60 57 1  Temp: 98.7 F (37.1 C) 97.3 F (36.3 C) 98.1 F (36.7 C) 97.8 F (36.6 C)  TempSrc: Oral Oral Oral Oral  Resp: 16 18 20 20   Height:      Weight:      SpO2: 98% 98% 99% 99%    Intake/Output Summary (Last 24 hours) at 06/23/13 1155 Last data filed at 06/23/13 1001  Gross per 24 hour  Intake 3102.5 ml  Output      3 ml  Net 3099.5 ml   Filed Weights   06/21/13 1808  Weight: 65.681 kg (144 lb 12.8 oz)   Exam:  General:  NAD  Cardiovascular: regular rate and rhythm, without MRG  Respiratory: good air movement, clear to auscultation throughout, no wheezing, ronchi or rales  Abdomen: soft, mild tenderness to palpation throughout  MSK: no peripheral edema  Neuro: non focal  Data Reviewed: Basic Metabolic Panel:  Recent Labs Lab 06/21/13 1330 06/22/13 0548  NA 136 135  K 3.8 3.8  CL 96 99  CO2 29 26  GLUCOSE 118* 81  BUN 11 8  CREATININE 0.89 0.90  CALCIUM 9.4 8.7  MG  --  1.8   Liver Function Tests:  Recent Labs Lab 06/21/13 1330  AST 24   ALT 17  ALKPHOS 123*  BILITOT 0.7  PROT 8.3  ALBUMIN 4.1    Recent Labs Lab 06/21/13 1330  LIPASE 1371*   CBC:  Recent Labs Lab 06/21/13 1330 06/22/13 0548  WBC 13.4* 10.9*  NEUTROABS 11.3*  --   HGB 15.7 14.0  HCT 45.8 41.8  MCV 87.1 87.8  PLT 337 302   Cardiac Enzymes:  Recent Labs Lab 06/21/13 1330  TROPONINI <0.30   Studies: Dg Chest 2 View  06/21/2013   CLINICAL DATA:  Abdominal pain with vomiting and nausea.  EXAM: CHEST  2 VIEW  COMPARISON:  Chest x-ray dated 08/14/2008  FINDINGS: The heart size and mediastinal contours are within normal limits. Both lungs are clear. The visualized skeletal structures are unremarkable.  IMPRESSION: No active cardiopulmonary disease.   Electronically Signed   By: Geanie Cooley M.D.   On: 06/21/2013 14:54   Ct Abdomen Pelvis W Contrast  06/21/2013   CLINICAL DATA:  Abdominal pain. Nausea.  History pancreatitis.  EXAM: CT ABDOMEN AND PELVIS WITH CONTRAST  TECHNIQUE: Multidetector CT imaging of the abdomen and pelvis was performed using the standard protocol following bolus administration of intravenous contrast.  CONTRAST:  80mL OMNIPAQUE IOHEXOL 300 MG/ML  SOLN  COMPARISON:  02/15/2013  FINDINGS: Hepatic steatosis. 5 mm hypodense lesion in the dome of the right hepatic lobe, image 8 of series  2.  Peripancreatic stranding noted, particularly in the vicinity of the pancreatic head. Small amount of stranding tracks along the porta hepatis and spleen.  Left kidney lower pole cyst appears stable. Gallbladder unremarkable. Adrenal glands normal.  No pseudocyst or pancreatic necrosis.  Appendix normal.  No dilated bowel.  Urinary bladder mild wall thickening is observed but appears to be chronic. Pelvic ascites noted.  IMPRESSION: 1. Acute pancreatitis without findings of pancreatic pseudocysts, pancreatic necrosis, or abscess. 2. Diffuse hepatic steatosis. A small hypodense lesion in the dome of the right hepatic lobe is stable and probably a  small cyst. 3. Pelvic ascites. 4. Chronic mild wall thickening in the urinary bladder, query chronic cystitis.   Electronically Signed   By: Herbie Baltimore M.D.   On: 06/21/2013 19:58   Scheduled Meds: . enoxaparin (LOVENOX) injection  40 mg Subcutaneous Q24H  . folic acid  1 mg Oral Daily  . LORazepam  0-4 mg Oral Q6H   Followed by  . LORazepam  0-4 mg Oral Q12H  . multivitamin with minerals  1 tablet Oral Daily  . senna-docusate  1 tablet Oral BID  . thiamine  100 mg Oral Daily   Or  . thiamine  100 mg Intravenous Daily   Continuous Infusions: . sodium chloride 0.9 % 1,000 mL with potassium chloride 20 mEq infusion 125 mL/hr at 06/22/13 1648   Principal Problem:   Acute pancreatitis Active Problems:   Alcohol abuse   Nicotine abuse  Time spent: 25  Pamella Pert, MD [facility] Pager [phone]. If 7 PM - 7 AM, please contact night-coverage at [email or web address], password [facility] 06/23/2013, 11:55 AM  LOS: 2 days

## 2013-06-24 DIAGNOSIS — F259 Schizoaffective disorder, unspecified: Secondary | ICD-10-CM

## 2013-06-24 DIAGNOSIS — R440 Auditory hallucinations: Secondary | ICD-10-CM | POA: Diagnosis present

## 2013-06-24 DIAGNOSIS — F1994 Other psychoactive substance use, unspecified with psychoactive substance-induced mood disorder: Secondary | ICD-10-CM

## 2013-06-24 LAB — CBC
HCT: 41.5 % (ref 39.0–52.0)
Hemoglobin: 13.9 g/dL (ref 13.0–17.0)
MCHC: 33.5 g/dL (ref 30.0–36.0)
RBC: 4.78 MIL/uL (ref 4.22–5.81)
WBC: 6.9 10*3/uL (ref 4.0–10.5)

## 2013-06-24 LAB — BASIC METABOLIC PANEL
BUN: 6 mg/dL (ref 6–23)
CO2: 27 mEq/L (ref 19–32)
Chloride: 99 mEq/L (ref 96–112)
Creatinine, Ser: 0.79 mg/dL (ref 0.50–1.35)
GFR calc non Af Amer: >90 mL/min (ref >90)
Glucose, Bld: 94 mg/dL (ref 70–99)
Potassium: 3.7 mEq/L (ref 3.5–5.1)
Sodium: 136 mEq/L (ref 135–145)

## 2013-06-24 MED ORDER — OXYCODONE-ACETAMINOPHEN 5-325 MG PO TABS
1.0000 | ORAL_TABLET | ORAL | Status: DC | PRN
  Administered 2013-06-24 – 2013-06-25 (×3): 1 via ORAL
  Filled 2013-06-24 (×3): qty 1

## 2013-06-24 MED ORDER — HYDROMORPHONE HCL PF 1 MG/ML IJ SOLN
1.0000 mg | Freq: Four times a day (QID) | INTRAMUSCULAR | Status: DC | PRN
  Administered 2013-06-24 – 2013-06-25 (×3): 1 mg via INTRAVENOUS
  Filled 2013-06-24 (×3): qty 1

## 2013-06-24 NOTE — Progress Notes (Signed)
[facility] PROGRESS NOTE  Antonio Alvarez YNW:[id number] DOB: 03-25-1981 DOA: 06/21/2013 PCP: No PCP Per Patient  HPI/Subjective/Interval Events: - patient states that he was diagnosed in the past with paranoia/schizophrenia and was at one point followed at [facility] in 2007-2008. He was on a medication (he cannot recall its name but sounds like Abilify) however self discontinued that few years ago as he did not see the point in continuing. Has more auditory hallucinations for the past 2 months, independent of his ETOH use. Feeld depressed, denies SI or HI. - eating well this morning, still with some abdominal pain  Assessment/Plan:  Acute pancreatitis -Most likely due to alcohol use, third episode in the past few months. Slowly improving, lipase better and eating a fat free diet - counseled for alcohol/tobacco cessation to minimize future flare-ups Auditory and visual hallucinations - for at least few months, history of schizophrenia per patient, not treated currently. - asked for a psychiatry consult today.  Polysubstance abuse  - on CIWA Leukocytosis  - Likely due to pancreatitis, resolved DVT Prophylaxis - Lovenox  Code Status: Full Family Communication: none  Disposition Plan: home when medically ready, likely 10/11 once evaluated by psych.  Consultants:  Psychiatry    Procedures:  none   Objective: Filed Vitals:   06/23/13 0557 06/23/13 1400 06/23/13 2141 06/24/13 0513  BP: 111/70 116/70 102/60 133/83  Pulse: 1 56 96 61  Temp: 97.8 F (36.6 C) 97.9 F (36.6 C)  98 F (36.7 C)  TempSrc: Oral Oral Oral Oral  Resp: 20 16 18 18   Height:      Weight:      SpO2: 99% 100% 96% 96%    Intake/Output Summary (Last 24 hours) at 06/24/13 1138 Last data filed at 06/24/13 1032  Gross per 24 hour  Intake   3245 ml  Output      3 ml  Net   3242 ml   Filed Weights   06/21/13 1808  Weight: 65.681 kg (144 lb 12.8 oz)   Exam:  General:   NAD  Cardiovascular: regular rate and rhythm, without MRG  Respiratory: good air movement, clear to auscultation throughout, no wheezing, ronchi or rales  Abdomen: soft, mild tenderness to palpation throughout  MSK: no peripheral edema  Neuro: non focal  Data Reviewed: Basic Metabolic Panel:  Recent Labs Lab 06/21/13 1330 06/22/13 0548 06/24/13 0535  NA 136 135 136  K 3.8 3.8 3.7  CL 96 99 99  CO2 29 26 27   GLUCOSE 118* 81 94  BUN 11 8 6   CREATININE 0.89 0.90 0.79  CALCIUM 9.4 8.7 9.1  MG  --  1.8  --    Liver Function Tests:  Recent Labs Lab 06/21/13 1330  AST 24  ALT 17  ALKPHOS 123*  BILITOT 0.7  PROT 8.3  ALBUMIN 4.1    Recent Labs Lab 06/21/13 1330 06/24/13 0535  LIPASE 1371* 195*   CBC:  Recent Labs Lab 06/21/13 1330 06/22/13 0548 06/24/13 0535  WBC 13.4* 10.9* 6.9  NEUTROABS 11.3*  --   --   HGB 15.7 14.0 13.9  HCT 45.8 41.8 41.5  MCV 87.1 87.8 86.8  PLT 337 302 347   Cardiac Enzymes:  Recent Labs Lab 06/21/13 1330  TROPONINI <0.30   Studies: No results found. Scheduled Meds: . enoxaparin (LOVENOX) injection  40 mg Subcutaneous Q24H  . folic acid  1 mg Oral Daily  . LORazepam  0-4 mg Oral Q12H  . multivitamin with minerals  1 tablet Oral Daily  . senna-docusate  1 tablet Oral BID  . thiamine  100 mg Oral Daily   Or  . thiamine  100 mg Intravenous Daily   Continuous Infusions: . sodium chloride 0.9 % 1,000 mL with potassium chloride 20 mEq infusion 125 mL/hr at 06/22/13 1648   Principal Problem:   Acute pancreatitis Active Problems:   Alcohol abuse   Nicotine abuse  Time spent: 28  Pamella Pert, MD [facility] Pager [phone]. If 7 PM - 7 AM, please contact night-coverage at [email or web address], password [facility] 06/24/2013, 11:38 AM  LOS: 3 days
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
## 2013-06-24 NOTE — Consult Note (Signed)
[facility] Face-to-Face Psychiatry Consult   Reason for Consult:  Hallucinations Referring Physician:  Dr Antonio Alvarez is an 32 y.o. male.  Assessment: AXIS I:  Schizoaffective Disorder and Substance Induced Mood Disorder AXIS II:  Deferred AXIS III:   Past Medical History  Diagnosis Date  . Abscess     hx of abscess on neck  . Alcohol abuse   . Cellulitis   . Hyponatremia   . Leukocytosis   . Pancreatitis    AXIS IV:  other psychosocial or environmental problems, problems related to social environment and problems with primary support group AXIS V:  51-60 moderate symptoms  Plan:  No evidence of imminent risk to self or others at present.   Patient does not meet criteria for psychiatric inpatient admission. Supportive therapy provided about ongoing stressors. Discussed crisis plan, support from social network, calling 911, coming to the Emergency Department, and calling Suicide Hotline.  Subjective:   Antonio Alvarez is a 32 y.o. male patient admitted with abdominal pain.  HPI:  Patient is 32 year old African American man who was admitted because of abdominal pain.  Patient endorsed history of heavy drinking and using drugs mostly cannabis.  Patient seemed chart reviewed.  The patient is complaining of auditory and visual hallucinations for the past one year.  He remembers he used to take Abilify however he stopped because it did not help him.  Patient denies any suicidal thoughts or homicidal thoughts.  He admitted feeling paranoia and not comfortable around people.  Patient endorsed seeing shadows and hearing people calling his name.  He endorsed getting upset when he turn around and no one is there.  Patient also endorses irritability anger however he denies any aggression or violence.  Patient used to see psychiatrist at [facility] and [facility].  Patient endorses history of one psychiatric hospitalization at [facility] as an involuntary because he was very  aggressive agitated at that time.  Patient endorsed history of heavy drinking, on and off for at least 17 years.  He also endorsed smoking marijuana and occasionally using cocaine.  Patient never treated for his addiction.  Patient is willing to try any medication to help his psychosis.  He also trying to get disability because of physical and mental reason.  He is also interested to get outpatient treatment for his drug addiction. HPI Elements:   Location:  Medical floor. Quality:  fair. Severity:  moderate. Timing:  Ongoing.  Past Psychiatric History: Past Medical History  Diagnosis Date  . Abscess     hx of abscess on neck  . Alcohol abuse   . Cellulitis   . Hyponatremia   . Leukocytosis   . Pancreatitis     reports that he has been smoking Cigarettes.  He has a 1 pack-year smoking history. He uses smokeless tobacco. He reports that he drinks about 21.0 ounces of alcohol per week. He reports that he uses illicit drugs (Marijuana). Family History  Problem Relation Age of Onset  . Alcohol abuse Father      Living Arrangements: Alone   Abuse/Neglect [facility]) Physical Abuse: Denies Verbal Abuse: Denies Sexual Abuse: Denies Allergies:  No Known Allergies  ACT Assessment Complete:  No:   Past Psychiatric History: Diagnosis:  Schizoaffective disorder, polysubstance abuse   Hospitalizations:  [facility]  Outpatient Care:  Currently none   Substance Abuse Care:  History of drinking and smoking marijuana   Self-Mutilation:  Denies   Suicidal Attempts:  Denies   Homicidal  Behaviors:  Denies    Violent Behaviors:  Denies    Place of Residence:  Lives with his friend Marital Status:  Single Employed/Unemployed:  Employed at [employer] Education:  High school Family Supports:  Mother lives in [place] and father lives in [place] Objective: Blood pressure 118/79, pulse 61, temperature 97.4 F (36.3 C), temperature source Oral, resp. rate 20, height 5\' 11"  (1.803 m), weight 144 lb 12.8  oz (65.681 kg), SpO2 97.00%.Body mass index is 20.2 kg/(m^2). Results for orders placed during the hospital encounter of 06/21/13 (from the past 72 hour(s))  URINE RAPID DRUG SCREEN (HOSP PERFORMED)     Status: Abnormal   Collection Time    06/21/13  7:25 PM      Result Value Range   Opiates POSITIVE (*) NONE DETECTED   Cocaine NONE DETECTED  NONE DETECTED   Benzodiazepines NONE DETECTED  NONE DETECTED   Amphetamines NONE DETECTED  NONE DETECTED   Tetrahydrocannabinol POSITIVE (*) NONE DETECTED   Barbiturates NONE DETECTED  NONE DETECTED   Comment:            DRUG SCREEN FOR MEDICAL PURPOSES     ONLY.  IF CONFIRMATION IS NEEDED     FOR ANY PURPOSE, NOTIFY LAB     WITHIN 5 DAYS.                LOWEST DETECTABLE LIMITS     FOR URINE DRUG SCREEN     Drug Class       Cutoff (ng/mL)     Amphetamine      1000     Barbiturate      200     Benzodiazepine   200     Tricyclics       300     Opiates          300     Cocaine          300     THC              50  URINALYSIS W MICROSCOPIC + REFLEX CULTURE     Status: Abnormal   Collection Time    06/21/13  7:25 PM      Result Value Range   Color, Urine AMBER (*) YELLOW   Comment: BIOCHEMICALS MAY BE AFFECTED BY COLOR   APPearance CLEAR  CLEAR   Specific Gravity, Urine 1.029  1.005 - 1.030   pH 6.0  5.0 - 8.0   Glucose, UA NEGATIVE  NEGATIVE mg/dL   Hgb urine dipstick NEGATIVE  NEGATIVE   Bilirubin Urine SMALL (*) NEGATIVE   Ketones, ur 15 (*) NEGATIVE mg/dL   Protein, ur 30 (*) NEGATIVE mg/dL   Urobilinogen, UA 0.2  0.0 - 1.0 mg/dL   Nitrite NEGATIVE  NEGATIVE   Leukocytes, UA NEGATIVE  NEGATIVE   WBC, UA 3-6  <3 WBC/hpf   RBC / HPF 0-2  <3 RBC/hpf   Squamous Epithelial / LPF RARE  RARE  URINE CULTURE     Status: None   Collection Time    06/21/13  7:25 PM      Result Value Range   Specimen Description URINE, RANDOM     Special Requests NONE     Culture  Setup Time       Value: 06/22/2013 02:53     Performed at [facility]       Value: NO GROWTH     Performed at  [facility]   Culture       Value: NO GROWTH     Performed at [facility]   Report Status 06/23/2013 FINAL    BASIC METABOLIC PANEL     Status: None   Collection Time    06/22/13  5:48 AM      Result Value Range   Sodium 135  135 - 145 mEq/L   Potassium 3.8  3.5 - 5.1 mEq/L   Chloride 99  96 - 112 mEq/L   CO2 26  19 - 32 mEq/L   Glucose, Bld 81  70 - 99 mg/dL   BUN 8  6 - 23 mg/dL   Creatinine, Ser 1.61  0.50 - 1.35 mg/dL   Calcium 8.7  8.4 - 09.6 mg/dL   GFR calc non Af Amer >90  >90 mL/min   GFR calc Af Amer >90  >90 mL/min   Comment: (NOTE)     The eGFR has been calculated using the CKD EPI equation.     This calculation has not been validated in all clinical situations.     eGFR's persistently <90 mL/min signify possible Chronic Kidney     Disease.  CBC     Status: Abnormal   Collection Time    06/22/13  5:48 AM      Result Value Range   WBC 10.9 (*) 4.0 - 10.5 K/uL   RBC 4.76  4.22 - 5.81 MIL/uL   Hemoglobin 14.0  13.0 - 17.0 g/dL   HCT 04.5  40.9 - 81.1 %   MCV 87.8  78.0 - 100.0 fL   MCH 29.4  26.0 - 34.0 pg   MCHC 33.5  30.0 - 36.0 g/dL   RDW 91.4  78.2 - 95.6 %   Platelets 302  150 - 400 K/uL  LIPID PANEL     Status: Abnormal   Collection Time    06/22/13  5:48 AM      Result Value Range   Cholesterol 149  0 - 200 mg/dL   Triglycerides 213 (*) <150 mg/dL   HDL 46  >08 mg/dL   Total CHOL/HDL Ratio 3.2     VLDL 34  0 - 40 mg/dL   LDL Cholesterol 69  0 - 99 mg/dL   Comment:            Total Cholesterol/HDL:CHD Risk     Coronary Heart Disease Risk Table                         Men   Women      1/2 Average Risk   3.4   3.3      Average Risk       5.0   4.4      2 X Average Risk   9.6   7.1      3 X Average Risk  23.4   11.0                Use the calculated Patient Ratio     above and the CHD Risk Table     to determine the patient's CHD Risk.                ATP III  CLASSIFICATION (LDL):      <100     mg/dL   Optimal      657-846  mg/dL   Near or Above  Optimal      130-159  mg/dL   Borderline      409-811  mg/dL   High      >914     mg/dL   Very High     Performed at [facility]  MAGNESIUM     Status: None   Collection Time    06/22/13  5:48 AM      Result Value Range   Magnesium 1.8  1.5 - 2.5 mg/dL  CBC     Status: None   Collection Time    06/24/13  5:35 AM      Result Value Range   WBC 6.9  4.0 - 10.5 K/uL   RBC 4.78  4.22 - 5.81 MIL/uL   Hemoglobin 13.9  13.0 - 17.0 g/dL   HCT 78.2  95.6 - 21.3 %   MCV 86.8  78.0 - 100.0 fL   MCH 29.1  26.0 - 34.0 pg   MCHC 33.5  30.0 - 36.0 g/dL   RDW 08.6  57.8 - 46.9 %   Platelets 347  150 - 400 K/uL  BASIC METABOLIC PANEL     Status: None   Collection Time    06/24/13  5:35 AM      Result Value Range   Sodium 136  135 - 145 mEq/L   Potassium 3.7  3.5 - 5.1 mEq/L   Chloride 99  96 - 112 mEq/L   CO2 27  19 - 32 mEq/L   Glucose, Bld 94  70 - 99 mg/dL   BUN 6  6 - 23 mg/dL   Creatinine, Ser 6.29  0.50 - 1.35 mg/dL   Calcium 9.1  8.4 - 52.8 mg/dL   GFR calc non Af Amer >90  >90 mL/min   GFR calc Af Amer >90  >90 mL/min   Comment: (NOTE)     The eGFR has been calculated using the CKD EPI equation.     This calculation has not been validated in all clinical situations.     eGFR's persistently <90 mL/min signify possible Chronic Kidney     Disease.  LIPASE, BLOOD     Status: Abnormal   Collection Time    06/24/13  5:35 AM      Result Value Range   Lipase 195 (*) 11 - 59 U/L   Labs are reviewed and are pertinent for high lipase.  Current Facility-Administered Medications  Medication Dose Route Frequency Provider Last Rate Last Dose  . acetaminophen (TYLENOL) tablet 650 mg  650 mg Oral Q6H PRN Catarina Hartshorn, MD   650 mg at 06/24/13 4132   Or  . acetaminophen (TYLENOL) suppository 650 mg  650 mg Rectal Q6H PRN Catarina Hartshorn, MD      . enoxaparin (LOVENOX) injection  40 mg  40 mg Subcutaneous Q24H Catarina Hartshorn, MD   40 mg at 06/23/13 2048  . folic acid (FOLVITE) tablet 1 mg  1 mg Oral Daily Nicole Pisciotta, PA-C   1 mg at 06/24/13 1015  . HYDROmorphone (DILAUDID) injection 1 mg  1 mg Intravenous Q6H PRN Pamella Pert, MD   1 mg at 06/24/13 1501  . LORazepam (ATIVAN) tablet 0-4 mg  0-4 mg Oral Q12H Nicole Pisciotta, PA-C      . multivitamin with minerals tablet 1 tablet  1 tablet Oral Daily Nicole Pisciotta, PA-C   1 tablet at 06/24/13 1015  . ondansetron (ZOFRAN) tablet 4 mg  4 mg Oral Q6H PRN Catarina Hartshorn, MD  Or  . ondansetron (ZOFRAN) injection 4 mg  4 mg Intravenous Q6H PRN Catarina Hartshorn, MD      . oxyCODONE-acetaminophen (PERCOCET/ROXICET) 5-325 MG per tablet 1 tablet  1 tablet Oral Q3H PRN Pamella Pert, MD   1 tablet at 06/24/13 1708  . senna-docusate (Senokot-S) tablet 1 tablet  1 tablet Oral BID Pamella Pert, MD   1 tablet at 06/24/13 1016  . sodium chloride 0.9 % 1,000 mL with potassium chloride 20 mEq infusion   Intravenous Continuous Catarina Hartshorn, MD 125 mL/hr at 06/24/13 1331    . thiamine (VITAMIN B-1) tablet 100 mg  100 mg Oral Daily Nicole Pisciotta, PA-C   100 mg at 06/24/13 1015   Or  . thiamine (B-1) injection 100 mg  100 mg Intravenous Daily Nicole Pisciotta, PA-C   100 mg at 06/22/13 0947  . traZODone (DESYREL) tablet 50 mg  50 mg Oral QHS PRN Pamella Pert, MD   50 mg at 06/22/13 2059    Psychiatric Specialty Exam:     Blood pressure 118/79, pulse 61, temperature 97.4 F (36.3 C), temperature source Oral, resp. rate 20, height 5\' 11"  (1.803 m), weight 144 lb 12.8 oz (65.681 kg), SpO2 97.00%.Body mass index is 20.2 kg/(m^2).  General Appearance: Casual  Eye Contact::  Fair  Speech:  Normal Rate  Volume:  Normal  Mood:  Anxious  Affect:  Appropriate  Thought Process:  Coherent and Logical  Orientation:  Full (Time, Place, and Person)  Thought Content:  Hallucinations: Auditory Visual  Suicidal Thoughts:  No  Homicidal Thoughts:   No  Memory:  alert times three  Judgement:  Good  Insight:  Good  Psychomotor Activity:  Normal  Concentration:  Fair  Recall:  Fair  Akathisia:  No  Handed:  Right  AIMS (if indicated):     Assets:  Desire for Improvement Housing Social Support  Sleep:      Treatment Plan Summary: Medication management Start Haldol 2 mg at bedtime for psychosis and paranoia.  Patient does not require inpatient psychiatric treatment however for outpatient services for medication management counseling.  Social worker to arrange outpatient appointment at [facility] upon discharge.  Please call [phone] if you have any further questions   Kingstyn Deruiter T. 06/24/2013 5:11 PM

## 2013-06-25 MED ORDER — ONDANSETRON HCL 4 MG PO TABS: 4.0000 mg | ORAL_TABLET | Freq: Four times a day (QID) | ORAL | Status: DC | PRN

## 2013-06-25 MED ORDER — TRAZODONE HCL 50 MG PO TABS: 50.0000 mg | ORAL_TABLET | Freq: Every evening | ORAL | Status: DC | PRN

## 2013-06-25 MED ORDER — ADULT MULTIVITAMIN W/MINERALS CH: 1.0000 | ORAL_TABLET | Freq: Every day | ORAL | Status: DC

## 2013-06-25 MED ORDER — OXYCODONE-ACETAMINOPHEN 5-325 MG PO TABS: 1.0000 | ORAL_TABLET | Freq: Three times a day (TID) | ORAL | Status: DC | PRN

## 2013-06-25 MED ORDER — HALOPERIDOL 2 MG PO TABS: 2.0000 mg | ORAL_TABLET | Freq: Every day | ORAL | Status: DC

## 2013-06-25 NOTE — Discharge Summary (Signed)
Physician Discharge Summary  Antonio Alvarez ZOX:[id number] DOB: Dec 05, 1980 DOA: 06/21/2013  PCP: No PCP Per Patient  Admit date: 06/21/2013 Discharge date: 06/25/2013  Time spent: 35 minutes  Recommendations for Outpatient Follow-up:  1. Follow up with PCP in 1-2 weeks 2. Follow up with [facility] services    Recommendations for primary care physician for things to follow:  Continuing abstinence  Discharge Diagnoses:  Principal Problem:   Acute pancreatitis Active Problems:   Alcohol abuse   Nicotine abuse   Auditory hallucinations  Discharge Condition: stable  Diet recommendation: regular  Filed Weights   06/21/13 1808  Weight: 65.681 kg (144 lb 12.8 oz)   History of present illness:  32 year old male with a history of alcohol dependence, pancreatitis presents with one-day history of abdominal pain and intractable nausea the patient states that and vomiting that began on the day of admission. the patient states that his last drink was approximately 48 hours prior to admission. On 06/19/2013, the patient states that he drank approximately 5 x 40 ounce beers. He states that he drinks beers to help him go to sleep. Normally, the patient states that he drinks approximately 3 beers everyday for the past 17 years. He also endorses use of cannabis. He denies any other illegal drug use. He denies any over-the-counter supplements or over-the-counter medications. He takes no prescription meds. He denies any headache, dizziness, syncope, chest pain, shortness of breath, palpitations, dysuria, hematuria, hematochezia, melena, hematemesis, or diarrhea. He smokes approximately one pack per day x17 years. The patient was discharged from the hospital on 05/09/2013 after an episode of alcoholic pancreatitis. Abdominal ultrasound on 05/07/2013 showed fatty liver, normal gallbladder, moderate pancreatic head enlargement. In emergency department, the patient was found to have WBC 13.4, unremarkable  BMP, lipase 1371 with normal LFTs. Alcohol level was negative. The patient was given 3 mg of Dilaudid and started on IV fluids.   Hospital Course:  Acute pancreatitis -Most likely due to alcohol use, third episode in the past few months. Patient was initially NPO and his diet was slowly advance to fat free diet. He was tolerating it well without nausea/vomiting. His abdominal pain has improved and was no longer exacerbated by eating. Patient was extensively counseled that if he continues to drink alcohol he is likely to have another flare-up and high chance of developing chronic pancreatitis. ETOH abuse - patient willing to stop drinking, appreciate SW assistance with outpatient resources.  Auditory and visual hallucinations - for at least few months, history of schizophrenia per patient, not treated currently. Psychiatry was consulted inpatient and recommended starting low dose haldol for psychosis and paranoia; no need for inpatient psychiatric hospitalization, and outpatient resources have been provided to patient.  Polysubstance abuse - on CIWA, patient seemed not to withdraw inpatient. Leukocytosis - Likely due to pancreatitis, resolved   Procedures:  none   Consultations:  Psychiatry  Discharge Exam: Filed Vitals:   06/24/13 0513 06/24/13 1316 06/24/13 2210 06/25/13 0650  BP: 133/83 118/79 126/85 112/71  Pulse: 61 61 58 56  Temp: 98 F (36.7 C) 97.4 F (36.3 C) 98.4 F (36.9 C) 97.8 F (36.6 C)  TempSrc: Oral Oral Oral Oral  Resp: 18 20 18 18   Height:      Weight:      SpO2: 96% 97% 96% 95%   General: NAD Cardiovascular: RRR Respiratory: CTA biL  Discharge Instructions    Medication List         haloperidol 2 MG tablet  Commonly  known as:  HALDOL  Take 1 tablet (2 mg total) by mouth at bedtime.     multivitamin with minerals Tabs tablet  Take 1 tablet by mouth daily.     ondansetron 4 MG tablet  Commonly known as:  ZOFRAN  Take 1 tablet (4 mg total) by mouth  every 6 (six) hours as needed for nausea.     oxyCODONE-acetaminophen 5-325 MG per tablet  Commonly known as:  PERCOCET/ROXICET  Take 1 tablet by mouth every 8 (eight) hours as needed.     traZODone 50 MG tablet  Commonly known as:  DESYREL  Take 1 tablet (50 mg total) by mouth at bedtime as needed for sleep.         The results of significant diagnostics from this hospitalization (including imaging, microbiology, ancillary and laboratory) are listed below for reference.    Significant Diagnostic Studies: Dg Chest 2 View  06/21/2013   CLINICAL DATA:  Abdominal pain with vomiting and nausea.  EXAM: CHEST  2 VIEW  COMPARISON:  Chest x-ray dated 08/14/2008  FINDINGS: The heart size and mediastinal contours are within normal limits. Both lungs are clear. The visualized skeletal structures are unremarkable.  IMPRESSION: No active cardiopulmonary disease.   Electronically Signed   By: Geanie Cooley M.D.   On: 06/21/2013 14:54   Ct Abdomen Pelvis W Contrast  06/21/2013   CLINICAL DATA:  Abdominal pain. Nausea.  History pancreatitis.  EXAM: CT ABDOMEN AND PELVIS WITH CONTRAST  TECHNIQUE: Multidetector CT imaging of the abdomen and pelvis was performed using the standard protocol following bolus administration of intravenous contrast.  CONTRAST:  80mL OMNIPAQUE IOHEXOL 300 MG/ML  SOLN  COMPARISON:  02/15/2013  FINDINGS: Hepatic steatosis. 5 mm hypodense lesion in the dome of the right hepatic lobe, image 8 of series 2.  Peripancreatic stranding noted, particularly in the vicinity of the pancreatic head. Small amount of stranding tracks along the porta hepatis and spleen.  Left kidney lower pole cyst appears stable. Gallbladder unremarkable. Adrenal glands normal.  No pseudocyst or pancreatic necrosis.  Appendix normal.  No dilated bowel.  Urinary bladder mild wall thickening is observed but appears to be chronic. Pelvic ascites noted.  IMPRESSION: 1. Acute pancreatitis without findings of pancreatic  pseudocysts, pancreatic necrosis, or abscess. 2. Diffuse hepatic steatosis. A small hypodense lesion in the dome of the right hepatic lobe is stable and probably a small cyst. 3. Pelvic ascites. 4. Chronic mild wall thickening in the urinary bladder, query chronic cystitis.   Electronically Signed   By: Herbie Baltimore M.D.   On: 06/21/2013 19:58   Microbiology: Recent Results (from the past 240 hour(s))  URINE CULTURE     Status: None   Collection Time    06/21/13  7:25 PM      Result Value Range Status   Specimen Description URINE, RANDOM   Final   Special Requests NONE   Final   Culture  Setup Time     Final   Value: 06/22/2013 02:53     Performed at [facility] Count     Final   Value: NO GROWTH     Performed at [facility]   Culture     Final   Value: NO GROWTH     Performed at [facility]   Report Status 06/23/2013 FINAL   Final    Labs: Basic Metabolic Panel:  Recent Labs Lab 06/21/13 1330 06/22/13 0548 06/24/13 0535  NA 136  135 136  K 3.8 3.8 3.7  CL 96 99 99  CO2 29 26 27   GLUCOSE 118* 81 94  BUN 11 8 6   CREATININE 0.89 0.90 0.79  CALCIUM 9.4 8.7 9.1  MG  --  1.8  --    Liver Function Tests:  Recent Labs Lab 06/21/13 1330  AST 24  ALT 17  ALKPHOS 123*  BILITOT 0.7  PROT 8.3  ALBUMIN 4.1    Recent Labs Lab 06/21/13 1330 06/24/13 0535  LIPASE 1371* 195*   CBC:  Recent Labs Lab 06/21/13 1330 06/22/13 0548 06/24/13 0535  WBC 13.4* 10.9* 6.9  NEUTROABS 11.3*  --   --   HGB 15.7 14.0 13.9  HCT 45.8 41.8 41.5  MCV 87.1 87.8 86.8  PLT 337 302 347   Cardiac Enzymes:  Recent Labs Lab 06/21/13 1330  TROPONINI <0.30    Signed:  Pamella Pert  [facility] 06/25/2013, 12:26 PM

## 2013-06-25 NOTE — Progress Notes (Signed)
CSW received a call to assist the Pt with a bus pass. Pt also needed a appt set up with [facility], however CSW contacted [facility] and they can not make an appt or take a referral on the weekend. [facility] stated that Pt would need to come in on Monday at the walk-in clinic and enroll back/or intiially in [place].   CSW provided information to Pt.  No Further needs at this time.   Lenord Carbo [facility] [phone]

## 2013-09-07 ENCOUNTER — Emergency Department (HOSPITAL_COMMUNITY)
Admission: EM | Admit: 2013-09-07 | Discharge: 2013-09-08 | Disposition: A | Discharge status: Other Acute Inpatient Hospital | Payer: Self-pay | Attending: Emergency Medicine | Admitting: Emergency Medicine

## 2013-09-07 ENCOUNTER — Encounter (HOSPITAL_COMMUNITY): Payer: Self-pay | Admitting: Emergency Medicine

## 2013-09-07 DIAGNOSIS — R45851 Suicidal ideations: Secondary | ICD-10-CM | POA: Insufficient documentation

## 2013-09-07 DIAGNOSIS — R109 Unspecified abdominal pain: Secondary | ICD-10-CM | POA: Insufficient documentation

## 2013-09-07 DIAGNOSIS — Z862 Personal history of diseases of the blood and blood-forming organs and certain disorders involving the immune mechanism: Secondary | ICD-10-CM | POA: Insufficient documentation

## 2013-09-07 DIAGNOSIS — Z872 Personal history of diseases of the skin and subcutaneous tissue: Secondary | ICD-10-CM | POA: Insufficient documentation

## 2013-09-07 DIAGNOSIS — F1010 Alcohol abuse, uncomplicated: Secondary | ICD-10-CM | POA: Insufficient documentation

## 2013-09-07 DIAGNOSIS — F17200 Nicotine dependence, unspecified, uncomplicated: Secondary | ICD-10-CM | POA: Insufficient documentation

## 2013-09-07 DIAGNOSIS — Z8639 Personal history of other endocrine, nutritional and metabolic disease: Secondary | ICD-10-CM | POA: Insufficient documentation

## 2013-09-07 DIAGNOSIS — K861 Other chronic pancreatitis: Secondary | ICD-10-CM | POA: Insufficient documentation

## 2013-09-07 HISTORY — DX: Major depressive disorder, single episode, unspecified: F32.9

## 2013-09-07 HISTORY — DX: Depression, unspecified: F32.A

## 2013-09-07 LAB — CBC WITH DIFFERENTIAL/PLATELET
Basophils Absolute: 0 10*3/uL (ref 0.0–0.1)
Eosinophils Absolute: 0 10*3/uL (ref 0.0–0.7)
Eosinophils Relative: 0 % (ref 0–5)
HCT: 43.7 % (ref 39.0–52.0)
Hemoglobin: 15 g/dL (ref 13.0–17.0)
Lymphocytes Relative: 21 % (ref 12–46)
Lymphs Abs: 2 10*3/uL (ref 0.7–4.0)
MCH: 29.6 pg (ref 26.0–34.0)
MCV: 86.2 fL (ref 78.0–100.0)
Monocytes Relative: 7 % (ref 3–12)
Platelets: 269 10*3/uL (ref 150–400)
RBC: 5.07 MIL/uL (ref 4.22–5.81)
RDW: 14.2 % (ref 11.5–15.5)
WBC: 9.3 10*3/uL (ref 4.0–10.5)

## 2013-09-07 LAB — COMPREHENSIVE METABOLIC PANEL
ALT: 20 U/L (ref 0–53)
Alkaline Phosphatase: 132 U/L — ABNORMAL HIGH (ref 39–117)
BUN: 12 mg/dL (ref 6–23)
CO2: 28 mEq/L (ref 19–32)
Calcium: 9.1 mg/dL (ref 8.4–10.5)
GFR calc Af Amer: >90 mL/min (ref >90)
GFR calc non Af Amer: >90 mL/min (ref >90)
Glucose, Bld: 89 mg/dL (ref 70–99)
Sodium: 141 mEq/L (ref 135–145)
Total Protein: 8.2 g/dL (ref 6.0–8.3)

## 2013-09-07 LAB — RAPID URINE DRUG SCREEN, HOSP PERFORMED
Barbiturates: NOT DETECTED
Benzodiazepines: NOT DETECTED
Tetrahydrocannabinol: POSITIVE — AB

## 2013-09-07 LAB — LIPASE, BLOOD: Lipase: 166 U/L — ABNORMAL HIGH (ref 11–59)

## 2013-09-07 NOTE — ED Provider Notes (Signed)
CSN: [id number]     Arrival date & time 09/07/13  1955 History   First MD Initiated Contact with Patient 09/07/13 2150     Chief Complaint  Patient presents with  . Medical Clearance   (Consider location/radiation/quality/duration/timing/severity/associated sxs/prior Treatment) HPI Comments: This is a 32 year old male, who is requesting detox from alcohol as he "needs to get himself Straight"  As he's girlfriend is pregnant. He, states he is suicidal, but has no plan   The history is provided by the patient.    Past Medical History  Diagnosis Date  . Abscess     hx of abscess on neck  . Alcohol abuse   . Cellulitis   . Hyponatremia   . Leukocytosis   . Pancreatitis   . Depression    Past Surgical History  Procedure Laterality Date  . Abscess removed from neck     Family History  Problem Relation Age of Onset  . Alcohol abuse Father    History  Substance Use Topics  . Smoking status: Current Every Day Smoker -- 1.00 packs/day for 1 years    Types: Cigarettes  . Smokeless tobacco: Current User  . Alcohol Use: 21.0 oz/week    35 Cans of beer per week    Review of Systems  Constitutional: Negative for fever.  Psychiatric/Behavioral: Positive for suicidal ideas.  All other systems reviewed and are negative.    Allergies  Review of patient's allergies indicates no known allergies.  Home Medications  No current outpatient prescriptions on file. BP 135/70  Pulse 71  Temp(Src) 99 F (37.2 C) (Oral)  Resp 18  Ht 5\' 11"  (1.803 m)  Wt 135 lb (61.236 kg)  BMI 18.84 kg/m2  SpO2 97% Physical Exam  Nursing note and vitals reviewed. Constitutional: He appears well-developed and well-nourished.  HENT:  Head: Normocephalic.  Neck: Normal range of motion.  Cardiovascular: Normal rate.   Pulmonary/Chest: He is in respiratory distress.  Neurological: He is alert.  Skin: Skin is warm.  Psychiatric: He has a normal mood and affect. His speech is normal and behavior  is normal. He expresses suicidal ideation. He expresses no suicidal plans.    ED Course  Procedures (including critical care time) Labs Review Labs Reviewed  COMPREHENSIVE METABOLIC PANEL - Abnormal; Notable for the following:    Alkaline Phosphatase 132 (*)    All other components within normal limits  LIPASE, BLOOD - Abnormal; Notable for the following:    Lipase 166 (*)    All other components within normal limits  URINE RAPID DRUG SCREEN (HOSP PERFORMED) - Abnormal; Notable for the following:    Cocaine POSITIVE (*)    Tetrahydrocannabinol POSITIVE (*)    All other components within normal limits  CBC WITH DIFFERENTIAL  ETHANOL   Imaging Review No results found.  EKG Interpretation   None       MDM   1. Pancreatitis, chronic   2. Alcohol abuse   3. Suicidal ideations    Patient has been accepted for transfer to behavioral health by Dr. Dub Mikes Patient is medically cleared for TTS assessment     Arman Filter, NP 09/08/13 0421  Arman Filter, NP 09/08/13 4098

## 2013-09-07 NOTE — ED Notes (Signed)
Pt placed in blue scrubs and security called to wand pt.

## 2013-09-07 NOTE — ED Notes (Signed)
Matt RN called to inform pt can go to camera room. AC notified no sitter available.

## 2013-09-07 NOTE — ED Notes (Signed)
Pt st's he has been feeling suicidal for about 2 weeks.  Pt st's he is homeless.  Pt alert and oriented x's 3.  Pt cooperative

## 2013-09-08 ENCOUNTER — Encounter (HOSPITAL_COMMUNITY): Payer: Self-pay | Admitting: *Deleted

## 2013-09-08 ENCOUNTER — Encounter (HOSPITAL_COMMUNITY): Payer: Self-pay | Admitting: General Practice

## 2013-09-08 ENCOUNTER — Inpatient Hospital Stay (HOSPITAL_COMMUNITY)
Admission: EM | Admit: 2013-09-08 | Discharge: 2013-09-12 | DRG: 885 | Disposition: A | Discharge status: Home / Self Care | Payer: Federal, State, Local not specified - Other | Source: Intra-hospital | Attending: Psychiatry | Admitting: Psychiatry

## 2013-09-08 DIAGNOSIS — D72829 Elevated white blood cell count, unspecified: Secondary | ICD-10-CM

## 2013-09-08 DIAGNOSIS — F200 Paranoid schizophrenia: Secondary | ICD-10-CM

## 2013-09-08 DIAGNOSIS — F1020 Alcohol dependence, uncomplicated: Secondary | ICD-10-CM | POA: Diagnosis present

## 2013-09-08 DIAGNOSIS — R45850 Homicidal ideations: Secondary | ICD-10-CM

## 2013-09-08 DIAGNOSIS — F1410 Cocaine abuse, uncomplicated: Secondary | ICD-10-CM

## 2013-09-08 DIAGNOSIS — F1420 Cocaine dependence, uncomplicated: Secondary | ICD-10-CM

## 2013-09-08 DIAGNOSIS — E860 Dehydration: Secondary | ICD-10-CM

## 2013-09-08 DIAGNOSIS — R440 Auditory hallucinations: Secondary | ICD-10-CM

## 2013-09-08 DIAGNOSIS — F1010 Alcohol abuse, uncomplicated: Secondary | ICD-10-CM

## 2013-09-08 DIAGNOSIS — Z5987 Material hardship due to limited financial resources, not elsewhere classified: Secondary | ICD-10-CM

## 2013-09-08 DIAGNOSIS — Z720 Tobacco use: Secondary | ICD-10-CM

## 2013-09-08 DIAGNOSIS — Z598 Other problems related to housing and economic circumstances: Secondary | ICD-10-CM

## 2013-09-08 DIAGNOSIS — K859 Acute pancreatitis without necrosis or infection, unspecified: Secondary | ICD-10-CM

## 2013-09-08 DIAGNOSIS — L03115 Cellulitis of right lower limb: Secondary | ICD-10-CM

## 2013-09-08 DIAGNOSIS — F332 Major depressive disorder, recurrent severe without psychotic features: Principal | ICD-10-CM | POA: Diagnosis present

## 2013-09-08 DIAGNOSIS — F411 Generalized anxiety disorder: Secondary | ICD-10-CM | POA: Diagnosis present

## 2013-09-08 DIAGNOSIS — F17200 Nicotine dependence, unspecified, uncomplicated: Secondary | ICD-10-CM | POA: Diagnosis present

## 2013-09-08 DIAGNOSIS — R45851 Suicidal ideations: Secondary | ICD-10-CM

## 2013-09-08 DIAGNOSIS — F1220 Cannabis dependence, uncomplicated: Secondary | ICD-10-CM

## 2013-09-08 DIAGNOSIS — E871 Hypo-osmolality and hyponatremia: Secondary | ICD-10-CM

## 2013-09-08 DIAGNOSIS — F1994 Other psychoactive substance use, unspecified with psychoactive substance-induced mood disorder: Secondary | ICD-10-CM

## 2013-09-08 MED ORDER — CHLORDIAZEPOXIDE HCL 25 MG PO CAPS
25.0000 mg | ORAL_CAPSULE | ORAL | Status: AC
  Administered 2013-09-10 (×2): 25 mg via ORAL
  Filled 2013-09-08 (×2): qty 1

## 2013-09-08 MED ORDER — CHLORDIAZEPOXIDE HCL 25 MG PO CAPS: 25.0000 mg | ORAL_CAPSULE | Freq: Four times a day (QID) | ORAL | Status: AC | PRN

## 2013-09-08 MED ORDER — CHLORDIAZEPOXIDE HCL 25 MG PO CAPS
25.0000 mg | ORAL_CAPSULE | Freq: Three times a day (TID) | ORAL | Status: AC
  Administered 2013-09-09 (×2): 25 mg via ORAL
  Filled 2013-09-08 (×2): qty 1

## 2013-09-08 MED ORDER — HYDROXYZINE HCL 25 MG PO TABS: 25.0000 mg | ORAL_TABLET | Freq: Four times a day (QID) | ORAL | Status: AC | PRN

## 2013-09-08 MED ORDER — MAGNESIUM HYDROXIDE 400 MG/5ML PO SUSP: 30.0000 mL | Freq: Every day | ORAL | Status: DC | PRN

## 2013-09-08 MED ORDER — CHLORDIAZEPOXIDE HCL 25 MG PO CAPS
25.0000 mg | ORAL_CAPSULE | Freq: Four times a day (QID) | ORAL | Status: AC
  Filled 2013-09-08 (×2): qty 1

## 2013-09-08 MED ORDER — TRAZODONE HCL 50 MG PO TABS
50.0000 mg | ORAL_TABLET | Freq: Every evening | ORAL | Status: DC | PRN
  Administered 2013-09-08 – 2013-09-10 (×3): 50 mg via ORAL
  Filled 2013-09-08 (×12): qty 1

## 2013-09-08 MED ORDER — HALOPERIDOL 2 MG PO TABS
2.0000 mg | ORAL_TABLET | Freq: Every day | ORAL | Status: DC
  Administered 2013-09-08 – 2013-09-11 (×4): 2 mg via ORAL
  Filled 2013-09-08 (×4): qty 1
  Filled 2013-09-08: qty 14
  Filled 2013-09-08 (×2): qty 1

## 2013-09-08 MED ORDER — ADULT MULTIVITAMIN W/MINERALS CH
1.0000 | ORAL_TABLET | Freq: Every day | ORAL | Status: DC
  Administered 2013-09-09 – 2013-09-12 (×4): 1 via ORAL
  Filled 2013-09-08 (×9): qty 1

## 2013-09-08 MED ORDER — LOPERAMIDE HCL 2 MG PO CAPS: 2.0000 mg | ORAL_CAPSULE | ORAL | Status: AC | PRN

## 2013-09-08 MED ORDER — HALOPERIDOL 0.5 MG PO TABS
0.5000 mg | ORAL_TABLET | Freq: Two times a day (BID) | ORAL | Status: DC | PRN
  Filled 2013-09-08: qty 28

## 2013-09-08 MED ORDER — CHLORDIAZEPOXIDE HCL 25 MG PO CAPS
25.0000 mg | ORAL_CAPSULE | Freq: Every day | ORAL | Status: AC
  Administered 2013-09-11: 25 mg via ORAL
  Filled 2013-09-08: qty 1

## 2013-09-08 MED ORDER — ACETAMINOPHEN 325 MG PO TABS: 650.0000 mg | ORAL_TABLET | Freq: Four times a day (QID) | ORAL | Status: DC | PRN

## 2013-09-08 MED ORDER — ALUM & MAG HYDROXIDE-SIMETH 200-200-20 MG/5ML PO SUSP: 30.0000 mL | ORAL | Status: DC | PRN

## 2013-09-08 MED ORDER — BENZTROPINE MESYLATE 1 MG PO TABS
1.0000 mg | ORAL_TABLET | Freq: Every day | ORAL | Status: DC
  Administered 2013-09-08 – 2013-09-12 (×5): 1 mg via ORAL
  Filled 2013-09-08 (×7): qty 1
  Filled 2013-09-08: qty 14
  Filled 2013-09-08: qty 1

## 2013-09-08 MED ORDER — VITAMIN B-1 100 MG PO TABS
100.0000 mg | ORAL_TABLET | Freq: Every day | ORAL | Status: DC
  Administered 2013-09-09 – 2013-09-12 (×4): 100 mg via ORAL
  Filled 2013-09-08 (×7): qty 1

## 2013-09-08 MED ORDER — ONDANSETRON 4 MG PO TBDP: 4.0000 mg | ORAL_TABLET | Freq: Four times a day (QID) | ORAL | Status: AC | PRN

## 2013-09-08 MED ORDER — THIAMINE HCL 100 MG/ML IJ SOLN
100.0000 mg | Freq: Once | INTRAMUSCULAR | Status: AC
  Administered 2013-09-08: 100 mg via INTRAMUSCULAR
  Filled 2013-09-08: qty 2

## 2013-09-08 NOTE — ED Provider Notes (Signed)
32 year old male with history of alcohol and drug abuse comes in for detox. He states that he needs to help with stopping drinking he needs to stop because when he is drinking, it mixes pancreas hurt. He does admit to some depression and very vague suicidal thoughts but no active suicidal thoughts. On exam, lungs are clear and heart has regular rate and rhythm. Consultation will be obtained with TTS to try to arrange appropriate disposition.  Medical screening examination/treatment/procedure(s) were conducted as a shared visit with non-physician practitioner(s) and myself.  I personally evaluated the patient during the encounter.     Dione Booze, MD 09/08/13 0200

## 2013-09-08 NOTE — Progress Notes (Addendum)
  D:  Pt +ve Passive SI-contracts for safety Pt denies HI/VH. Pt is pleasant and cooperative. Pt tired and sleeping most of the day. Pt +ve AH no command.    A: Pt was offered support and encouragement. Pt was given scheduled medications. Pt was encourage to attend groups. Q 15 minute checks were done for safety.   R:Pt attends groups and interacts well with peers and staff. Pt is taking medication. Pt has no complaints at this time.Pt receptive to treatment and safety maintained on unit.
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
## 2013-09-08 NOTE — ED Provider Notes (Signed)
Medical screening examination/treatment/procedure(s) were conducted as a shared visit with non-physician practitioner(s) and myself.  I personally evaluated the patient during the encounter.   Dione Booze, MD 09/08/13 1500

## 2013-09-08 NOTE — H&P (Signed)
Psychiatric Admission Assessment Adult  Patient Identification:  Antonio Alvarez  Date of Evaluation:  09/08/2013  Chief Complaint:  Alcohol Dependence Cocaine Abuse  History of Present Illness: This is a 32 year old African-American male. Admitted to [facility] from the [facility] ED with complaints of suicidal ideations, depression and drug use requesting treatment. Patient reports, "I went to the [facility] yesterday. I felt like my life was in danger because I felt like committing suicide. I kind of, sort of committed suicide, but not really. I have been depressed for a while because everything about my life is going wrong. I use drugs to deal with issues that are not so pleasant in my life. I have been using drugs all my life. But, I started using heavily about 2 weeks ago. Drugs help me forget bad stuff. I don't feel any emotional or physical pains when I use drugs. My longest sobriety is 1 week and I don't remember how long it has been. I use cocaine, THC and marijuana. I need to get to a point where I won't have to drink or use drugs no more. My pancrease is in trouble".  Elements:  Location:  [facility] adult unit. Quality:  Depressed, suicidal ideations, cocaine abuse. Severity:  Moderate. Timing:  "I have been depressed for months". Duration:  Chronic. Context:  "Everything in mylife is going bad, got depressed, became suicidal, attempted suicide, sort of.  Associated Signs/Synptoms:  Depression Symptoms:  depressed mood, feelings of worthlessness/guilt, hopelessness, anxiety, loss of energy/fatigue,  (Hypo) Manic Symptoms:  Hallucinations, Impulsivity, Irritable Mood,  Anxiety Symptoms:  Excessive Worry,  Psychotic Symptoms:  Hallucinations: Auditory  PTSD Symptoms: Had a traumatic exposure:  None reported  Psychiatric Specialty Exam: Physical Exam  Constitutional: He is oriented to person, place, and time. He appears well-developed.  HENT:  Head:  Normocephalic.  Eyes: Pupils are equal, round, and reactive to light.  Neck: Normal range of motion.  Cardiovascular: Normal rate.   Respiratory: Effort normal.  GI: Soft.  Genitourinary:  Did not assess  Musculoskeletal: Normal range of motion.  Neurological: He is alert and oriented to person, place, and time.  Skin: Skin is warm and dry.  Psychiatric: His speech is normal and behavior is normal. Thought content normal. His mood appears anxious (Rated #5). Cognition and memory are normal. He expresses impulsivity. He exhibits a depressed mood (Rated #6).    Review of Systems  Psychiatric/Behavioral: Positive for depression, hallucinations and substance abuse (Cocaine/THC dependence). Negative for suicidal ideas and memory loss. The patient is nervous/anxious and has insomnia.     Blood pressure 125/89, pulse 71, temperature 98.2 F (36.8 C), temperature source Oral, resp. rate 18, height 5\' 11"  (1.803 m), weight 62.37 kg (137 lb 8 oz), SpO2 97.00%.Body mass index is 19.19 kg/(m^2).  General Appearance: Disheveled  Eye Contact::  Poor  Speech:  Clear and Coherent  Volume:  Normal  Mood:  Depressed, Hopeless, Irritable and Worthless  Affect:  Flat  Thought Process:  Coherent and Intact  Orientation:  Full (Time, Place, and Person)  Thought Content:  Hallucinations: Auditory and Rumination  Suicidal Thoughts:  Yes.  without intent/plan  Homicidal Thoughts:  No  Memory:  Immediate;   Good Recent;   Good Remote;   Good  Judgement:  Fair  Insight:  Lacking  Psychomotor Activity:  Normal  Concentration:  Good  Recall:  Fair  Akathisia:  No  Handed:  Right  AIMS (if indicated):  Assets:  Desire for Improvement  Sleep:       Past Psychiatric History: Diagnosis: Schizophrenia, paranoia type   Hospitalizations: [facility]  Outpatient Care: None reported  Substance Abuse Care: None reported  Self-Mutilation: Denies  Suicidal Attempts: "Admitted attempts, sort of"  Violent  Behaviors: None reported   Past Medical History:   Past Medical History  Diagnosis Date  . Abscess     hx of abscess on neck  . Alcohol abuse   . Cellulitis   . Hyponatremia   . Leukocytosis   . Pancreatitis   . Depression    None.  Allergies:  No Known Allergies  PTA Medications: No prescriptions prior to admission    Previous Psychotropic Medications:  Medication/Dose  See medication lists               Substance Abuse History in the last 12 months:  yes  Take all your medications as prescribed by your mental healthcare provider. Report any adverse effects and or reactions from your medicines to your outpatient provider promptly. Patient is instructed and cautioned to not engage in alcohol and or illegal drug use while on prescription medicines. In the event of worsening symptoms, patient is instructed to call the crisis hotline, 911 and or go to the nearest ED for appropriate evaluation and treatment of symptoms. Follow-up with your primary care provider for your other medical issues, concerns and or health care needs.   Social History:  reports that he has been smoking Cigarettes.  He has a 1 pack-year smoking history. He uses smokeless tobacco. He reports that he drinks about 21.0 ounces of alcohol per week. He reports that he uses illicit drugs (Marijuana and Cocaine). Additional Social History:  Current Place of Residence: [place], [place]  Place of Birth: [place], [place]   Family Members: None reported  Marital Status:  Single  Children: 15 and 1/2 (1 child on the way)  Sons: unsure  Daughters: unsure  Relationships: Single  Education:  [employer] Problems/Performance: Completed high school  Religious Beliefs/Practices: NA  History of Abuse (Emotional/Phsycial/Sexual): None reported  Occupational Experiences: English as a second language teacher History:  None.  Legal History: None reported  Hobbies/Interests: None reported  Family  History:   Family History  Problem Relation Age of Onset  . Alcohol abuse Father     Results for orders placed during the hospital encounter of 09/07/13 (from the past 72 hour(s))  CBC WITH DIFFERENTIAL     Status: None   Collection Time    09/07/13 10:20 PM      Result Value Range   WBC 9.3  4.0 - 10.5 K/uL   RBC 5.07  4.22 - 5.81 MIL/uL   Hemoglobin 15.0  13.0 - 17.0 g/dL   HCT 47.8  29.5 - 62.1 %   MCV 86.2  78.0 - 100.0 fL   MCH 29.6  26.0 - 34.0 pg   MCHC 34.3  30.0 - 36.0 g/dL   RDW 30.8  65.7 - 84.6 %   Platelets 269  150 - 400 K/uL   Neutrophils Relative % 71  43 - 77 %   Neutro Abs 6.6  1.7 - 7.7 K/uL   Lymphocytes Relative 21  12 - 46 %   Lymphs Abs 2.0  0.7 - 4.0 K/uL   Monocytes Relative 7  3 - 12 %   Monocytes Absolute 0.7  0.1 - 1.0 K/uL   Eosinophils Relative 0  0 - 5 %   Eosinophils Absolute  0.0  0.0 - 0.7 K/uL   Basophils Relative 0  0 - 1 %   Basophils Absolute 0.0  0.0 - 0.1 K/uL  COMPREHENSIVE METABOLIC PANEL     Status: Abnormal   Collection Time    09/07/13 10:20 PM      Result Value Range   Sodium 141  135 - 145 mEq/L   Potassium 4.6  3.5 - 5.1 mEq/L   Chloride 101  96 - 112 mEq/L   CO2 28  19 - 32 mEq/L   Glucose, Bld 89  70 - 99 mg/dL   BUN 12  6 - 23 mg/dL   Creatinine, Ser 1.61  0.50 - 1.35 mg/dL   Calcium 9.1  8.4 - 09.6 mg/dL   Total Protein 8.2  6.0 - 8.3 g/dL   Albumin 4.5  3.5 - 5.2 g/dL   AST 31  0 - 37 U/L   ALT 20  0 - 53 U/L   Alkaline Phosphatase 132 (*) 39 - 117 U/L   Total Bilirubin 0.8  0.3 - 1.2 mg/dL   GFR calc non Af Amer >90  >90 mL/min   GFR calc Af Amer >90  >90 mL/min   Comment: (NOTE)     The eGFR has been calculated using the CKD EPI equation.     This calculation has not been validated in all clinical situations.     eGFR's persistently <90 mL/min signify possible Chronic Kidney     Disease.  LIPASE, BLOOD     Status: Abnormal   Collection Time    09/07/13 10:20 PM      Result Value Range   Lipase 166 (*) 11  - 59 U/L  ETHANOL     Status: None   Collection Time    09/07/13 10:20 PM      Result Value Range   Alcohol, Ethyl (B) <11  0 - 11 mg/dL   Comment:            LOWEST DETECTABLE LIMIT FOR     SERUM ALCOHOL IS 11 mg/dL     FOR MEDICAL PURPOSES ONLY  URINE RAPID DRUG SCREEN (HOSP PERFORMED)     Status: Abnormal   Collection Time    09/07/13 10:41 PM      Result Value Range   Opiates NONE DETECTED  NONE DETECTED   Cocaine POSITIVE (*) NONE DETECTED   Benzodiazepines NONE DETECTED  NONE DETECTED   Amphetamines NONE DETECTED  NONE DETECTED   Tetrahydrocannabinol POSITIVE (*) NONE DETECTED   Barbiturates NONE DETECTED  NONE DETECTED   Comment:            DRUG SCREEN FOR MEDICAL PURPOSES     ONLY.  IF CONFIRMATION IS NEEDED     FOR ANY PURPOSE, NOTIFY LAB     WITHIN 5 DAYS.                LOWEST DETECTABLE LIMITS     FOR URINE DRUG SCREEN     Drug Class       Cutoff (ng/mL)     Amphetamine      1000     Barbiturate      200     Benzodiazepine   200     Tricyclics       300     Opiates          300     Cocaine          300  THC              50   Psychological Evaluations:  Assessment:   DSM5: Schizophrenia Disorders:  Schizophrenia (295.7), Paranoia-type Obsessive-Compulsive Disorders:  NA Trauma-Stressor Disorders:  NA Substance/Addictive Disorders:  Alcohol Related Disorder - Moderate (303.90) and Cannabis Use Disorder - Moderate 9304.30), Cocaine abuse Depressive Disorders:  NA  AXIS I:  Alcohol Related Disorder - Moderate (303.90) and Cannabis Use Disorder - Moderate 9304.30) AXIS II:  Deferred AXIS III:   Past Medical History  Diagnosis Date  . Abscess     hx of abscess on neck  . Alcohol abuse   . Cellulitis   . Hyponatremia   . Leukocytosis   . Pancreatitis   . Depression    AXIS IV:  economic problems, housing problems, occupational problems, other psychosocial or environmental problems and chronic mental illness AXIS V:  21-30 behavior considerably  influenced by delusions or hallucinations OR serious impairment in judgment, communication OR inability to function in almost all areas  Treatment Plan/Recommendations: 1. Admit for crisis management and stabilization, estimated length of stay 3-5 days.  2. Medication management to reduce current symptoms to base line and improve the patient's overall level of functioning (a). Haldol 2 mg daily Q hs for mood control.                                (b). Cogentin 1 mg daily for prevention of EPS.                                (c). Haldol 0.5 mg bid prn for anxiety/mood control. 3. Treat health problems as indicated.  4. Develop treatment plan to decrease risk of relapse upon discharge and the need for readmission.  5. Psycho-social education regarding relapse prevention and self care.  6. Health care follow up as needed for medical problems.  7. Review, reconcile, and reinstate any pertinent home medications for other health issues where appropriate. 8. Call for consults with hospitalist for any additional specialty patient care services as needed.  Treatment Plan Summary: Daily contact with patient to assess and evaluate symptoms and progress in treatment Medication management  Current Medications:  Current Facility-Administered Medications  Medication Dose Route Frequency Provider Last Rate Last Dose  . acetaminophen (TYLENOL) tablet 650 mg  650 mg Oral Q6H PRN Kerry Hough, PA-C      . alum & mag hydroxide-simeth (MAALOX/MYLANTA) 200-200-20 MG/5ML suspension 30 mL  30 mL Oral Q4H PRN Kerry Hough, PA-C      . chlordiazePOXIDE (LIBRIUM) capsule 25 mg  25 mg Oral Q6H PRN Kerry Hough, PA-C      . chlordiazePOXIDE (LIBRIUM) capsule 25 mg  25 mg Oral QID Kerry Hough, PA-C       Followed by  . [START ON 09/09/2013] chlordiazePOXIDE (LIBRIUM) capsule 25 mg  25 mg Oral TID Kerry Hough, PA-C       Followed by  . [START ON 09/10/2013] chlordiazePOXIDE (LIBRIUM) capsule 25 mg  25  mg Oral BH-qamhs Spencer E Simon, PA-C       Followed by  . [START ON 09/11/2013] chlordiazePOXIDE (LIBRIUM) capsule 25 mg  25 mg Oral Daily Kerry Hough, PA-C      . hydrOXYzine (ATARAX/VISTARIL) tablet 25 mg  25 mg Oral Q6H PRN Kerry Hough, PA-C      .  loperamide (IMODIUM) capsule 2-4 mg  2-4 mg Oral PRN Kerry Hough, PA-C      . magnesium hydroxide (MILK OF MAGNESIA) suspension 30 mL  30 mL Oral Daily PRN Kerry Hough, PA-C      . multivitamin with minerals tablet 1 tablet  1 tablet Oral Daily Kerry Hough, PA-C      . ondansetron (ZOFRAN-ODT) disintegrating tablet 4 mg  4 mg Oral Q6H PRN Kerry Hough, PA-C      . [START ON 09/09/2013] thiamine (VITAMIN B-1) tablet 100 mg  100 mg Oral Daily Spencer E Simon, PA-C      . traZODone (DESYREL) tablet 50 mg  50 mg Oral QHS,MR X 1 Kerry Hough, PA-C        Observation Level/Precautions:  15 minute checks  Laboratory:  Reviewed ED lab findings on file  Psychotherapy: Group sessions   Medications:  See medication lists  Consultations:  As needed  Discharge Concerns: Maintaining sobriety  Estimated LOS: 2-4 days  Other:     I certify that inpatient services furnished can reasonably be expected to improve the patient's condition.   Sanjuana Kava, PMHNP, FNP 12/25/201410:03 AM  I have personally seen the patient and agreed with the findings and involved in the treatment plan. Kathryne Sharper, MD

## 2013-09-08 NOTE — Progress Notes (Signed)
D: pt laying in bed. Asleep, easily awakened. Pt denies having any withdrawal symptoms at the time. Pt states he is just really tired. Denies SI/HI/AVH. Denies having pain. Pt is calm and cooperative. Pt does have a slight body odor.  A: q 15 min safety checks. Scheduled medications given. Support and encouragement offered R: pt remains safe on unit. No signs of distress or complaints at this time

## 2013-09-08 NOTE — Progress Notes (Signed)
Patient ID: Antonio Alvarez, male   DOB: July 10, 1981, 32 y.o.   MRN: [id number]  Antonio Alvarez is a 32 year old African-American male admitted to [facility] for alcohol detox and positive suicidal ideation. Patient's UDS is positive for cocaine and THC. Patient reports drinking nearly every day and can drink between 1 and 10 drinks at a time. Patient reports pain due to pancreatitis. Patient states that he will drink until "it messes up." Patient denies SI/HI and A/V hallucinations to writer but reports that he has off and on thoughts of suicide pretty regularly. Patient verbally contracts for safety. Patient also reports that he hears voices at times. They are not commanding in nature and patient can't understand them very well. Patient had no questions or concerns during the admission process. Patient was receptive and cooperative but was anxious during the admission. Patient was oriented to the unit and shown to his room. Q15 minute safety checks were initiated and are maintained. Patient received Vitamin B-1 Injection.

## 2013-09-08 NOTE — ED Notes (Signed)
Spoke with Earley Favor NP re: lipase= 166. No new orders received.

## 2013-09-08 NOTE — Tx Team (Signed)
Initial Interdisciplinary Treatment Plan  PATIENT STRENGTHS: (choose at least two) Average or above average intelligence General fund of knowledge  PATIENT STRESSORS: Financial difficulties Health problems Substance abuse   PROBLEM LIST: Problem List/Patient Goals Date to be addressed Date deferred Reason deferred Estimated date of resolution  Substance Abuse 09/08/2013                                                      DISCHARGE CRITERIA:  Ability to meet basic life and health needs Adequate post-discharge living arrangements Verbal commitment to aftercare and medication compliance Withdrawal symptoms are absent or subacute and managed without 24-hour nursing intervention  PRELIMINARY DISCHARGE PLAN: Attend PHP/IOP Attend 12-step recovery group  PATIENT/FAMIILY INVOLVEMENT: This treatment plan has been presented to and reviewed with the patient, Antonio Alvarez.  The patient and family have been given the opportunity to ask questions and make suggestions.  Ned Clines, Antonio Alvarez 09/08/2013, 6:04 AM

## 2013-09-08 NOTE — BH Assessment (Addendum)
Assessment Note  Antonio Alvarez is a 32 y.o. male who presents to [facility] for alcohol detox and SI/Depression.  Pt is homeless and lives in "shed of a store.  Pt says he's been SI x2wks and has plan to overdose on pills.  Pt has no past SI attempts and no inpt/outpt mental health services.  Pt reports stressors: homelessness, financial issues, unemployment and girlfriend is pregnant("she is running around in the streets"). Pt denies HI, but endorses auditory halluc w/o command--"I been hearing voices off and on for 6-7 years".  Pt is not actively psychotic.  Pt says he drinks 7-8, 12oz cans of wine and 1-40 oz, daily, last use was 09/06/13.  Pt uses $10 worth of cocaine, daily, last use was 09/07/13 and he uses a "nickel" bag of marijuana, daily, last use was 09/07/13.  Pt denies any current w/d sxs, no hx of seizures, but does have blackouts when he drinks.  Pt denies HI.  This contacted Earley Favor, NP for clinical information and disposition for patient.  Pt does have critical lipase level and c/o abdominal pain but did not request and pain meds.  Pt has tolerated food/drink, had sprite while in [employer].  Pt accepted by Donell Sievert, [id number].  Axis I: Depressive Disorder NOS and Alcohol Dependence, Cocaine Abuse, Cannabis Abuse  Axis II: Deferred Axis III:  Past Medical History  Diagnosis Date  . Abscess     hx of abscess on neck  . Alcohol abuse   . Cellulitis   . Hyponatremia   . Leukocytosis   . Pancreatitis   . Depression    Axis IV: economic problems, housing problems, occupational problems, other psychosocial or environmental problems, problems related to social environment and problems with primary support group Axis V: 41-50 serious symptoms  Past Medical History:  Past Medical History  Diagnosis Date  . Abscess     hx of abscess on neck  . Alcohol abuse   . Cellulitis   . Hyponatremia   . Leukocytosis   . Pancreatitis   . Depression     Past Surgical History   Procedure Laterality Date  . Abscess removed from neck      Family History:  Family History  Problem Relation Age of Onset  . Alcohol abuse Father     Social History:  reports that he has been smoking Cigarettes.  He has a 1 pack-year smoking history. He uses smokeless tobacco. He reports that he drinks about 21.0 ounces of alcohol per week. He reports that he uses illicit drugs (Marijuana and Cocaine).  Additional Social History:  Alcohol / Drug Use Pain Medications: None  Prescriptions: None  Over the Counter: none  History of alcohol / drug use?: Yes Longest period of sobriety (when/how long): None  Negative Consequences of Use: Work / School;Personal relationships;Financial Withdrawal Symptoms: Other (Comment) (No current w/d sxs ) Substance #1 Name of Substance 1: Alcohol  1 - Age of First Use: Teens  1 - Amount (size/oz): 7-8 12 oz cans of wine; 1-40  1 - Frequency: Daily  1 - Duration: On-going  1 - Last Use / Amount: 09/06/13 Substance #2 Name of Substance 2: Cocaine  2 - Age of First Use: Teens  2 - Amount (size/oz): $10 2 - Frequency: Daily  2 - Duration: On-going  2 - Last Use / Amount: 09/07/13 Substance #3 Name of Substance 3: THC  3 - Age of First Use: Teens  3 - Amount (size/oz): "Nickel' 3 -  Frequency: Daily  3 - Duration: On-going  3 - Last Use / Amount: 09/07/13  CIWA: CIWA-Ar BP: 135/70 mmHg Pulse Rate: 71 COWS:    Allergies: No Known Allergies  Home Medications:  (Not in a hospital admission)  OB/GYN Status:  No LMP for male patient.  General Assessment Data Location of Assessment: [facility] ED Is this a Tele or Face-to-Face Assessment?: Tele Assessment Is this an Initial Assessment or a Re-assessment for this encounter?: Initial Assessment Living Arrangements: Other (Comment) (Lives in the shed of store--homeless ) Can pt return to current living arrangement?: Yes Admission Status: Voluntary Is patient capable of signing voluntary  admission?: Yes Transfer from: Acute Hospital Referral Source: MD  Medical Screening Exam [facility] Walk-in ONLY) Medical Exam completed: No Reason for MSE not completed: Other: (None )  [facility] Crisis Care Plan Living Arrangements: Other (Comment) (Lives in the shed of store--homeless ) Name of Psychiatrist: None  Name of Therapist: None   Education Status Is patient currently in school?: No Current Grade: None  Highest grade of school patient has completed: None  Name of school: None  Contact person: None   Risk to self Suicidal Ideation: Yes-Currently Present Suicidal Intent: Yes-Currently Present Is patient at risk for suicide?: Yes Suicidal Plan?: Yes-Currently Present Specify Current Suicidal Plan: Overdose  Access to Means: Yes Specify Access to Suicidal Means: Pills  What has been your use of drugs/alcohol within the last 12 months?: Abusing: alcohol, cocaine, thc  Previous Attempts/Gestures: No How many times?: 0 Other Self Harm Risks: None  Triggers for Past Attempts: None known Intentional Self Injurious Behavior: None Family Suicide History: No Recent stressful life event(s): Financial Problems;Other (Comment) (Homeless, health, girlfriend's pregnancy ) Persecutory voices/beliefs?: No Depression: Yes Depression Symptoms: Loss of interest in usual pleasures Substance abuse history and/or treatment for substance abuse?: Yes Suicide prevention information given to non-admitted patients: Not applicable  Risk to Others Homicidal Ideation: No Thoughts of Harm to Others: No Current Homicidal Intent: No Current Homicidal Plan: No Access to Homicidal Means: No Identified Victim: None  History of harm to others?: No Assessment of Violence: None Noted Violent Behavior Description: None  Does patient have access to weapons?: No Criminal Charges Pending?: No Does patient have a court date: No  Psychosis Hallucinations: Auditory Delusions: None noted  Mental Status  Report Appear/Hygiene: Disheveled;Poor hygiene Eye Contact: Fair Motor Activity: Unremarkable Speech: Logical/coherent Level of Consciousness: Alert Mood: Depressed;Sad Affect: Depressed;Sad Anxiety Level: None Thought Processes: Coherent;Relevant Judgement: Impaired Orientation: Person;Place;Time;Situation Obsessive Compulsive Thoughts/Behaviors: None  Cognitive Functioning Concentration: Normal Memory: Recent Intact;Remote Intact IQ: Average Insight: Poor Impulse Control: Poor Appetite: Good Weight Loss: 0 Weight Gain: 0 Sleep: No Change Total Hours of Sleep: 5 Vegetative Symptoms: None  ADLScreening [facility] Assessment Services) Patient's cognitive ability adequate to safely complete daily activities?: Yes Patient able to express need for assistance with ADLs?: Yes Independently performs ADLs?: Yes (appropriate for developmental age)  Prior Inpatient Therapy Prior Inpatient Therapy: No Prior Therapy Dates: None  Prior Therapy Facilty/Provider(s): None  Reason for Treatment: None   Prior Outpatient Therapy Prior Outpatient Therapy: No Prior Therapy Dates: None  Prior Therapy Facilty/Provider(s): None  Reason for Treatment: None   ADL Screening (condition at time of admission) Patient's cognitive ability adequate to safely complete daily activities?: Yes Is the patient deaf or have difficulty hearing?: No Does the patient have difficulty seeing, even when wearing glasses/contacts?: No Does the patient have difficulty concentrating, remembering, or making decisions?: No Patient able to express need  for assistance with ADLs?: Yes Does the patient have difficulty dressing or bathing?: No Independently performs ADLs?: Yes (appropriate for developmental age)  Home Assistive Devices/Equipment Home Assistive Devices/Equipment: None  Therapy Consults (therapy consults require a physician order) PT Evaluation Needed: No OT Evalulation Needed: No SLP Evaluation Needed:  No Abuse/Neglect Assessment (Assessment to be complete while patient is alone) Physical Abuse: Denies Verbal Abuse: Denies Sexual Abuse: Denies Exploitation of patient/patient's resources: Denies Self-Neglect: Denies Values / Beliefs Cultural Requests During Hospitalization: None Spiritual Requests During Hospitalization: None Consults Spiritual Care Consult Needed: No Social Work Consult Needed: No Merchant navy officer (For Healthcare) Advance Directive: Patient does not have advance directive;Patient would not like information Pre-existing out of facility DNR order (yellow form or pink MOST form): No Nutrition Screen- [facility] Adult/WL/AP Patient's home diet: Regular  Additional Information 1:1 In Past 12 Months?: No CIRT Risk: No Elopement Risk: No Does patient have medical clearance?: Yes     Disposition:  Disposition Initial Assessment Completed for this Encounter: Yes Disposition of Patient: Referred to [facility] ) Patient referred to: Other (Comment) [facility] )  On Site Evaluation by:   Reviewed with Physician:    Murrell Redden 09/08/2013 2:22 AM

## 2013-09-09 DIAGNOSIS — F1420 Cocaine dependence, uncomplicated: Secondary | ICD-10-CM | POA: Diagnosis present

## 2013-09-09 DIAGNOSIS — F1220 Cannabis dependence, uncomplicated: Secondary | ICD-10-CM | POA: Diagnosis present

## 2013-09-09 DIAGNOSIS — F200 Paranoid schizophrenia: Secondary | ICD-10-CM | POA: Diagnosis present
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
## 2013-09-09 NOTE — BHH Group Notes (Signed)
[facility] LCSW Aftercare Discharge Planning Group Note   09/09/2013 1:14 PM  Participation Quality:  DID NOT ATTEND-pt sleeping  Smart, HeatherLCSWA

## 2013-09-09 NOTE — Tx Team (Signed)
Interdisciplinary Treatment Plan Update (Adult)  Date: 09/09/2013   Time Reviewed: 3:02 PM  Progress in Treatment:  Attending groups: No Participating in groups:  No Taking medication as prescribed: Yes  Tolerating medication: Yes  Family/Significant othe contact made: No  Patient understands diagnosis: Yes, AEB asking for help with substance abuse, detox and getting into rehab Discussing patient identified problems/goals with staff: Yes  See initial care plan Medical problems stabilized or resolved: Yes  Denies suicidal/homicidal ideation: Yes In tx team Patient has not harmed self or Others: Yes  New problem(s) identified:  Discharge Plan or Barriers: hopes to get into rehab Additional comments: N/A  Reason for Continuation of Hospitalization:  Depression, librium protocol for alcohol withdrawal Estimated length of stay: 3-4 days For review of initial/current patient goals, please see plan of care.  Antonio Alvarez reports, "I"m feeling kind of, sort of depressed. My hands shake all the time. I feel like hurting someone, those that hurtme or are after me, if you know what I mean. It is just a thought, not that I'm going to hurt them. I feel very tired. Denies SI. Rated depression at #4.     Attendees:  Patient:    Family:    Physician:    Nursing:    Clinical Social Worker [employer], LCSWA  09/09/2013 3:01 PM   Other: Chandra Batch. PA 09/09/2013 3:01 PM   Other: Daryel Gerald, LCSW 09/09/2013 3:01 PM  Other: Darden Dates Nurse CM 09/09/2013 3:01 PM   Other:    Scribe for Treatment Team:  Daryel Gerald LCSW 09/09/2013 3:02 PM

## 2013-09-09 NOTE — Progress Notes (Signed)
Patient observed in bed. Patient denies SI, HI, AVH at present. Reports passive SI, HI during the day. Contracts for safety. Denies anxiety, rates depression 4/10.   Encouragement offered. Medications given as ordered.  Patient safety maintained, Q 15 checks continue.

## 2013-09-09 NOTE — Progress Notes (Signed)
Patient ID: Antonio Alvarez, male   DOB: 07-29-1981, 32 y.o.   MRN: [id number] Pt did not attend group. He was asleep in bed.

## 2013-09-09 NOTE — BHH Group Notes (Signed)
BHH LCSW Group Therapy  09/09/2013 3:52 PM  Type of Therapy:  Group Therapy  Participation Level:  Did Not Attend-Pt in bed/asleep   Smart, HeatherLCSWA  09/09/2013, 3:52 PM

## 2013-09-09 NOTE — Progress Notes (Signed)
D: Patient denies HI and visual hallucinations and on and off thoughts of SI; reports some auditory hallucinations and states its only small talk and its nothing dangerous  A: Monitored q 15 minutes; patient encouraged to attend groups; patient educated about medications; patient given medications per physician orders; patient encouraged to express feelings and/or concerns  R: Patient is calm and cooperative; patient likes to sit and play the piano; patient is denying withdrawal symptoms; patient's interaction with staff and peers is appropriate; patient was able to set goal to talk with staff 1:1 when having feelings of SI; patient is taking medications as prescribed and tolerating medications; patient is not attending any groups

## 2013-09-09 NOTE — Progress Notes (Addendum)
[facility] MD Progress Note  09/09/2013 1:54 PM Antonio Alvarez  MRN:  [id number]  Subjective:  Antonio Alvarez reports, "I feeling kind of, sort of depressed. My hands shake all the time. I feel like hurting someone, those that hurt or after me, if you know what I mean. It is just a thought, not that I'm going to hurt them. I feel very tired. Denies SI. Rated depression at #4.  Diagnosis:   DSM5: Schizophrenia Disorders:  Schizophrenia (295.7) Obsessive-Compulsive Disorders:  NA Trauma-Stressor Disorders:  NA Substance/Addictive Disorders:  Cannabis Use Disorder - Severe (304.30) Depressive Disorders:  Substance induced mood disorder  Axis I: Cannabis Use Disorder - Severe (304.30), Paranoid schizophrenia, Cocaine dependence, Substance induced mood disorder Axis II: Deferred Axis III:  Past Medical History  Diagnosis Date  . Abscess     hx of abscess on neck  . Alcohol abuse   . Cellulitis   . Hyponatremia   . Leukocytosis   . Pancreatitis   . Depression    Axis IV: other psychosocial or environmental problems and polysubstance dependence Axis V: 41-50 serious symptoms  ADL's:  Fairly intact  Sleep: Fair  Appetite:  Fair  Suicidal Ideation:  Plan:  Denies Intent:  Denies Means:  Denies  Homicidal Ideation: "yes" Plan:  Denies Intent:  Denies Means:  Denies AEB (as evidenced by):  Psychiatric Specialty Exam: Review of Systems  Constitutional: Negative.   HENT: Negative.   Eyes: Negative.   Respiratory: Negative.   Cardiovascular: Negative.   Gastrointestinal: Negative.   Genitourinary: Negative.   Musculoskeletal: Positive for myalgias.  Skin: Negative.   Neurological: Positive for tremors.  Endo/Heme/Allergies: Negative.   Psychiatric/Behavioral: Positive for depression (Rated #4) and substance abuse (Alcoholism). Negative for suicidal ideas, hallucinations and memory loss. The patient is nervous/anxious and has insomnia.     Blood pressure 116/71, pulse 65,  temperature 97.8 F (36.6 C), temperature source Oral, resp. rate 16, height 5\' 11"  (1.803 m), weight 62.37 kg (137 lb 8 oz), SpO2 97.00%.Body mass index is 19.19 kg/(m^2).  General Appearance: Fairly Groomed  Patent attorney::  Good  Speech:  Clear and Coherent  Volume:  Normal  Mood:  "I kind of, sort of depressed"  Affect:  Restricted  Thought Process:  Coherent and Intact  Orientation:  Full (Time, Place, and Person)  Thought Content:  Hallucinations: Auditory and Paranoid Ideation  Suicidal Thoughts:  No  Homicidal Thoughts:  Yes.  without intent/plan  Memory:  Immediate;   Good Recent;   Good Remote;   Fair  Judgement:  Impaired  Insight:  Lacking  Psychomotor Activity:  Tremor  Concentration:  Fair  Recall:  Fair  Akathisia:  No  Handed:  Right  AIMS (if indicated):     Assets:  Desire for Improvement  Sleep:  Number of Hours: 5.75   Current Medications: Current Facility-Administered Medications  Medication Dose Route Frequency Provider Last Rate Last Dose  . acetaminophen (TYLENOL) tablet 650 mg  650 mg Oral Q6H PRN Kerry Hough, PA-C      . alum & mag hydroxide-simeth (MAALOX/MYLANTA) 200-200-20 MG/5ML suspension 30 mL  30 mL Oral Q4H PRN Kerry Hough, PA-C      . benztropine (COGENTIN) tablet 1 mg  1 mg Oral Daily Sanjuana Kava, NP   1 mg at 09/09/13 4098  . chlordiazePOXIDE (LIBRIUM) capsule 25 mg  25 mg Oral Q6H PRN Kerry Hough, PA-C      . chlordiazePOXIDE (LIBRIUM) capsule 25 mg  25 mg Oral TID Kerry Hough, PA-C   25 mg at 09/09/13 1610   Followed by  . [START ON 09/10/2013] chlordiazePOXIDE (LIBRIUM) capsule 25 mg  25 mg Oral BH-qamhs Spencer E Simon, PA-C       Followed by  . [START ON 09/11/2013] chlordiazePOXIDE (LIBRIUM) capsule 25 mg  25 mg Oral Daily Kerry Hough, PA-C      . haloperidol (HALDOL) tablet 0.5 mg  0.5 mg Oral BID PRN Sanjuana Kava, NP      . haloperidol (HALDOL) tablet 2 mg  2 mg Oral Q2000 Sanjuana Kava, NP   2 mg at 09/08/13  9604  . hydrOXYzine (ATARAX/VISTARIL) tablet 25 mg  25 mg Oral Q6H PRN Kerry Hough, PA-C      . loperamide (IMODIUM) capsule 2-4 mg  2-4 mg Oral PRN Kerry Hough, PA-C      . magnesium hydroxide (MILK OF MAGNESIA) suspension 30 mL  30 mL Oral Daily PRN Kerry Hough, PA-C      . multivitamin with minerals tablet 1 tablet  1 tablet Oral Daily Kerry Hough, PA-C   1 tablet at 09/09/13 [phone]  . ondansetron (ZOFRAN-ODT) disintegrating tablet 4 mg  4 mg Oral Q6H PRN Kerry Hough, PA-C      . thiamine (VITAMIN B-1) tablet 100 mg  100 mg Oral Daily Kerry Hough, PA-C   100 mg at 09/09/13 8119  . traZODone (DESYREL) tablet 50 mg  50 mg Oral QHS,MR X 1 Kerry Hough, PA-C   50 mg at 09/08/13 2201    Lab Results:  Results for orders placed during the hospital encounter of 09/07/13 (from the past 48 hour(s))  CBC WITH DIFFERENTIAL     Status: None   Collection Time    09/07/13 10:20 PM      Result Value Range   WBC 9.3  4.0 - 10.5 K/uL   RBC 5.07  4.22 - 5.81 MIL/uL   Hemoglobin 15.0  13.0 - 17.0 g/dL   HCT 14.7  82.9 - 56.2 %   MCV 86.2  78.0 - 100.0 fL   MCH 29.6  26.0 - 34.0 pg   MCHC 34.3  30.0 - 36.0 g/dL   RDW 13.0  86.5 - 78.4 %   Platelets 269  150 - 400 K/uL   Neutrophils Relative % 71  43 - 77 %   Neutro Abs 6.6  1.7 - 7.7 K/uL   Lymphocytes Relative 21  12 - 46 %   Lymphs Abs 2.0  0.7 - 4.0 K/uL   Monocytes Relative 7  3 - 12 %   Monocytes Absolute 0.7  0.1 - 1.0 K/uL   Eosinophils Relative 0  0 - 5 %   Eosinophils Absolute 0.0  0.0 - 0.7 K/uL   Basophils Relative 0  0 - 1 %   Basophils Absolute 0.0  0.0 - 0.1 K/uL  COMPREHENSIVE METABOLIC PANEL     Status: Abnormal   Collection Time    09/07/13 10:20 PM      Result Value Range   Sodium 141  135 - 145 mEq/L   Potassium 4.6  3.5 - 5.1 mEq/L   Chloride 101  96 - 112 mEq/L   CO2 28  19 - 32 mEq/L   Glucose, Bld 89  70 - 99 mg/dL   BUN 12  6 - 23 mg/dL   Creatinine, Ser 6.96  0.50 - 1.35 mg/dL  Calcium  9.1  8.4 - 10.5 mg/dL   Total Protein 8.2  6.0 - 8.3 g/dL   Albumin 4.5  3.5 - 5.2 g/dL   AST 31  0 - 37 U/L   ALT 20  0 - 53 U/L   Alkaline Phosphatase 132 (*) 39 - 117 U/L   Total Bilirubin 0.8  0.3 - 1.2 mg/dL   GFR calc non Af Amer >90  >90 mL/min   GFR calc Af Amer >90  >90 mL/min   Comment: (NOTE)     The eGFR has been calculated using the CKD EPI equation.     This calculation has not been validated in all clinical situations.     eGFR's persistently <90 mL/min signify possible Chronic Kidney     Disease.  LIPASE, BLOOD     Status: Abnormal   Collection Time    09/07/13 10:20 PM      Result Value Range   Lipase 166 (*) 11 - 59 U/L  ETHANOL     Status: None   Collection Time    09/07/13 10:20 PM      Result Value Range   Alcohol, Ethyl (B) <11  0 - 11 mg/dL   Comment:            LOWEST DETECTABLE LIMIT FOR     SERUM ALCOHOL IS 11 mg/dL     FOR MEDICAL PURPOSES ONLY  URINE RAPID DRUG SCREEN (HOSP PERFORMED)     Status: Abnormal   Collection Time    09/07/13 10:41 PM      Result Value Range   Opiates NONE DETECTED  NONE DETECTED   Cocaine POSITIVE (*) NONE DETECTED   Benzodiazepines NONE DETECTED  NONE DETECTED   Amphetamines NONE DETECTED  NONE DETECTED   Tetrahydrocannabinol POSITIVE (*) NONE DETECTED   Barbiturates NONE DETECTED  NONE DETECTED   Comment:            DRUG SCREEN FOR MEDICAL PURPOSES     ONLY.  IF CONFIRMATION IS NEEDED     FOR ANY PURPOSE, NOTIFY LAB     WITHIN 5 DAYS.                LOWEST DETECTABLE LIMITS     FOR URINE DRUG SCREEN     Drug Class       Cutoff (ng/mL)     Amphetamine      1000     Barbiturate      200     Benzodiazepine   200     Tricyclics       300     Opiates          300     Cocaine          300     THC              50    Physical Findings: AIMS: Facial and Oral Movements Muscles of Facial Expression: None, normal Lips and Perioral Area: None, normal Jaw: None, normal Tongue: None, normal,Extremity  Movements Upper (arms, wrists, hands, fingers): None, normal Lower (legs, knees, ankles, toes): None, normal, Trunk Movements Neck, shoulders, hips: None, normal, Overall Severity Severity of abnormal movements (highest score from questions above): None, normal Incapacitation due to abnormal movements: None, normal Patient's awareness of abnormal movements (rate only patient's report): No Awareness, Dental Status Current problems with teeth and/or dentures?: No Does patient usually wear dentures?: No  CIWA:  CIWA-Ar Total: 0 COWS:  COWS Total Score: 2  Treatment Plan Summary: Daily contact with patient to assess and evaluate symptoms and progress in treatment Medication management  Plan: Supportive approach/coping skills/relapse prevention. Encouraged out of room, participation in group sessions and application of coping skills when distressed. Will continue to monitor response to/adverse effects of medications in use to assure effectiveness. Continue to monitor mood, behavior and interaction with staff and other patients. Continue current plan of care.  Medical Decision Making Problem Points:  Established problem, stable/improving (1), Review of last therapy session (1) and Review of psycho-social stressors (1) Data Points:  Review of medication regiment & side effects (2) Review of new medications or change in dosage (2)  I certify that inpatient services furnished can reasonably be expected to improve the patient's condition.   Armandina Stammer I, PMHNP, FNP-bc 09/09/2013, 1:54 PM I agreed with the findings, treatment and disposition plan of this patient. Kathryne Sharper, MD

## 2013-09-09 NOTE — Progress Notes (Signed)
Adult Psychoeducational Group Note  Date:  09/09/2013 Time:  10:59 PM  Group Topic/Focus:  AA group  Participation Level:  Minimal  Participation Quality:  Appropriate  Affect:  Flat  Cognitive:  Alert  Insight: Appropriate  Engagement in Group:  Limited  Modes of Intervention:  Discussion  Additional Comments:  Pt sat in group, but did not participate.  Kaleen Odea R 09/09/2013, 10:59 PM

## 2013-09-10 DIAGNOSIS — F1010 Alcohol abuse, uncomplicated: Secondary | ICD-10-CM

## 2013-09-10 NOTE — [facility] Group Notes (Signed)
[facility] Group Notes:  (Nursing/MHT/Case Management/Adjunct)  Date:  09/10/2013  Time:  10:02 AM  Type of Therapy:  Psychoeducational Skills  Participation Level:  Active  Participation Quality:  Sharing  Affect:  Depressed and Flat  Cognitive:  Oriented  Insight:  Appropriate  Engagement in Group:  Engaged  Modes of Intervention:  Clarification, Discussion, Education, Problem-solving, Socialization and Support  Summary of Progress/Problems: Pt stated that it was his focus that he would work on Pharmacologist for dealing with his past.  Stated he wanted to do the best he could in his treatment here.  Antonio Alvarez 09/10/2013, 10:02 AM

## 2013-09-10 NOTE — Progress Notes (Signed)
Patient ID: Antonio Alvarez, male   DOB: December 13, 1980, 32 y.o.   MRN: [id number] [facility] MD Progress Note  09/10/2013 5:24 PM DORAL VENTRELLA  MRN:  [id number]  Subjective:  Antonio Alvarez reports, that he feels his body is responding to the detoxification treatment protocols. He says his anxiety is some what lessening, but his tremors remain. Antonio Alvarez says he would like to go to a long term treatment center after discharge. Would like to get his drinking problems and drug use taken care of to save his pancrease.  Diagnosis:   DSM5: Schizophrenia Disorders:  Schizophrenia (295.7) Obsessive-Compulsive Disorders:  NA Trauma-Stressor Disorders:  NA Substance/Addictive Disorders:  Cannabis Use Disorder - Severe (304.30) Depressive Disorders:  Substance induced mood disorder  Axis I: Cannabis Use Disorder - Severe (304.30), Paranoid schizophrenia, Cocaine dependence, Substance induced mood disorder Axis II: Deferred Axis III:  Past Medical History  Diagnosis Date  . Abscess     hx of abscess on neck  . Alcohol abuse   . Cellulitis   . Hyponatremia   . Leukocytosis   . Pancreatitis   . Depression    Axis IV: other psychosocial or environmental problems and polysubstance dependence Axis V: 41-50 serious symptoms  ADL's:  Fairly intact  Sleep: Fair  Appetite:  Fair  Suicidal Ideation:  Plan:  Denies Intent:  Denies Means:  Denies  Homicidal Ideation: "yes" Plan:  Denies Intent:  Denies Means:  Denies AEB (as evidenced by):  Psychiatric Specialty Exam: Review of Systems  Constitutional: Negative.   HENT: Negative.   Eyes: Negative.   Respiratory: Negative.   Cardiovascular: Negative.   Gastrointestinal: Negative.   Genitourinary: Negative.   Musculoskeletal: Positive for myalgias.  Skin: Negative.   Neurological: Positive for tremors.  Endo/Heme/Allergies: Negative.   Psychiatric/Behavioral: Positive for depression (Rated #4) and substance abuse (Alcoholism). Negative for suicidal  ideas, hallucinations and memory loss. The patient is nervous/anxious and has insomnia.     Blood pressure 109/63, pulse 84, temperature 97.5 F (36.4 C), temperature source Oral, resp. rate 16, height 5\' 11"  (1.803 m), weight 62.37 kg (137 lb 8 oz), SpO2 97.00%.Body mass index is 19.19 kg/(m^2).  General Appearance: Fairly Groomed  Patent attorney::  Good  Speech:  Clear and Coherent  Volume:  Normal  Mood:  "I kind of, sort of depressed"  Affect:  Restricted  Thought Process:  Coherent and Intact  Orientation:  Full (Time, Place, and Person)  Thought Content:  Hallucinations: Auditory and Paranoid Ideation  Suicidal Thoughts:  No  Homicidal Thoughts:  Yes.  without intent/plan  Memory:  Immediate;   Good Recent;   Good Remote;   Fair  Judgement:  Impaired  Insight:  Lacking  Psychomotor Activity:  Tremor  Concentration:  Fair  Recall:  Fair  Akathisia:  No  Handed:  Right  AIMS (if indicated):     Assets:  Desire for Improvement  Sleep:  Number of Hours: 5.75   Current Medications: Current Facility-Administered Medications  Medication Dose Route Frequency Provider Last Rate Last Dose  . acetaminophen (TYLENOL) tablet 650 mg  650 mg Oral Q6H PRN Kerry Hough, PA-C      . alum & mag hydroxide-simeth (MAALOX/MYLANTA) 200-200-20 MG/5ML suspension 30 mL  30 mL Oral Q4H PRN Kerry Hough, PA-C      . benztropine (COGENTIN) tablet 1 mg  1 mg Oral Daily Sanjuana Kava, NP   1 mg at 09/10/13 0808  . chlordiazePOXIDE (LIBRIUM) capsule 25 mg  25  mg Oral Q6H PRN Kerry Hough, PA-C      . chlordiazePOXIDE (LIBRIUM) capsule 25 mg  25 mg Oral BH-qamhs Kerry Hough, PA-C   25 mg at 09/10/13 0809   Followed by  . [START ON 09/11/2013] chlordiazePOXIDE (LIBRIUM) capsule 25 mg  25 mg Oral Daily Spencer E Simon, PA-C      . haloperidol (HALDOL) tablet 0.5 mg  0.5 mg Oral BID PRN Sanjuana Kava, NP      . haloperidol (HALDOL) tablet 2 mg  2 mg Oral Q2000 Sanjuana Kava, NP   2 mg at 09/09/13  2002  . hydrOXYzine (ATARAX/VISTARIL) tablet 25 mg  25 mg Oral Q6H PRN Kerry Hough, PA-C      . loperamide (IMODIUM) capsule 2-4 mg  2-4 mg Oral PRN Kerry Hough, PA-C      . magnesium hydroxide (MILK OF MAGNESIA) suspension 30 mL  30 mL Oral Daily PRN Kerry Hough, PA-C      . multivitamin with minerals tablet 1 tablet  1 tablet Oral Daily Kerry Hough, PA-C   1 tablet at 09/10/13 4540  . ondansetron (ZOFRAN-ODT) disintegrating tablet 4 mg  4 mg Oral Q6H PRN Kerry Hough, PA-C      . thiamine (VITAMIN B-1) tablet 100 mg  100 mg Oral Daily Kerry Hough, PA-C   100 mg at 09/10/13 9811  . traZODone (DESYREL) tablet 50 mg  50 mg Oral QHS,MR X 1 Kerry Hough, PA-C   50 mg at 09/09/13 2110    Lab Results:  No results found for this or any previous visit (from the past 48 hour(s)).  Physical Findings: AIMS: Facial and Oral Movements Muscles of Facial Expression: None, normal Lips and Perioral Area: None, normal Jaw: None, normal Tongue: None, normal,Extremity Movements Upper (arms, wrists, hands, fingers): None, normal Lower (legs, knees, ankles, toes): None, normal, Trunk Movements Neck, shoulders, hips: None, normal, Overall Severity Severity of abnormal movements (highest score from questions above): None, normal Incapacitation due to abnormal movements: None, normal Patient's awareness of abnormal movements (rate only patient's report): No Awareness, Dental Status Current problems with teeth and/or dentures?: No Does patient usually wear dentures?: No  CIWA:  CIWA-Ar Total: 1 COWS:  COWS Total Score: 2  Treatment Plan Summary: Daily contact with patient to assess and evaluate symptoms and progress in treatment Medication management  Plan: Supportive approach/coping skills/relapse prevention. Continue detox treatment. Encouraged out of room, participation in group sessions and application of coping skills when distressed. Will continue to monitor response  to/adverse effects of medications in use to assure effectiveness. Continue to monitor mood, behavior and interaction with staff and other patients. Continue current plan of care.  Medical Decision Making Problem Points:  Established problem, stable/improving (1), Review of last therapy session (1) and Review of psycho-social stressors (1) Data Points:  Review of medication regiment & side effects (2) Review of new medications or change in dosage (2)  I certify that inpatient services furnished can reasonably be expected to improve the patient's condition.   Sanjuana Kava, PMHNP, FNP-bc 09/10/2013, 5:24 PM  I agreed with the findings, treatment and disposition plan of this patient. Kathryne Sharper, MD

## 2013-09-10 NOTE — Progress Notes (Signed)
09-10-13  NSG NOTE  7a-7p  D: Affect is flat and depressed.  Mood is depressed.  Behavior is cooperative with encouragement, direction and support.  Interacts appropriately with peers and staff.  Participated in goals group, counselor lead group, and recreation.  Goal for today is to develop coping skills for the his past.   Also stated that he needs to let the past go, and that he needs to do the best he can in his treatment.   Reports fair sleep and good appetite.  Normal energy levels and good ability to pay attention.    A:  Medications per MD order.  Support given throughout day.  1:1 time spent with pt.  R:  Following treatment plan.  Denies HI/SI, auditory or visual hallucinations.  Contracts for safety.

## 2013-09-10 NOTE — Progress Notes (Signed)
Patient did attend the evening speaker AA meeting.  

## 2013-09-10 NOTE — Progress Notes (Signed)
D) Pt has been asleep since the start of the shift. Lying in his bed with even unlabored breathing A) Pt being monitored q 15 minutes throughout the night shift. R) Will continue to monitor.  

## 2013-09-10 NOTE — [facility] Group Notes (Signed)
[facility] LCSW Group Therapy  09/10/2013 10:00 AM  Type of Therapy:  Group Therapy  Participation Level:  Actively attentive, sharing when called upon  Participation Quality:  Appropriate  Affect:  Depressed and Flat  Cognitive:  Alert and Oriented  Insight:  Developing/Improving  Engagement in Therapy:  Developing/Improving  Modes of Intervention:  Clarification, Discussion, Exploration, Rapport Building, Socialization and Support  Summary of Progress/Problems: The main focus of today's process group was for the patient to identify ways in which they have in the past sabotaged their own recovery. Motivational Interviewing was utilized to ask the group members what they get out of their substance use, and what reasons they may have for wanting to change. The Stages of Change were explained using a handout, and patients identified where they currently are with regard to stages of change. Quintez identified his envoronment as major self sabotaging behavior and states it will be difficult to change yet he attempt.  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 Pt feels he is in action phase where he will need to make deliberate changes upon discharge thus advocating for further inpatient treatment for himself.   Clide Dales 09/10/2013

## 2013-09-11 DIAGNOSIS — F200 Paranoid schizophrenia: Secondary | ICD-10-CM

## 2013-09-11 DIAGNOSIS — F1420 Cocaine dependence, uncomplicated: Secondary | ICD-10-CM

## 2013-09-11 DIAGNOSIS — F1994 Other psychoactive substance use, unspecified with psychoactive substance-induced mood disorder: Secondary | ICD-10-CM

## 2013-09-11 DIAGNOSIS — F1220 Cannabis dependence, uncomplicated: Secondary | ICD-10-CM

## 2013-09-11 MED ORDER — NICOTINE POLACRILEX 2 MG MT GUM
2.0000 mg | CHEWING_GUM | OROMUCOSAL | Status: DC | PRN
  Administered 2013-09-11: 2 mg via ORAL

## 2013-09-11 MED ORDER — TRAZODONE HCL 100 MG PO TABS
100.0000 mg | ORAL_TABLET | Freq: Every evening | ORAL | Status: DC | PRN
  Administered 2013-09-11: 100 mg via ORAL
  Filled 2013-09-11 (×3): qty 1
  Filled 2013-09-11 (×2): qty 28
  Filled 2013-09-11: qty 1

## 2013-09-11 NOTE — [facility] Group Notes (Signed)
[facility] Group Notes:  (Nursing/MHT/Case Management/Adjunct)  Date:  09/11/2013  Time:  11:36 AM  Type of Therapy:  Psychoeducational Skills  Participation Level:  Active  Participation Quality:  Sharing and Supportive  Affect:  Flat  Cognitive:  Oriented  Insight:  Improving  Engagement in Group:  Engaged and Supportive  Modes of Intervention:  Clarification, Discussion, Education, Exploration, Orientation, Problem-solving, Socialization and Support  Summary of Progress/Problems:  Pt stated that he was tired today during group.  Focus is on identifying what choices he has made in the past and what he needs to change to be successful post discharge.  Inda Merlin 09/11/2013, 11:36 AM

## 2013-09-11 NOTE — Progress Notes (Signed)
[facility] Group Notes:  (Nursing/MHT/Case Management/Adjunct)  Date:  09/11/2013  Time:  3:40 PM  Type of Therapy:  Psychoeducational Skills  Participation Level:  Did Not Attend  Summary of Progress/Problems: Pt was in bed asleep.   Caswell Corwin 09/11/2013, 3:40 PM

## 2013-09-11 NOTE — [facility] Group Notes (Signed)
[facility] Group Notes:  (Nursing/MHT/Case Management/Adjunct)  Date:  09/11/2013  Time:  3:11 PM  Type of Therapy:  Psychoeducational Skills  Participation Level:  Minimal  Participation Quality:  Attentive  Affect:  Flat  Cognitive:  Oriented  Insight:  Improving  Engagement in Group:  Limited  Modes of Intervention:  Education and Support  Summary of Progress/Problems: Pt watched educational video on how attitude effects behavior.  Video was discussed as group and what was learned.    Antonio Alvarez 09/11/2013, 3:11 PM

## 2013-09-11 NOTE — Progress Notes (Signed)
09-11-13  NSG NOTE  7a-7p  D: Affect is flat.  Mood is depressed.  Behavior is cooperative with encouragement, direction and support.  Interacts appropriately with peers and staff.  Participated in goals group, counselor lead group, and recreation.  Goal for today is to identify the changes he needs to make to be successful post discharge.   Also stated that his sleep is fair and reports good appetite.  A:  Medications per MD order.  Support given throughout day.  1:1 time spent with pt.  R:  Following treatment plan.  Denies HI/SI, auditory or visual hallucinations.  Contracts for safety.

## 2013-09-11 NOTE — Progress Notes (Signed)
D Pt. Denies SI and HI. No complaints of pain or discomfort noted.   A Writer offered support and encouragement.  R Pt. Remains safe on the unit.

## 2013-09-11 NOTE — Progress Notes (Signed)
Patient ID: Antonio Alvarez, male   DOB: 05/05/1981, 32 y.o.   MRN: [id number] [facility] MD Progress Note  09/11/2013 3:35 PM Antonio Alvarez  MRN:  [id number]  Subjective:  Patient lying in bed after lunch on assessment, sleep not good--Trazodone increased.  Appetite is "alright", denies depression and withdrawal issues.  He forwards little information but pleasant and cooperative.   Diagnosis:   DSM5: Schizophrenia Disorders:  Schizophrenia (295.7) Obsessive-Compulsive Disorders:  NA Trauma-Stressor Disorders:  NA Substance/Addictive Disorders:  Cannabis Use Disorder - Severe (304.30) Depressive Disorders:  Substance induced mood disorder  Axis I: Cannabis Use Disorder - Severe (304.30), Paranoid schizophrenia, Cocaine dependence, Substance induced mood disorder Axis II: Deferred Axis III:  Past Medical History  Diagnosis Date  . Abscess     hx of abscess on neck  . Alcohol abuse   . Cellulitis   . Hyponatremia   . Leukocytosis   . Pancreatitis   . Depression    Axis IV: other psychosocial or environmental problems and polysubstance dependence Axis V: 41-50 serious symptoms  ADL's:  Fairly intact  Sleep: Fair  Appetite:  Fair  Suicidal Ideation:  Plan:  Denies Intent:  Denies Means:  Denies  Homicidal Ideation: "yes" Plan:  Denies Intent:  Denies Means:  Denies AEB (as evidenced by):  Psychiatric Specialty Exam: Review of Systems  Constitutional: Negative.   HENT: Negative.   Eyes: Negative.   Respiratory: Negative.   Cardiovascular: Negative.   Gastrointestinal: Negative.   Genitourinary: Negative.   Musculoskeletal: Positive for myalgias.  Skin: Negative.   Neurological: Positive for tremors.  Endo/Heme/Allergies: Negative.   Psychiatric/Behavioral: Positive for depression (Rated #4) and substance abuse (Alcoholism). Negative for suicidal ideas, hallucinations and memory loss. The patient is nervous/anxious and has insomnia.     Blood pressure 124/67, pulse  83, temperature 97.5 F (36.4 C), temperature source Oral, resp. rate 20, height 5\' 11"  (1.803 m), weight 62.37 kg (137 lb 8 oz), SpO2 97.00%.Body mass index is 19.19 kg/(m^2).  General Appearance: Fairly Groomed  Patent attorney::  Good  Speech:  Clear and Coherent  Volume:  Normal  Mood:  "I kind of, sort of depressed"  Affect:  Restricted  Thought Process:  Coherent and Intact  Orientation:  Full (Time, Place, and Person)  Thought Content:  Hallucinations: Auditory and Paranoid Ideation  Suicidal Thoughts:  No  Homicidal Thoughts:  Yes.  without intent/plan  Memory:  Immediate;   Good Recent;   Good Remote;   Fair  Judgement:  Impaired  Insight:  Lacking  Psychomotor Activity:  Tremor  Concentration:  Fair  Recall:  Fair  Akathisia:  No  Handed:  Right  AIMS (if indicated):     Assets:  Desire for Improvement  Sleep:  Number of Hours: 4.75   Current Medications: Current Facility-Administered Medications  Medication Dose Route Frequency Provider Last Rate Last Dose  . acetaminophen (TYLENOL) tablet 650 mg  650 mg Oral Q6H PRN Kerry Hough, PA-C      . alum & mag hydroxide-simeth (MAALOX/MYLANTA) 200-200-20 MG/5ML suspension 30 mL  30 mL Oral Q4H PRN Kerry Hough, PA-C      . benztropine (COGENTIN) tablet 1 mg  1 mg Oral Daily Sanjuana Kava, NP   1 mg at 09/11/13 7829  . haloperidol (HALDOL) tablet 0.5 mg  0.5 mg Oral BID PRN Sanjuana Kava, NP      . haloperidol (HALDOL) tablet 2 mg  2 mg Oral Q2000 Sanjuana Kava,  NP   2 mg at 09/10/13 2239  . magnesium hydroxide (MILK OF MAGNESIA) suspension 30 mL  30 mL Oral Daily PRN Kerry Hough, PA-C      . multivitamin with minerals tablet 1 tablet  1 tablet Oral Daily Kerry Hough, PA-C   1 tablet at 09/11/13 [phone]  . thiamine (VITAMIN B-1) tablet 100 mg  100 mg Oral Daily Kerry Hough, PA-C   100 mg at 09/11/13 9604  . traZODone (DESYREL) tablet 50 mg  50 mg Oral QHS,MR X 1 Spencer E Simon, PA-C   50 mg at 09/10/13 2240     Lab Results:  No results found for this or any previous visit (from the past 48 hour(s)).  Physical Findings: AIMS: Facial and Oral Movements Muscles of Facial Expression: None, normal Lips and Perioral Area: None, normal Jaw: None, normal Tongue: None, normal,Extremity Movements Upper (arms, wrists, hands, fingers): None, normal Lower (legs, knees, ankles, toes): None, normal, Trunk Movements Neck, shoulders, hips: None, normal, Overall Severity Severity of abnormal movements (highest score from questions above): None, normal Incapacitation due to abnormal movements: None, normal Patient's awareness of abnormal movements (rate only patient's report): No Awareness, Dental Status Current problems with teeth and/or dentures?: No Does patient usually wear dentures?: No  CIWA:  CIWA-Ar Total: 0 COWS:  COWS Total Score: 2  Treatment Plan Summary: Daily contact with patient to assess and evaluate symptoms and progress in treatment Medication management  Plan:  Review of chart, vital signs, medications, and notes. 1-Individual and group therapy 2-Medication management for depression and anxiety:  Medications reviewed with the patient and no negative side effects, Trazodone increased 3-Coping skills for chronic mental illness and alcohol/drug dependency 4-Continue crisis stabilization and management 5-Address health issues--monitoring vital signs, stable 6-Treatment plan in progress to prevent relapse of alcohol abuse and anxiety  Medical Decision Making Problem Points:  Established problem, stable/improving (1), Review of last therapy session (1) and Review of psycho-social stressors (1) Data Points:  Review of medication regiment & side effects (2) Review of new medications or change in dosage (2)  I certify that inpatient services furnished can reasonably be expected to improve the patient's condition.   Nanine Means, PMHNP 09/11/2013, 3:35 PM  I agreed with the findings,  treatment and disposition plan of this patient. Kathryne Sharper, MD

## 2013-09-11 NOTE — Progress Notes (Signed)
[facility] Group Notes:  (Nursing/MHT/Case Management/Adjunct)  Date:  09/11/2013  Time:  8:07 AM  Type of Therapy:  Psychoeducational Skills  Participation Level:  Active  Participation Quality:  Appropriate  Affect:  Appropriate  Cognitive:  Appropriate  Insight:  Appropriate  Engagement in Group:  Engaged and Supportive  Modes of Intervention:  Activity  Summary of Progress/Problems: Pts played a game of Pictionary using coping skills.  Ziad Maye C 09/11/2013, 8:07 AM 

## 2013-09-11 NOTE — [facility] Group Notes (Signed)
[facility] LCSW Group Therapy Note   09/11/2013/10:00 AM  Type of Therapy and Topic: Group Therapy: Feelings Around Returning Home & Establishing a Supportive Framework and Activity to Identify signs of Improvement or Decompensation   Participation Level: Adequate  Mood: Flat  Description of Group:  Patients first processed thoughts and feelings about up coming discharge. These included fears of upcoming changes, lack of change, new living environments, judgements and expectations from others and overall stigma of MH issues. We then discussed what is a supportive framework? What does it look like feel like and how do I discern it from and unhealthy non-supportive network? Learn how to cope when supports are not helpful and don't support you. Discuss what to do when your family/friends are not supportive.   Therapeutic Goals Addressed in Processing Group:  1. Patient will identify one healthy supportive network that they can use at discharge. 2. Patient will identify one factor of a supportive framework and how to tell it from an unhealthy network. 3. Patient able to identify one coping skill to use when they do not have positive supports from others. 4. Patient will demonstrate ability to communicate their needs through discussion and/or role plays.  Summary of Patient Progress:  Pt hesitant to engage during group session as other patients processed their anxiety about discharge and described healthy supports.  Patient did participate in activity and  chose a visual to represent improvement as art supplies and decompensation as cemetary. Patient reports he understands addiction can be fatal.   Carney Bern, LCSW

## 2013-09-11 NOTE — Progress Notes (Signed)
Patient ID: Antonio Alvarez, male   DOB: 1981-03-13, 32 y.o.   MRN: [id number] D)  Has been interacting appropriately with peers this evening, has been in the dayroom, attended group, states feeling a little better.  States he hears voices, and has for a long time,states the voices aren't threatening or suggestive, able to tune them out.  Stated he had taken adderall for a brief time, it had helped him focus, asking about restarting it, encouraged him to discuss it with MD.  Has been pleasant, cooperative, compliant, participating in the milieu. A)  Will continue to monitor for safety, continue POC R)  Safety maintained.

## 2013-09-11 NOTE — Progress Notes (Signed)
Patient did attend the evening speaker AA meeting.  

## 2013-09-12 DIAGNOSIS — F1910 Other psychoactive substance abuse, uncomplicated: Secondary | ICD-10-CM

## 2013-09-12 DIAGNOSIS — F411 Generalized anxiety disorder: Secondary | ICD-10-CM

## 2013-09-12 DIAGNOSIS — F332 Major depressive disorder, recurrent severe without psychotic features: Principal | ICD-10-CM

## 2013-09-12 MED ORDER — TRAZODONE HCL 100 MG PO TABS: 100.0000 mg | ORAL_TABLET | Freq: Every evening | ORAL | Status: DC | PRN

## 2013-09-12 MED ORDER — HALOPERIDOL 0.5 MG PO TABS
0.5000 mg | ORAL_TABLET | Freq: Two times a day (BID) | ORAL | Status: DC | PRN
Start: 2013-09-12 — End: 2014-02-13

## 2013-09-12 MED ORDER — HALOPERIDOL 2 MG PO TABS: 2.0000 mg | ORAL_TABLET | Freq: Every day | ORAL | Status: DC

## 2013-09-12 MED ORDER — BENZTROPINE MESYLATE 1 MG PO TABS: 1.0000 mg | ORAL_TABLET | Freq: Every day | ORAL | Status: DC

## 2013-09-12 NOTE — [facility] Group Notes (Signed)
[facility] LCSW Aftercare Discharge Planning Group Note   09/12/2013 12:27 PM  Participation Quality:  Appropriate   Mood/Affect:  Appropriate  Depression Rating:  1  Anxiety Rating:  0  Thoughts of Suicide:  No Will you contract for safety?   NA  Current AVH:  No-pt states that he always hears voices but is not reporting any this morning.   Plan for Discharge/Comments:  Pt not accepted into [facility] due to AVH. Pt to d/c and will follow up at [facility] for med management. He plans to return to shelter or stay with family. Bus pass given to pt.   Transportation Means: bus   Supports: none identified by pt.   Smart, HeatherLCSWA

## 2013-09-12 NOTE — Progress Notes (Signed)
[facility] Adult Case Management Discharge Plan :  Will you be returning to the same living situation after discharge: Yes,  friend's home. At discharge, do you have transportation home?:Yes,  bus pass provided. Do you have the ability to pay for your medications:Yes,  mental health  Release of information consent forms completed and in the chart;  Patient's signature needed at discharge.  Patient to Follow up at: Follow-up Information   Follow up with [facility]. (Walk in between 8am-9am Monday through Friday for hospital followup/medication management/assessment for therapy services. )    Contact information:   [address], [place] [address] Phone: [phone] Fax: [phone]      Patient denies SI/HI:   Yes,  during group/self report.    Safety Planning and Suicide Prevention discussed:  Yes,  PT refused to consent to SPE with family member. SPI pamphlet provided to pt and he was encouraged to share information with support network, ask questions, and talk about any concerns relating to SPE.  Smart, HeatherLCSWA 09/12/2013, 2:09 PM

## 2013-09-12 NOTE — Progress Notes (Signed)
Adult Psychoeducational Group Note  Date:  09/12/2013 Time:  2:12 PM  Group Topic/Focus:  Self Care:   The focus of this group is to help patients understand the importance of self-care in order to improve or restore emotional, physical, spiritual, interpersonal, and financial health.  Participation Level:  Active  Participation Quality:  Appropriate  Affect:  Appropriate  Cognitive:  Alert  Insight: Appropriate  Engagement in Group:  Engaged  Modes of Intervention:  Activity and Discussion  Additional Comments:   Patient engaged in group self assessment activity out of the workbook. Patient shared responses from each question that was discussed during group. Group discussion was based on healthy choices and self care.   Elvera Bicker 09/12/2013, 2:12 PM

## 2013-09-12 NOTE — [facility] Group Notes (Signed)
[facility] LCSW Group Therapy  09/12/2013 2:46 PM  Type of Therapy:  Group Therapy  Participation Level:  Active  Participation Quality:  Attentive  Affect:  Appropriate  Cognitive:  Alert and Oriented  Insight:  Engaged  Engagement in Therapy:  Engaged  Modes of Intervention:  Confrontation, Discussion, Education, Exploration, Socialization and Support  Summary of Progress/Problems: Today's Topic: Overcoming Obstacles. Pt identified obstacles faced currently and processed barriers involved in overcoming these obstacles. Pt identified steps necessary for overcoming these obstacles and explored motivation (internal and external) for facing these difficulties head on. Pt further identified one area of concern in their lives and chose a skill of focus pulled from their "toolbox." Antonio Alvarez was engaged and attentive throughout today's therapy group. He stated that his biggest obstacle involves "just staying away from alcohol." He reported that he enjoys spending time in "the art scene" and stated that drugs and alcohol are available and pushed on him. Pat shows progress in the group setting and improving insight AEB his ability to identify triggers/obstacles to maintaining sobriety and identify supports (my bosses and some friends) that he can utilize to help him remain sober. Antonio Alvarez was able to process how alcohol affects his mental health and his daily life in a negative way.    Smart, HeatherLCSWA  09/12/2013, 2:46 PM

## 2013-09-12 NOTE — [facility] Suicide Risk Assessment (Signed)
Suicide Risk Assessment  Discharge Assessment     Demographic Factors:  Male  Mental Status Per Nursing Assessment::   On Admission:  Self-harm thoughts  Current Mental Status by Physician: In full contact with reality. Denies suicidal ideas, plans or intent. His mood is worried, affect is appropriate. There are no active S/S of withdrawal. He minimizes the nature of the voices he hears. States they are inside his head and they mainly make comments. They have never told him to hurt himself or others, and they have never interfered with his functioning. He is still going to try to get to [facility]. States he is afraid of relapse as he does not want to have a flare up of his pancreatitis   Loss Factors: NA  Historical Factors: NA  Risk Reduction Factors:   Positive social support  Continued Clinical Symptoms:  Alcohol/Substance Abuse/Dependencies  Cognitive Features That Contribute To Risk:  Closed-mindedness Polarized thinking Thought constriction (tunnel vision)    Suicide Risk:  Minimal: No identifiable suicidal ideation.  Patients presenting with no risk factors but with morbid ruminations; may be classified as minimal risk based on the severity of the depressive symptoms  Discharge Diagnoses:   AXIS I:  Cocaine Dependence, Cannabis dependence, Alcohol Abuse, Psychotic Disorder NOS AXIS II:  Deferred AXIS III:   Past Medical History  Diagnosis Date  . Abscess     hx of abscess on neck  . Alcohol abuse   . Cellulitis   . Hyponatremia   . Leukocytosis   . Pancreatitis   . Depression    AXIS IV:  other psychosocial or environmental problems AXIS V:  61-70 mild symptoms  Plan Of Care/Follow-up recommendations:  Activity:  as tolerated Diet:  regular Follow up [facility] Is patient on multiple antipsychotic therapies at discharge:  No   Has Patient had three or more failed trials of antipsychotic monotherapy by history:  No  Recommended Plan for Multiple  Antipsychotic Therapies: NA  Meeka Cartelli A 09/12/2013, 2:17 PM

## 2013-09-12 NOTE — [facility] Suicide Risk Assessment (Signed)
[facility] INPATIENT:  Family/Significant Other Suicide Prevention Education  Suicide Prevention Education:  Patient Refusal for Family/Significant Other Suicide Prevention Education: The patient Antonio Alvarez has refused to provide written consent for family/significant other to be provided Family/Significant Other Suicide Prevention Education during admission and/or prior to discharge.  Physician notified.  SPE completed with pt as he did not consent to family contact. SPI pamphlet provided to pt and he was encouraged to share information with support network, ask questions, and talk about any concerns.   Smart, HeatherLCSWA  09/12/2013, 2:08 PM

## 2013-09-12 NOTE — Progress Notes (Signed)
Patient ID: [name], male   DOB: 1981/05/27, 32 y.o.   MRN: [id number]  D: Patient showing no withdrawal symptoms at present. No SI at this time.  A: Patient obtained all belongings, prescriptions, medication samples, and follow up appointment. R: Given bus pass by Child psychotherapist.

## 2013-09-12 NOTE — Discharge Summary (Signed)
Physician Discharge Summary Note  Patient:  Antonio Alvarez is an 32 y.o., male MRN:  [id number] DOB:  10-17-1980 Patient phone:  [phone] (home)  Patient address:   [address] [place] [address],   Date of Admission:  09/08/2013 Date of Discharge: 09/12/2013  Reason for Admission:  Depression with suicidal ideations  Discharge Diagnoses: Active Problems:   Substance induced mood disorder   Cocaine dependence   Cannabis dependence   Paranoid schizophrenia  Review of Systems  Constitutional: Negative.   HENT: Negative.   Eyes: Negative.   Respiratory: Negative.   Cardiovascular: Negative.   Gastrointestinal: Negative.   Genitourinary: Negative.   Musculoskeletal: Negative.   Skin: Negative.   Neurological: Negative.   Endo/Heme/Allergies: Negative.   Psychiatric/Behavioral: Positive for substance abuse.   DSM5: Substance/Addictive Disorders:  Alcohol Related Disorder - Severe (303.90), Alcohol Intoxication with Use Disorder - Severe (F10.229), Cannabis Use Disorder - Severe (304.30) and Opioid Disorder - Severe (304.00) Depressive Disorders:  Major Depressive Disorder - Severe (296.23)  Axis Diagnosis:   AXIS I:  Alcohol Abuse, Anxiety Disorder NOS, Major Depression, Recurrent severe, Substance Abuse and Substance Induced Mood Disorder AXIS II:  Deferred AXIS III:   Past Medical History  Diagnosis Date  . Abscess     hx of abscess on neck  . Alcohol abuse   . Cellulitis   . Hyponatremia   . Leukocytosis   . Pancreatitis   . Depression    AXIS IV:  other psychosocial or environmental problems, problems related to social environment and problems with primary support group AXIS V:  61-70 mild symptoms  Level of Care:  [facility]  Hospital Course:  On admission:  32 year old African-American male. Admitted to [facility] from [facility] ED with complaints of suicidal ideations, depression and drug use requesting treatment. Patient reports, "I went to the  [facility] yesterday. I felt like my life was in danger because I felt like committing suicide. I kind of, sort of committed suicide, but not really. I have been depressed for a while because everything about my life is going wrong. I use drugs to deal with issues that are not so pleasant in my life. I have been using drugs all my life. But, I started using heavily about 2 weeks ago. Drugs help me forget bad stuff. I don't feel any emotional or physical pains when I use drugs. My longest sobriety is 1 week and I don't remember how long it has been. I use cocaine, THC and marijuana. I need to get to a point where I won't have to drink or use drugs no more. My pancrease is in trouble".  During hospitalization:  Medications managed--PRN medications for withdrawal symptoms.  Cogentin 1 mg daily to prevent EPS from haldol, Haldol 0.5 mg BID psychosis PRN, Haldol 2 mg daily for psychosis, and Trazodone 100 mg for sleep at bedtime started.  [place] attended and participated in therapy.  Patient denied suicidal/homicidal ideations and auditory/visual hallucinations, follow-up appointments encouraged to attend, outside support groups encouraged and information given, Rx given with 14 day supply of medications.  Antonio Alvarez is mentally and physically stable.  Consults:  None  Significant Diagnostic Studies:  labs: completed, reviewed, stable  Discharge Vitals:   Blood pressure 97/61, pulse 82, temperature 97.8 F (36.6 C), temperature source Oral, resp. rate 16, height 5\' 11"  (1.803 m), weight 62.37 kg (137 lb 8 oz), SpO2 97.00%. Body mass index is 19.19 kg/(m^2). Lab Results:  No results found for this or any previous visit (from the past 72 hour(s)).  Physical Findings: AIMS: Facial and Oral Movements Muscles of Facial Expression: None, normal Lips and Perioral Area: None, normal Jaw: None, normal Tongue: None, normal,Extremity Movements Upper (arms, wrists, hands, fingers): None, normal Lower  (legs, knees, ankles, toes): None, normal, Trunk Movements Neck, shoulders, hips: None, normal, Overall Severity Severity of abnormal movements (highest score from questions above): None, normal Incapacitation due to abnormal movements: None, normal Patient's awareness of abnormal movements (rate only patient's report): No Awareness, Dental Status Current problems with teeth and/or dentures?: No Does patient usually wear dentures?: No  CIWA:  CIWA-Ar Total: 0 COWS:  COWS Total Score: 2  Psychiatric Specialty Exam: See Psychiatric Specialty Exam and Suicide Risk Assessment completed by Attending Physician prior to discharge.  Discharge destination:  Home  Is patient on multiple antipsychotic therapies at discharge:  No   Has Patient had three or more failed trials of antipsychotic monotherapy by history:  No  Recommended Plan for Multiple Antipsychotic Therapies: NA  Discharge Orders   Future Orders Complete By Expires   Diet - low sodium heart healthy  As directed    Increase activity slowly  As directed        Medication List       Indication   benztropine 1 MG tablet  Commonly known as:  COGENTIN  Take 1 tablet (1 mg total) by mouth daily.   Indication:  Extrapyramidal Reaction caused by Medications     haloperidol 0.5 MG tablet  Commonly known as:  HALDOL  Take 1 tablet (0.5 mg total) by mouth 2 (two) times daily as needed for agitation.   Indication:  Schizophrenia, Anxiety     haloperidol 2 MG tablet  Commonly known as:  HALDOL  Take 1 tablet (2 mg total) by mouth daily at 8 pm.   Indication:  Schizophrenia     traZODone 100 MG tablet  Commonly known as:  DESYREL  Take 1 tablet (100 mg total) by mouth at bedtime and may repeat dose one time if needed.   Indication:  Trouble Sleeping           Follow-up Information   Follow up with [facility]. (Walk in between 8am-9am Monday through Friday for hospital followup/medication management/assessment for therapy  services. )    Contact information:   [address] [place] [address] Phone: [phone] Fax: [phone]      Follow-up recommendations:  Activity:  as tolerated Diet:  low-sodium heart healthy diet Continue to work your relapse prevention plan Comments:   Take all your medications as prescribed by your mental healthcare provider. Report any adverse effects and or reactions from your medicines to your outpatient provider promptly. Patient is instructed and cautioned to not engage in alcohol and or illegal drug use while on prescription medicines. In the event of worsening symptoms, patient is instructed to call the crisis hotline, 911 and or go to the nearest ED for appropriate evaluation and treatment of symptoms. Follow-up with your primary care provider for your other medical issues, concerns and or health care needs.  Total Discharge Time:  Greater than 30 minutes.  SignedNanine Means, PMH-NP 09/12/2013, 2:56 PM Agree with assessment and plan Madie Reno A. [place], [email or web address].D

## 2013-09-12 NOTE — Progress Notes (Addendum)
D:  Per pt self inventory pt reports sleeping well, appetite good, energy level normal, ability to pay attention good, rates depression at a 0 out of 10 and hopelessness at a 2 out of 10, denies SI/HI, still endorses some AH "gibberish".     A:  Emotional support provided, Encouraged pt to continue with treatment plan and attend all group activities, q15 min checks maintained for safety.  R:  Pt is calm cooperative, pleasant with staff and other patients

## 2013-09-12 NOTE — Clinical Social Work Note (Signed)
Pt not accepted into [facility] due to active AH. Pt requesting to d/c today from [facility]. He plans to follow up at [facility], return to work, and live with his friend in [place]. Pt provided contact information to [facility] if he decides to pursue inpatient treatment in the future. Pt provided with bus pass.   [employer], LCSWA  09/12/2013 2:24 PM

## 2013-09-14 ENCOUNTER — Encounter (HOSPITAL_COMMUNITY): Payer: Self-pay | Admitting: Emergency Medicine

## 2013-09-14 ENCOUNTER — Emergency Department (HOSPITAL_COMMUNITY)
Admission: EM | Admit: 2013-09-14 | Discharge: 2013-09-15 | Disposition: A | Discharge status: Home / Self Care | Payer: Self-pay | Attending: Emergency Medicine | Admitting: Emergency Medicine

## 2013-09-14 DIAGNOSIS — F17200 Nicotine dependence, unspecified, uncomplicated: Secondary | ICD-10-CM | POA: Insufficient documentation

## 2013-09-14 DIAGNOSIS — Z8639 Personal history of other endocrine, nutritional and metabolic disease: Secondary | ICD-10-CM | POA: Insufficient documentation

## 2013-09-14 DIAGNOSIS — Z872 Personal history of diseases of the skin and subcutaneous tissue: Secondary | ICD-10-CM | POA: Insufficient documentation

## 2013-09-14 DIAGNOSIS — F3289 Other specified depressive episodes: Secondary | ICD-10-CM | POA: Insufficient documentation

## 2013-09-14 DIAGNOSIS — Z79899 Other long term (current) drug therapy: Secondary | ICD-10-CM | POA: Insufficient documentation

## 2013-09-14 DIAGNOSIS — F1210 Cannabis abuse, uncomplicated: Secondary | ICD-10-CM | POA: Insufficient documentation

## 2013-09-14 DIAGNOSIS — F329 Major depressive disorder, single episode, unspecified: Secondary | ICD-10-CM | POA: Insufficient documentation

## 2013-09-14 DIAGNOSIS — F1310 Sedative, hypnotic or anxiolytic abuse, uncomplicated: Secondary | ICD-10-CM | POA: Insufficient documentation

## 2013-09-14 DIAGNOSIS — F10929 Alcohol use, unspecified with intoxication, unspecified: Secondary | ICD-10-CM

## 2013-09-14 DIAGNOSIS — F1010 Alcohol abuse, uncomplicated: Secondary | ICD-10-CM | POA: Insufficient documentation

## 2013-09-14 DIAGNOSIS — Z862 Personal history of diseases of the blood and blood-forming organs and certain disorders involving the immune mechanism: Secondary | ICD-10-CM | POA: Insufficient documentation

## 2013-09-14 DIAGNOSIS — Z8719 Personal history of other diseases of the digestive system: Secondary | ICD-10-CM | POA: Insufficient documentation

## 2013-09-14 NOTE — Progress Notes (Signed)
Patient Discharge Instructions:  After Visit Summary (AVS):   Faxed to:  09/14/13 Discharge Summary Note:   Faxed to:  09/14/13 Psychiatric Admission Assessment Note:   Faxed to:  09/14/13 Suicide Risk Assessment - Discharge Assessment:   Faxed to:  09/14/13 Faxed/Sent to the Next Level Care provider:  09/14/13 Faxed to [facility] @ [phone]  Jerelene Redden, 09/14/2013, 3:38 PM

## 2013-09-14 NOTE — ED Notes (Signed)
Pt states he is here for detox from etoh and marijuana. Both used today. Pt denies SI/HI

## 2013-09-15 LAB — COMPREHENSIVE METABOLIC PANEL
ALT: 29 U/L (ref 0–53)
AST: 28 U/L (ref 0–37)
Albumin: 4.5 g/dL (ref 3.5–5.2)
Alkaline Phosphatase: 131 U/L — ABNORMAL HIGH (ref 39–117)
BUN: 9 mg/dL (ref 6–23)
CO2: 29 mEq/L (ref 19–32)
Calcium: 9.6 mg/dL (ref 8.4–10.5)
Chloride: 99 mEq/L (ref 96–112)
Creatinine, Ser: 0.84 mg/dL (ref 0.50–1.35)
GFR calc Af Amer: >90 mL/min (ref >90)
GFR calc non Af Amer: >90 mL/min (ref >90)
Glucose, Bld: 94 mg/dL (ref 70–99)
Potassium: 4 mEq/L (ref 3.7–5.3)
Sodium: 141 mEq/L (ref 137–147)
Total Bilirubin: 0.3 mg/dL (ref 0.3–1.2)
Total Protein: 8.8 g/dL — ABNORMAL HIGH (ref 6.0–8.3)

## 2013-09-15 LAB — CBC
HCT: 43.7 % (ref 39.0–52.0)
Hemoglobin: 14.8 g/dL (ref 13.0–17.0)
MCH: 29.6 pg (ref 26.0–34.0)
MCHC: 33.9 g/dL (ref 30.0–36.0)
MCV: 87.4 fL (ref 78.0–100.0)
Platelets: 366 10*3/uL (ref 150–400)
RBC: 5 MIL/uL (ref 4.22–5.81)
RDW: 14.5 % (ref 11.5–15.5)
WBC: 8.8 10*3/uL (ref 4.0–10.5)

## 2013-09-15 LAB — RAPID URINE DRUG SCREEN, HOSP PERFORMED
Amphetamines: NOT DETECTED
Barbiturates: NOT DETECTED
Benzodiazepines: POSITIVE — AB
Cocaine: NOT DETECTED
Opiates: NOT DETECTED
Tetrahydrocannabinol: POSITIVE — AB

## 2013-09-15 LAB — SALICYLATE LEVEL: Salicylate Lvl: <2 mg/dL — ABNORMAL LOW (ref 2.8–20.0)

## 2013-09-15 LAB — ACETAMINOPHEN LEVEL: Acetaminophen (Tylenol), Serum: <15 ug/mL (ref 10–30)

## 2013-09-15 LAB — ETHANOL: Alcohol, Ethyl (B): 203 mg/dL — ABNORMAL HIGH (ref 0–11)

## 2013-09-15 MED ORDER — LORAZEPAM 1 MG PO TABS: 0.0000 mg | ORAL_TABLET | Freq: Two times a day (BID) | ORAL | Status: DC

## 2013-09-15 MED ORDER — NICOTINE 21 MG/24HR TD PT24
21.0000 mg | MEDICATED_PATCH | Freq: Every day | TRANSDERMAL | Status: DC
  Filled 2013-09-15: qty 1

## 2013-09-15 MED ORDER — ALUM & MAG HYDROXIDE-SIMETH 200-200-20 MG/5ML PO SUSP: 30.0000 mL | ORAL | Status: DC | PRN

## 2013-09-15 MED ORDER — LORAZEPAM 1 MG PO TABS
0.0000 mg | ORAL_TABLET | Freq: Four times a day (QID) | ORAL | Status: DC
  Administered 2013-09-15: 2 mg via ORAL
  Filled 2013-09-15: qty 2

## 2013-09-15 MED ORDER — ONDANSETRON HCL 4 MG PO TABS: 4.0000 mg | ORAL_TABLET | Freq: Three times a day (TID) | ORAL | Status: DC | PRN

## 2013-09-15 MED ORDER — IBUPROFEN 200 MG PO TABS
600.0000 mg | ORAL_TABLET | Freq: Three times a day (TID) | ORAL | Status: DC | PRN
  Administered 2013-09-15: 600 mg via ORAL
  Filled 2013-09-15: qty 3

## 2013-09-15 MED ORDER — THIAMINE HCL 100 MG/ML IJ SOLN: 100.0000 mg | Freq: Every day | INTRAMUSCULAR | Status: DC

## 2013-09-15 MED ORDER — VITAMIN B-1 100 MG PO TABS: 100.0000 mg | ORAL_TABLET | Freq: Every day | ORAL | Status: DC

## 2013-09-15 NOTE — Progress Notes (Addendum)
Writer consulted with Dr. Preston FleetingGlick regarding the patient denying SI, HI and psychosis.  [facility].     Per, Dr. Preston FleetingGlick the patient will be discharged with resources for outpatient services. Writer has informed the nurse working with the patient.

## 2013-09-15 NOTE — ED Notes (Signed)
Patient denies SI, HI, AVH. Contracts for safety. Patient reports feelings of hopelessness, anxiety 20/10 and depression 11/10. Reports that he has a lot on his mind, "a lot of stuff that I need to do that I ain't been doing". Patient appears mildly anxious, calm, cooperative. [facility] Reports that [facility], he went to check on a friend and after this visit has since decided that he needs to take care of himself.  Given Motrin and Ativan. Encouragement offered.  Patient safety maintained, Q 15 checks in place.

## 2013-09-15 NOTE — Progress Notes (Addendum)
Writer attempted to assess the patient but unsuccessful due to the patient being given 2 Ativan, having a BAL of 203 and positive UDS for Benzodiazepine and Marijuana.  Writer will attempt to assess the patient again when the nurse completes vitals on the patient at 5:30am  Writer [facility] and the ER MD (Dr. Preston FleetingGlick).

## 2013-09-15 NOTE — Discharge Instructions (Signed)
Finding Treatment for Alcohol and Drug Addiction °It can be hard to find the right place to get professional treatment. Here are some important things to consider: °· There are different types of treatment to choose from. °· Some programs are live-in (residential) while others are not (outpatient). Sometimes a combination is offered. °· No single type of program is right for everyone. °· Most treatment programs involve a combination of education, counseling, and a 12-step, spiritually-based approach. °· There are non-spiritually based programs (not 12-step). °· Some treatment programs are government sponsored. They are geared for patients without private insurance. °· Treatment programs can vary in many respects such as: °· Cost and types of insurance accepted. °· Types of on-site medical services offered. °· Length of stay, setting, and size. °· Overall philosophy of treatment.  °A person may need specialized treatment or have needs not addressed by all programs. For example, adolescents need treatment appropriate for their age. Other people have secondary disorders that must be managed as well. Secondary conditions can include mental illness, such as depression or diabetes. Often, a period of detoxification from alcohol or drugs is needed. This requires medical supervision and not all programs offer this. °THINGS TO CONSIDER WHEN SELECTING A TREATMENT PROGRAM  °· Is the program certified by the appropriate government agency? Even private programs must be certified and employ certified professionals. °· Does the program accept your insurance? If not, can a payment plan be set up? °· Is the facility clean, organized, and well run? Do they allow you to speak with graduates who can share their treatment experience with you? Can you tour the facility? Can you meet with staff? °· Does the program meet the full range of individual needs? °· Does the treatment program address sexual orientation and physical disabilities?  Do they provide age, gender, and culturally appropriate treatment services? °· Is treatment available in languages other than English? °· Is long-term aftercare support or guidance encouraged and provided? °· Is assessment of an individual's treatment plan ongoing to ensure it meets changing needs? °· Does the program use strategies to encourage reluctant patients to remain in treatment long enough to increase the likelihood of success? °· Does the program offer counseling (individual or group) and other behavioral therapies? °· Does the program offer medicine as part of the treatment regimen, if needed? °· Is there ongoing monitoring of possible relapse? Is there a defined relapse prevention program? Are services or referrals offered to family members to ensure they understand addiction and the recovery process? This would help them support the recovering individual. °· Are 12-step meetings held at the center or is transport available for patients to attend outside meetings? °In countries outside of the [place], see local directories for contact information for services in your area. °Document Released: 07/31/2005 Document Revised: 11/24/2011 Document Reviewed: 02/10/2008 °ExitCare® Patient Information ©2014 [employer]. ° ° °Emergency Department Resource Guide °1) Find a Doctor and Pay Out of Pocket °Although you won't have to find out who is covered by your insurance plan, it is a good idea to ask around and get recommendations. You will then need to call the office and see if the doctor you have chosen will accept you as a new patient and what types of options they offer for patients who are self-pay. Some doctors offer discounts or will set up payment plans for their patients who do not have insurance, but you will need to ask so you aren't surprised when you get to your appointment. ° °  2) Contact Your Local Health Department °Not all health departments have doctors that can see patients for sick  visits, but many do, so it is worth a call to see if yours does. If you don't know where your local health department is, you can check in your phone book. The CDC also has a tool to help you locate your state's health department, and many state websites also have listings of all of their local health departments. ° °3) Find a Walk-in Clinic °If your illness is not likely to be very severe or complicated, you may want to try a walk in clinic. These are popping up all over the country in pharmacies, drugstores, and shopping centers. They're usually staffed by nurse practitioners or physician assistants that have been trained to treat common illnesses and complaints. They're usually fairly quick and inexpensive. However, if you have serious medical issues or chronic medical problems, these are probably not your best option. ° °No Primary Care Doctor: °- Call Health Connect at  832-8000 - they can help you locate a primary care doctor that  accepts your insurance, provides certain services, etc. °- Physician Referral Service- 1-800-533-3463 ° °Chronic Pain Problems: °Organization         Address  Phone   Notes  °[facility]  (336) 297-2271 Patients need to be referred by their primary care doctor.  ° °Medication Assistance: °Organization         Address  Phone   Notes  °[place] [facility] [address] °[place], [place] [address] (336) 641-8030 --Must be a resident of [place] °-- Must have NO insurance coverage whatsoever (no Medicaid/ Medicare, etc.) °-- The pt. MUST have a primary care doctor that directs their care regularly and follows them in the community °  °[facility]  (866) 331-1348   °[employer]  (888) 892-1162   ° °Agencies that provide inexpensive medical care: °Organization         Address  Phone   Notes  °[facility]  (336) 832-8035   °[facility]    (336) 832-7272   °[facility] [address] °[place], [place] [address] (336) 832-4777   °[facility] [address], °[place] (336) 271-4999   °[facility]    (336) 373-0678   °[facility]    (336) 272-1050   °[facility] ° [address], [place] Phone:  (336) 832-4444, Fax:  (336) 832-4440 Hours of Operation:  9 am - 6 pm, M-F.  Also accepts Medicaid/Medicare and self-pay.  °[facility] ° [address], [place] Phone: (336) 832-3150, Fax: (336) [phone]. Hours of Operation:  8:30 am - 5:30 pm, M-F.  Also accepts Medicaid and self-pay.  °[facility] [place] [address], [place] Phone: (336) 878-6027   °[facility] [address], [place], [place] (336)723-1848, Ext. 123 Mondays & Thursdays: 7-9 AM.  First 15 patients are seen on a first come, first serve basis. °  ° °Medicaid-accepting [place] Providers: ° °Organization         Address  Phone   Notes  °[facility] [address],  (336) 641-2100 Also accepts self-pay patients.  °[facility] [address],  ° (336) 856-9996   °[facility] [address], [place] (336) 288-8857   °[facility] [address] [place] [address], [place] (336) 299-7000   °Veita Bland [address], [place]  ° (336) 373-1557 Only accepts [place] Access Medicaid patients after they have their name applied to their card.  ° °Self-Pay (no insurance) in [place]: ° °Organization         Address  Phone   Notes  °Sickle Cell Patients, [facility] [address], [place] (336) 832-1970   °[facility] [address], [place] (336) 832-4400   °[facility] ° [address], [place] (336) 992-4800   °[facility]/Dr. Osei-Bonsu ° [address] [place] [address],  [place] or [address], [place] (336) 841-8500 Phone number for both [place] and [place] locations is the same.  °[facility] [address], [place] (336) 299-0000   °[facility] [address] [place] [address], [place] or [address] (336) 852-7530 °(336) 878-2260   °[facility] [address], [place] (336) 350-1642, phone; (336) 294-5005, fax Sees patients 1st and 3rd Saturday of every month.  Must not qualify for public or private insurance (i.e. Medicaid, Medicare, [place] Health Choice, Veterans' Benefits) • Household income should be no more than 200% of the poverty level •The clinic cannot treat you if you are pregnant or think you are pregnant • Sexually transmitted diseases are not treated at the clinic.  ° ° °Dental Care: °Organization         Address  Phone  Notes  °[facility] [address], [place] (336) 641-6152 Accepts children up to age 21 who are enrolled in Medicaid or [place] Health Choice; pregnant women with a Medicaid card; and children who have applied for Medicaid or [place] Health Choice, but were declined, whose parents can pay a reduced fee at time of service.  °[facility] [place]  [address], [place] (336) 641-7733 Accepts children up to age 21 who are enrolled in Medicaid or [place] Health Choice; pregnant women with a Medicaid card; and children who have applied for Medicaid or  Health Choice, but were declined, whose parents can pay a reduced fee at time of service.  °[facility] ° [address],  (336) 641-4533 Patients are seen by appointment only. Walk-ins are not accepted. [facility] will see patients 18 years of age and older. °Monday - Tuesday (8am-5pm) °Most Wednesdays (8:30-5pm) °$30 per visit, cash only  °[facility] ° [address], [place] (336) 641-4533 Patients are seen by appointment only. Walk-ins are not accepted. [facility] will see patients 18 years of age and older. °One Wednesday Evening (Monthly: Volunteer Based).  $30 per visit, cash only  °[facility]  (919) 537-3737 for adults; Children under age 4, call Graduate Pediatric Dentistry at ([phone]) [phone]. Children aged 4-14, please call (919) [phone] to request a pediatric application. ° Dental services are provided in all areas of dental care including fillings, crowns and bridges, complete and partial dentures, implants, gum treatment, root canals, and extractions. Preventive care is also provided. Treatment is provided to both adults and children. °Patients are selected via a lottery and there is often a waiting list. °  °[facility] [address], °  [place] ° (336) 763-8833 [email or web address] °  °[facility] [address], [place], [place] (336)723-1848, Ext. 123 Second and Fourth Thursday of each month, opens at 6:30 AM; Clinic ends at 9 AM.  Patients are seen on a first-come first-served basis, and a limited number are seen during each clinic.  ° °[facility] ° [address], [place], [place] (336) 723-7904   Eligibility Requirements °You must have lived in [place], [place], or [place] counties for at least the last three months. °  You cannot be eligible for state or federal sponsored healthcare insurance, including Veterans Administration, Medicaid, or Medicare. °  You generally cannot be eligible for healthcare insurance through your employer.  °  How to apply: °Eligibility screenings are held every Tuesday and Wednesday afternoon from 1:00 pm until 4:00 pm. You do not need an appointment for the interview!  °[facility] [address], [place], [place] 336-631-2330   °[facility] [facility]  336-342-8273   °[facility]  336-703-3100   °[facility]  336-570-6415   ° °Behavioral Health Resources in the Community: °Intensive Outpatient Programs °Organization         Address  Phone  Notes  °[facility] [address], [place], [place] 336-878-6098   °[facility] [address], [place], [place] 336-832-9800   °[facility] [address], [place], [place] ° 336-882-2125   °[facility] [address],  °[place], [place] 1-800-853-5163 or 336-641-4981   °Substance Abuse Resources °Organization         Address  Phone  Notes  °[facility]  336-882-2125   °[facility]  336-784-9470   °[facility]  336-285-9073   °[facility]  336-845-3988   °Residential & Outpatient Substance Abuse Program  1-800-659-3381   °Psychological Services °Organization         Address  Phone  Notes  °[facility]  336- 832-9600   °[facility]  336- 378-7881   °[facility] [address], [place] 1-800-853-5163 or 336-641-4981   ° °Mobile Crisis Teams °Organization         Address  Phone  Notes  °[facility], Mobile Crisis Care Unit  1-877-626-1772   °Assertive °[facility] ° [address] [place], [place] 336-834-9664   °Sharon DeEsch [address] °[place] 336-554-5454   ° °Self-Help/Support Groups °Organization         Address  Phone             Notes  °[facility] - variety of support groups  336- 373-1402 Call for more information  °Narcotics Anonymous (NA), [facility] [address], °[place]  2 meetings at this location  ° °Residential Treatment Programs °Organization         Address  Phone  Notes  °[facility] [address],    °[place]  1-866-801-8205   °[facility] ° [address], [place], [place] 704-293-8524   °[facility] [address], [place]  336-845-3988 Admissions: 8am-3pm M-F  °[facility] [address]    °[place], [place] 336-841-1104   °[facility] [address], [place], [place] 336-379-7146   °[facility] [address]  °[place], [place] 336-285-9073   °  [facility] - Intensive Outpatient [address], [place], [place] 336-852-3033   °[facility] [address]  °[place], [place] 1-877-615-2722 or 336-784-9470   °[facility] [address] , [place] 336-227-7417 Accepts Medicaid  °[facility] [address]  °[place] 1-800-659-3381 Substance Abuse/Addiction Treatment  ° °[place] Behavioral Health Resources °Organization         Address  Phone  Notes  °[facility]  (888) 581-9988   °Julie Brannon, PhD [address] [place], [place]   (336) 349-5553 or (336) 951-0000   °[facility]   [address] °[place], [place] (336) 349-4454   °[facility] [address], [place], [place] (336) 342-8316 Insurance/Medicaid/sponsorship through [facility]  °[facility] [address]                                    [place], [place] (336) 342-8316 Therapy/tele-psych/case  °[facility] [address]  ° [place], [place] (336) 349-2233    °Dr. Arfeen  (336) 349-4544   °[facility] [place]  [employer] [place] [facility] 1) [address], [place] °2) [address], [place] °3)  [address], [place] (336) 349-3220 °(336) 342-7768 ° °(336) 342-8140   °[place] [facility] Abuse [facility] (336) 342-1394 or (336) 342-3537 (After Hours)    ° ° ° °

## 2013-09-15 NOTE — BH Assessment (Signed)
Assessment Note   Patient is a 33 year old African American male who requests detox from alcohol.  Patient was discharged from [facility] on 09-13-2013 for detox.   Patient reports to TTS that he is able to contact for safety.  Documentation in the epic chart reports that the patient states to the nurse at 3:18am that he is able to contract for safety.  Patient denies HI.  Patient denies psychosis.     Patient denies prior mental health services on an outpatient basis.  Patient reports that his last use was yesterday.  Patient was not able to remember how much he used.  Patient tested positive for benzos and marijuana.  His BAL was 203 at 11pm.  Patient denies a history of seizures.    Patient reports that he has a lot on his mind, "a lot of stuff that I need to do that I haven't been doing".   Writer consulted with Dr. Preston Fleeting and the patient will be discharged with outpatient referrals.     Axis I: Alcohol Dependence  Axis II: Deferred Axis III:  Past Medical History  Diagnosis Date  . Abscess     hx of abscess on neck  . Alcohol abuse   . Cellulitis   . Hyponatremia   . Leukocytosis   . Pancreatitis   . Depression    Axis IV: economic problems, housing problems, occupational problems, other psychosocial or environmental problems, problems related to social environment and problems with primary support group Axis V: 41-50 serious symptoms  Past Medical History:  Past Medical History  Diagnosis Date  . Abscess     hx of abscess on neck  . Alcohol abuse   . Cellulitis   . Hyponatremia   . Leukocytosis   . Pancreatitis   . Depression     Past Surgical History  Procedure Laterality Date  . Abscess removed from neck      Family History:  Family History  Problem Relation Age of Onset  . Alcohol abuse Father     Social History:  reports that he has been smoking Cigarettes.  He has a 1 pack-year smoking history. He uses smokeless tobacco. He reports that he drinks about 21.0  ounces of alcohol per week. He reports that he uses illicit drugs (Marijuana and Cocaine).  Additional Social History:     CIWA: CIWA-Ar BP: 114/62 mmHg Pulse Rate: 82 Nausea and Vomiting: no nausea and no vomiting Tactile Disturbances: none Tremor: no tremor Auditory Disturbances: not present Paroxysmal Sweats: two Visual Disturbances: not present Anxiety: two Headache, Fullness in Head: very mild Agitation: normal activity Orientation and Clouding of Sensorium: oriented and can do serial additions CIWA-Ar Total: 5 COWS:    Allergies: No Known Allergies  Home Medications:  (Not in a hospital admission)  OB/GYN Status:  No LMP for male patient.  General Assessment Data Location of Assessment: [facility] ED Is this a Tele or Face-to-Face Assessment?: Face-to-Face Is this an Initial Assessment or a Re-assessment for this encounter?: Initial Assessment Living Arrangements: Alone;Non-relatives/Friends Can pt return to current living arrangement?: Yes Admission Status: Voluntary Is patient capable of signing voluntary admission?: Yes Transfer from: Acute Hospital Referral Source: Self/Family/Friend  Medical Screening Exam [facility] Walk-in ONLY) Medical Exam completed: Yes Reason for MSE not completed:  (NA)  [facility] Care Plan Living Arrangements: Alone;Non-relatives/Friends Name of Psychiatrist: None  Name of Therapist: None   Education Status Is patient currently in school?: No Current Grade: NA Highest grade of school patient  has completed: NA Name of school: NA Contact person: NA  Risk to self Suicidal Ideation: No Suicidal Intent: No Is patient at risk for suicide?: No Suicidal Plan?: No Specify Current Suicidal Plan: None  Access to Means: No Specify Access to Suicidal Means: NA What has been your use of drugs/alcohol within the last 12 months?: Alcohol and Marijuana Previous Attempts/Gestures: No How many times?:  (N/A) Other Self Harm Risks: None  Reported Triggers for Past Attempts: None known Intentional Self Injurious Behavior: None Family Suicide History: No Recent stressful life event(s): Job Loss;Financial Problems Persecutory voices/beliefs?: No Depression: Yes Depression Symptoms: Guilt;Fatigue;Feeling worthless/self pity Substance abuse history and/or treatment for substance abuse?: Yes Suicide prevention information given to non-admitted patients: Not applicable  Risk to Others Homicidal Ideation: No Thoughts of Harm to Others: No Current Homicidal Intent: No Current Homicidal Plan: No Access to Homicidal Means: No Identified Victim: NA History of harm to others?: No Assessment of Violence: None Noted Violent Behavior Description: Calm Does patient have access to weapons?: No Criminal Charges Pending?: No Does patient have a court date: No  Psychosis Hallucinations: None noted Delusions: None noted  Mental Status Report Appear/Hygiene: Disheveled Eye Contact: Fair Motor Activity: Freedom of movement Speech: Logical/coherent Level of Consciousness: Restless;Irritable Mood: Guilty Affect: Appropriate to circumstance Anxiety Level: None Thought Processes: Coherent;Relevant Judgement: Unimpaired Orientation: Person;Place;Time;Situation Obsessive Compulsive Thoughts/Behaviors: None  Cognitive Functioning Concentration: Decreased Memory: Recent Intact;Remote Intact IQ: Average Insight: Fair Impulse Control: Fair Appetite: Fair Weight Loss: 0 Weight Gain: 0 Sleep: No Change Total Hours of Sleep: 7 Vegetative Symptoms: Decreased grooming;Not bathing  ADLScreening [facility]) Patient's cognitive ability adequate to safely complete daily activities?: Yes Patient able to express need for assistance with ADLs?: Yes Independently performs ADLs?: Yes (appropriate for developmental age)  Prior Inpatient Therapy Prior Inpatient Therapy: Yes Prior Therapy Dates: 09-13-2013 [facility] Reason for Treatment: Detox  Prior Outpatient Therapy Prior Outpatient Therapy: No Prior Therapy Dates: None  Prior Therapy Facilty/Provider(s): None  Reason for Treatment: None   ADL Screening (condition at time of admission) Patient's cognitive ability adequate to safely complete daily activities?: Yes Patient able to express need for assistance with ADLs?: Yes Independently performs ADLs?: Yes (appropriate for developmental age)  Home Assistive Devices/Equipment Home Assistive Devices/Equipment: None      Values / Beliefs Cultural Requests During Hospitalization: None Spiritual Requests During Hospitalization: None        Additional Information 1:1 In Past 12 Months?: No CIRT Risk: No Elopement Risk: No Does patient have medical clearance?: Yes     Disposition:  Disposition Initial Assessment Completed for this Encounter: Yes Disposition of Patient: Outpatient treatment Type of outpatient treatment: Adult;Chemical Dependence - Intensive Outpatient Patient referred to: Other (Comment)  On Site Evaluation by:   Reviewed with Physician:    Phillip HealStevenson, Vilda Zollner [place] 09/15/2013 6:12 AM

## 2013-09-15 NOTE — ED Provider Notes (Signed)
PTS consult appreciated. Patient is given resources for outpatient treatment.  Dione Boozeavid Farzana Koci, MD 09/15/13 [phone]

## 2013-09-15 NOTE — ED Notes (Signed)
Pt provided beverage and sandwich 

## 2013-09-24 NOTE — ED Provider Notes (Signed)
CSN: [id number]     Arrival date & time 09/14/13  2312 History   First MD Initiated Contact with Patient 09/15/13 0151     Chief Complaint  Patient presents with  . Medical Clearance   (Consider location/radiation/quality/duration/timing/severity/associated sxs/prior Treatment) The history is provided by the patient.   33 year old male comes in requesting detox from alcohol and marijuana. He last used both of those today. Denies other drug use. He denies depression denies homicidal ideation or suicidal ideation.  Past Medical History  Diagnosis Date  . Abscess     hx of abscess on neck  . Alcohol abuse   . Cellulitis   . Hyponatremia   . Leukocytosis   . Pancreatitis   . Depression    Past Surgical History  Procedure Laterality Date  . Abscess removed from neck     Family History  Problem Relation Age of Onset  . Alcohol abuse Father    History  Substance Use Topics  . Smoking status: Current Every Day Smoker -- 1.00 packs/day for 1 years    Types: Cigarettes  . Smokeless tobacco: Current User  . Alcohol Use: 21.0 oz/week    35 Cans of beer per week    Review of Systems  All other systems reviewed and are negative.    Allergies  Review of patient's allergies indicates no known allergies.  Home Medications   Current Outpatient Rx  Name  Route  Sig  Dispense  Refill  . benztropine (COGENTIN) 1 MG tablet   Oral   Take 1 tablet (1 mg total) by mouth daily.   30 tablet   0   . haloperidol (HALDOL) 0.5 MG tablet   Oral   Take 1 tablet (0.5 mg total) by mouth 2 (two) times daily as needed for agitation.   30 tablet   0   . haloperidol (HALDOL) 2 MG tablet   Oral   Take 1 tablet (2 mg total) by mouth daily at 8 pm.   30 tablet   0   . traZODone (DESYREL) 100 MG tablet   Oral   Take 1 tablet (100 mg total) by mouth at bedtime and may repeat dose one time if needed.   30 tablet   0    BP 114/62  Pulse 82  Temp(Src) 98.8 F (37.1 C) (Oral)  Resp  18  Ht 5\' 11"  (1.803 m)  Wt 135 lb (61.236 kg)  BMI 18.84 kg/m2  SpO2 95% Physical Exam  Nursing note and vitals reviewed.  33 year old male, resting comfortably and in no acute distress. Vital signs are normal. Oxygen saturation is 100%, which is normal. Head is normocephalic and atraumatic. PERRLA, EOMI. Oropharynx is clear. Neck is nontender and supple without adenopathy or JVD. Back is nontender and there is no CVA tenderness. Lungs are clear without rales, wheezes, or rhonchi. Chest is nontender. Heart has regular rate and rhythm without murmur. Abdomen is soft, flat, nontender without masses or hepatosplenomegaly and peristalsis is normoactive. Extremities have no cyanosis or edema, full range of motion is present. Skin is warm and dry without rash. Neurologic: Mental status is normal, cranial nerves are intact, there are no motor or sensory deficits.  ED Course  Procedures (including critical care time) Labs Review Results for orders placed during the hospital encounter of 09/14/13  ACETAMINOPHEN LEVEL      Result Value Range   Acetaminophen (Tylenol), Serum <15.0  10 - 30 ug/mL  CBC  Result Value Range   WBC 8.8  4.0 - 10.5 K/uL   RBC 5.00  4.22 - 5.81 MIL/uL   Hemoglobin 14.8  13.0 - 17.0 g/dL   HCT 96.043.7  45.439.0 - 09.852.0 %   MCV 87.4  78.0 - 100.0 fL   MCH 29.6  26.0 - 34.0 pg   MCHC 33.9  30.0 - 36.0 g/dL   RDW 11.914.5  14.711.5 - 82.915.5 %   Platelets 366  150 - 400 K/uL  COMPREHENSIVE METABOLIC PANEL      Result Value Range   Sodium 141  137 - 147 mEq/L   Potassium 4.0  3.7 - 5.3 mEq/L   Chloride 99  96 - 112 mEq/L   CO2 29  19 - 32 mEq/L   Glucose, Bld 94  70 - 99 mg/dL   BUN 9  6 - 23 mg/dL   Creatinine, Ser 5.620.84  0.50 - 1.35 mg/dL   Calcium 9.6  8.4 - 13.010.5 mg/dL   Total Protein 8.8 (*) 6.0 - 8.3 g/dL   Albumin 4.5  3.5 - 5.2 g/dL   AST 28  0 - 37 U/L   ALT 29  0 - 53 U/L   Alkaline Phosphatase 131 (*) 39 - 117 U/L   Total Bilirubin 0.3  0.3 - 1.2 mg/dL   GFR  calc non Af Amer >90  >90 mL/min   GFR calc Af Amer >90  >90 mL/min  ETHANOL      Result Value Range   Alcohol, Ethyl (B) 203 (*) 0 - 11 mg/dL  SALICYLATE LEVEL      Result Value Range   Salicylate Lvl <2.0 (*) 2.8 - 20.0 mg/dL  URINE RAPID DRUG SCREEN (HOSP PERFORMED)      Result Value Range   Opiates NONE DETECTED  NONE DETECTED   Cocaine NONE DETECTED  NONE DETECTED   Benzodiazepines POSITIVE (*) NONE DETECTED   Amphetamines NONE DETECTED  NONE DETECTED   Tetrahydrocannabinol POSITIVE (*) NONE DETECTED   Barbiturates NONE DETECTED  NONE DETECTED    MDM   1. Alcohol intoxication    Alcohol and marijuana abuse. Consultation will be obtained with TTS to see if he would qualify for inpatient care.    Dione Boozeavid Mee Macdonnell, MD 09/24/13 [phone]

## 2014-02-13 ENCOUNTER — Encounter (HOSPITAL_COMMUNITY): Payer: Self-pay | Admitting: Emergency Medicine

## 2014-02-13 ENCOUNTER — Inpatient Hospital Stay (HOSPITAL_COMMUNITY)
Admission: EM | Admit: 2014-02-13 | Discharge: 2014-02-16 | DRG: 439 | Disposition: A | Discharge status: Home / Self Care | Payer: Self-pay | Attending: Internal Medicine | Admitting: Internal Medicine

## 2014-02-13 ENCOUNTER — Emergency Department (HOSPITAL_COMMUNITY): Payer: Self-pay

## 2014-02-13 DIAGNOSIS — K852 Alcohol induced acute pancreatitis without necrosis or infection: Secondary | ICD-10-CM

## 2014-02-13 DIAGNOSIS — L03115 Cellulitis of right lower limb: Secondary | ICD-10-CM

## 2014-02-13 DIAGNOSIS — E860 Dehydration: Secondary | ICD-10-CM

## 2014-02-13 DIAGNOSIS — R9431 Abnormal electrocardiogram [ECG] [EKG]: Secondary | ICD-10-CM | POA: Diagnosis present

## 2014-02-13 DIAGNOSIS — F1420 Cocaine dependence, uncomplicated: Secondary | ICD-10-CM

## 2014-02-13 DIAGNOSIS — F1220 Cannabis dependence, uncomplicated: Secondary | ICD-10-CM

## 2014-02-13 DIAGNOSIS — F1994 Other psychoactive substance use, unspecified with psychoactive substance-induced mood disorder: Secondary | ICD-10-CM

## 2014-02-13 DIAGNOSIS — R440 Auditory hallucinations: Secondary | ICD-10-CM

## 2014-02-13 DIAGNOSIS — E876 Hypokalemia: Secondary | ICD-10-CM | POA: Diagnosis present

## 2014-02-13 DIAGNOSIS — D72829 Elevated white blood cell count, unspecified: Secondary | ICD-10-CM

## 2014-02-13 DIAGNOSIS — F17200 Nicotine dependence, unspecified, uncomplicated: Secondary | ICD-10-CM | POA: Diagnosis present

## 2014-02-13 DIAGNOSIS — Z720 Tobacco use: Secondary | ICD-10-CM

## 2014-02-13 DIAGNOSIS — F10239 Alcohol dependence with withdrawal, unspecified: Secondary | ICD-10-CM | POA: Diagnosis present

## 2014-02-13 DIAGNOSIS — E871 Hypo-osmolality and hyponatremia: Secondary | ICD-10-CM

## 2014-02-13 DIAGNOSIS — F200 Paranoid schizophrenia: Secondary | ICD-10-CM

## 2014-02-13 DIAGNOSIS — K859 Acute pancreatitis without necrosis or infection, unspecified: Principal | ICD-10-CM

## 2014-02-13 DIAGNOSIS — F1010 Alcohol abuse, uncomplicated: Secondary | ICD-10-CM

## 2014-02-13 LAB — ETHANOL: Alcohol, Ethyl (B): <11 mg/dL (ref 0–11)

## 2014-02-13 LAB — CBC WITH DIFFERENTIAL/PLATELET
BASOS ABS: 0 10*3/uL (ref 0.0–0.1)
Basophils Relative: 0 % (ref 0–1)
Eosinophils Absolute: 0.3 10*3/uL (ref 0.0–0.7)
Eosinophils Relative: 3 % (ref 0–5)
HCT: 46.1 % (ref 39.0–52.0)
Hemoglobin: 15.5 g/dL (ref 13.0–17.0)
LYMPHS PCT: 12 % (ref 12–46)
Lymphs Abs: 1.2 10*3/uL (ref 0.7–4.0)
MCH: 29 pg (ref 26.0–34.0)
MCHC: 33.6 g/dL (ref 30.0–36.0)
MCV: 86.3 fL (ref 78.0–100.0)
Monocytes Absolute: 0.8 10*3/uL (ref 0.1–1.0)
Monocytes Relative: 7 % (ref 3–12)
NEUTROS ABS: 8.3 10*3/uL — AB (ref 1.7–7.7)
Neutrophils Relative %: 78 % — ABNORMAL HIGH (ref 43–77)
Platelets: 376 10*3/uL (ref 150–400)
RBC: 5.34 MIL/uL (ref 4.22–5.81)
RDW: 14.3 % (ref 11.5–15.5)
WBC: 10.6 10*3/uL — AB (ref 4.0–10.5)

## 2014-02-13 LAB — COMPREHENSIVE METABOLIC PANEL
ALT: 27 U/L (ref 0–53)
AST: 30 U/L (ref 0–37)
Albumin: 4.4 g/dL (ref 3.5–5.2)
Alkaline Phosphatase: 110 U/L (ref 39–117)
BILIRUBIN TOTAL: 0.7 mg/dL (ref 0.3–1.2)
BUN: 11 mg/dL (ref 6–23)
CHLORIDE: 98 meq/L (ref 96–112)
CO2: 28 mEq/L (ref 19–32)
Calcium: 9.6 mg/dL (ref 8.4–10.5)
Creatinine, Ser: 1.01 mg/dL (ref 0.50–1.35)
GFR calc Af Amer: >90 mL/min (ref >90)
Glucose, Bld: 128 mg/dL — ABNORMAL HIGH (ref 70–99)
POTASSIUM: 3.6 meq/L — AB (ref 3.7–5.3)
SODIUM: 139 meq/L (ref 137–147)
Total Protein: 9.2 g/dL — ABNORMAL HIGH (ref 6.0–8.3)

## 2014-02-13 LAB — URINALYSIS, ROUTINE W REFLEX MICROSCOPIC
Glucose, UA: NEGATIVE mg/dL
HGB URINE DIPSTICK: NEGATIVE
Ketones, ur: NEGATIVE mg/dL
Leukocytes, UA: NEGATIVE
NITRITE: NEGATIVE
PH: 6 (ref 5.0–8.0)
Protein, ur: 100 mg/dL — AB
SPECIFIC GRAVITY, URINE: 1.026 (ref 1.005–1.030)
Urobilinogen, UA: 1 mg/dL (ref 0.0–1.0)

## 2014-02-13 LAB — URINE MICROSCOPIC-ADD ON

## 2014-02-13 LAB — I-STAT TROPONIN, ED: Troponin i, poc: 0 ng/mL (ref 0.00–0.08)

## 2014-02-13 LAB — LIPASE, BLOOD: Lipase: 1608 U/L — ABNORMAL HIGH (ref 11–59)

## 2014-02-13 MED ORDER — ONDANSETRON HCL 4 MG/2ML IJ SOLN
4.0000 mg | Freq: Once | INTRAMUSCULAR | Status: AC
  Administered 2014-02-13: 4 mg via INTRAVENOUS
  Filled 2014-02-13: qty 2

## 2014-02-13 MED ORDER — ONDANSETRON HCL 4 MG/2ML IJ SOLN: 4.0000 mg | Freq: Four times a day (QID) | INTRAMUSCULAR | Status: DC | PRN

## 2014-02-13 MED ORDER — SODIUM CHLORIDE 0.9 % IV SOLN
INTRAVENOUS | Status: DC
  Administered 2014-02-14 – 2014-02-15 (×3): via INTRAVENOUS

## 2014-02-13 MED ORDER — SODIUM CHLORIDE 0.9 % IV BOLUS (SEPSIS)
1000.0000 mL | Freq: Once | INTRAVENOUS | Status: AC
  Administered 2014-02-13: 1000 mL via INTRAVENOUS

## 2014-02-13 MED ORDER — LORAZEPAM 2 MG/ML IJ SOLN
1.0000 mg | Freq: Four times a day (QID) | INTRAMUSCULAR | Status: DC | PRN
  Administered 2014-02-15: 1 mg via INTRAVENOUS
  Filled 2014-02-13: qty 1

## 2014-02-13 MED ORDER — PANTOPRAZOLE SODIUM 40 MG IV SOLR
40.0000 mg | INTRAVENOUS | Status: DC
  Administered 2014-02-13 – 2014-02-15 (×3): 40 mg via INTRAVENOUS
  Filled 2014-02-13 (×5): qty 40

## 2014-02-13 MED ORDER — IOHEXOL 300 MG/ML  SOLN
50.0000 mL | Freq: Once | INTRAMUSCULAR | Status: AC | PRN
  Administered 2014-02-13: 50 mL via ORAL

## 2014-02-13 MED ORDER — MORPHINE SULFATE 2 MG/ML IJ SOLN
2.0000 mg | INTRAMUSCULAR | Status: DC | PRN
  Administered 2014-02-13 – 2014-02-14 (×2): 2 mg via INTRAVENOUS
  Filled 2014-02-13 (×2): qty 1

## 2014-02-13 MED ORDER — MORPHINE SULFATE 4 MG/ML IJ SOLN
4.0000 mg | Freq: Once | INTRAMUSCULAR | Status: AC
  Administered 2014-02-13: 4 mg via INTRAVENOUS
  Filled 2014-02-13: qty 1

## 2014-02-13 MED ORDER — THIAMINE HCL 100 MG/ML IJ SOLN
Freq: Once | INTRAVENOUS | Status: AC
  Administered 2014-02-13: 17:00:00 via INTRAVENOUS
  Filled 2014-02-13: qty 1000

## 2014-02-13 MED ORDER — HYDROMORPHONE HCL PF 1 MG/ML IJ SOLN
1.0000 mg | INTRAMUSCULAR | Status: AC | PRN
  Administered 2014-02-13 – 2014-02-14 (×2): 1 mg via INTRAVENOUS
  Filled 2014-02-13 (×2): qty 1

## 2014-02-13 MED ORDER — ONDANSETRON HCL 4 MG PO TABS: 4.0000 mg | ORAL_TABLET | Freq: Four times a day (QID) | ORAL | Status: DC | PRN

## 2014-02-13 MED ORDER — IOHEXOL 300 MG/ML  SOLN
100.0000 mL | Freq: Once | INTRAMUSCULAR | Status: AC | PRN
  Administered 2014-02-13: 100 mL via INTRAVENOUS

## 2014-02-13 MED ORDER — HYDROMORPHONE HCL PF 1 MG/ML IJ SOLN
1.0000 mg | Freq: Once | INTRAMUSCULAR | Status: AC
  Administered 2014-02-13: 1 mg via INTRAVENOUS
  Filled 2014-02-13: qty 1

## 2014-02-13 MED ORDER — LORAZEPAM 1 MG PO TABS
1.0000 mg | ORAL_TABLET | Freq: Four times a day (QID) | ORAL | Status: DC | PRN
  Administered 2014-02-15 – 2014-02-16 (×3): 1 mg via ORAL
  Filled 2014-02-13 (×3): qty 1

## 2014-02-13 MED ORDER — ONDANSETRON HCL 4 MG/2ML IJ SOLN: 4.0000 mg | Freq: Three times a day (TID) | INTRAMUSCULAR | Status: DC | PRN

## 2014-02-13 NOTE — ED Notes (Signed)
Hospitalist MD at bedside. 

## 2014-02-13 NOTE — ED Notes (Signed)
Pt asking for pain medication . PA notified

## 2014-02-13 NOTE — Progress Notes (Signed)
[facility] provided pt with a [employer] Orange Card application, highlighting [facility], to help patient establish primary care.  °

## 2014-02-13 NOTE — ED Notes (Signed)
Patient transported to CT 

## 2014-02-13 NOTE — ED Notes (Signed)
Pt c/o LUQ pain that started last night. Pt states he has vomited about 15 times. Pt denies constipation or diarrhea.

## 2014-02-13 NOTE — ED Provider Notes (Signed)
CSN: [id number]     Arrival date & time 02/13/14  [id number] History   First MD Initiated Contact with Patient 02/13/14 1040     Chief Complaint  Patient presents with  . Abdominal Pain  . Emesis     (Consider location/radiation/quality/duration/timing/severity/associated sxs/prior Treatment) HPI Comments: Patient presents emergency department with chief complaint of left upper quadrant abdominal pain. He states pain started last night. States that he has a history of pancreatitis, but that this feels different. He states that he does drink a lot of alcohol but hasn't had any today. He reports that he has had 15 episodes of vomiting since last night. He denies seeing any blood. He denies any diarrhea, constipation. Denies any previous abdominal surgeries. He has not taken anything to alleviate her symptoms. There are no aggravating or relieving factors.  The history is provided by the patient. No language interpreter was used.    Past Medical History  Diagnosis Date  . Abscess     hx of abscess on neck  . Alcohol abuse   . Cellulitis   . Hyponatremia   . Leukocytosis   . Pancreatitis   . Depression    Past Surgical History  Procedure Laterality Date  . Abscess removed from neck     Family History  Problem Relation Age of Onset  . Alcohol abuse Father    History  Substance Use Topics  . Smoking status: Current Every Day Smoker -- 1.00 packs/day for 1 years    Types: Cigarettes  . Smokeless tobacco: Current User  . Alcohol Use: 21.0 oz/week    35 Cans of beer per week    Review of Systems  Constitutional: Negative for fever and chills.  Respiratory: Negative for shortness of breath.   Cardiovascular: Negative for chest pain.  Gastrointestinal: Positive for nausea, vomiting and abdominal pain. Negative for diarrhea and constipation.  Genitourinary: Negative for dysuria.      Allergies  Review of patient's allergies indicates no known allergies.  Home Medications    Prior to Admission medications   Medication Sig Start Date End Date Taking? Authorizing Provider  benztropine (COGENTIN) 1 MG tablet Take 1 tablet (1 mg total) by mouth daily. 09/12/13   Nanine MeansJamison Lord, NP  haloperidol (HALDOL) 0.5 MG tablet Take 1 tablet (0.5 mg total) by mouth 2 (two) times daily as needed for agitation. 09/12/13   Nanine MeansJamison Lord, NP  haloperidol (HALDOL) 2 MG tablet Take 1 tablet (2 mg total) by mouth daily at 8 pm. 09/12/13   Nanine MeansJamison Lord, NP  traZODone (DESYREL) 100 MG tablet Take 1 tablet (100 mg total) by mouth at bedtime and may repeat dose one time if needed. 09/12/13   Nanine MeansJamison Lord, NP   BP 135/91  Pulse 71  Temp(Src) 98.6 F (37 C) (Oral)  Resp 18  SpO2 95% Physical Exam  Nursing note and vitals reviewed. Constitutional: He is oriented to person, place, and time. He appears well-developed and well-nourished.  HENT:  Head: Normocephalic and atraumatic.  Eyes: Conjunctivae and EOM are normal. Pupils are equal, round, and reactive to light. Right eye exhibits no discharge. Left eye exhibits no discharge. No scleral icterus.  Neck: Normal range of motion. Neck supple. No JVD present.  Cardiovascular: Normal rate, regular rhythm and normal heart sounds.  Exam reveals no gallop and no friction rub.   No murmur heard. Pulmonary/Chest: Effort normal and breath sounds normal. No respiratory distress. He has no wheezes. He has no rales. He exhibits no tenderness.  Abdominal: Soft. He exhibits no distension and no mass. There is tenderness. There is no rebound and no guarding.  Abdomen is tender to palpation over the epigastrium, no lower abdominal tenderness, no pain at McBurney's point, no Murphy's sign  Musculoskeletal: Normal range of motion. He exhibits no edema and no tenderness.  Neurological: He is alert and oriented to person, place, and time.  Skin: Skin is warm and dry.  Psychiatric: He has a normal mood and affect. His behavior is normal. Judgment and thought  content normal.    ED Course  Procedures (including critical care time) Results for orders placed during the hospital encounter of 02/13/14  CBC WITH DIFFERENTIAL      Result Value Ref Range   WBC 10.6 (*) 4.0 - 10.5 K/uL   RBC 5.34  4.22 - 5.81 MIL/uL   Hemoglobin 15.5  13.0 - 17.0 g/dL   HCT 56.2  13.0 - 86.5 %   MCV 86.3  78.0 - 100.0 fL   MCH 29.0  26.0 - 34.0 pg   MCHC 33.6  30.0 - 36.0 g/dL   RDW 78.4  69.6 - 29.5 %   Platelets 376  150 - 400 K/uL   Neutrophils Relative % 78 (*) 43 - 77 %   Neutro Abs 8.3 (*) 1.7 - 7.7 K/uL   Lymphocytes Relative 12  12 - 46 %   Lymphs Abs 1.2  0.7 - 4.0 K/uL   Monocytes Relative 7  3 - 12 %   Monocytes Absolute 0.8  0.1 - 1.0 K/uL   Eosinophils Relative 3  0 - 5 %   Eosinophils Absolute 0.3  0.0 - 0.7 K/uL   Basophils Relative 0  0 - 1 %   Basophils Absolute 0.0  0.0 - 0.1 K/uL  COMPREHENSIVE METABOLIC PANEL      Result Value Ref Range   Sodium 139  137 - 147 mEq/L   Potassium 3.6 (*) 3.7 - 5.3 mEq/L   Chloride 98  96 - 112 mEq/L   CO2 28  19 - 32 mEq/L   Glucose, Bld 128 (*) 70 - 99 mg/dL   BUN 11  6 - 23 mg/dL   Creatinine, Ser 2.84  0.50 - 1.35 mg/dL   Calcium 9.6  8.4 - 13.2 mg/dL   Total Protein 9.2 (*) 6.0 - 8.3 g/dL   Albumin 4.4  3.5 - 5.2 g/dL   AST 30  0 - 37 U/L   ALT 27  0 - 53 U/L   Alkaline Phosphatase 110  39 - 117 U/L   Total Bilirubin 0.7  0.3 - 1.2 mg/dL   GFR calc non Af Amer >90  >90 mL/min   GFR calc Af Amer >90  >90 mL/min  LIPASE, BLOOD      Result Value Ref Range   Lipase 1608 (*) 11 - 59 U/L  URINALYSIS, ROUTINE W REFLEX MICROSCOPIC      Result Value Ref Range   Color, Urine AMBER (*) YELLOW   APPearance CLOUDY (*) CLEAR   Specific Gravity, Urine 1.026  1.005 - 1.030   pH 6.0  5.0 - 8.0   Glucose, UA NEGATIVE  NEGATIVE mg/dL   Hgb urine dipstick NEGATIVE  NEGATIVE   Bilirubin Urine SMALL (*) NEGATIVE   Ketones, ur NEGATIVE  NEGATIVE mg/dL   Protein, ur 440 (*) NEGATIVE mg/dL   Urobilinogen, UA  1.0  0.0 - 1.0 mg/dL   Nitrite NEGATIVE  NEGATIVE   Leukocytes, UA NEGATIVE  NEGATIVE  URINE MICROSCOPIC-ADD ON      Result Value Ref Range   Squamous Epithelial / LPF RARE  RARE   WBC, UA 0-2  <3 WBC/hpf   RBC / HPF 0-2  <3 RBC/hpf   Bacteria, UA FEW (*) RARE  I-STAT TROPOININ, ED      Result Value Ref Range   Troponin i, poc 0.00  0.00 - 0.08 ng/mL   Comment 3            Ct Abdomen Pelvis W Contrast  02/13/2014   CLINICAL DATA:  Left upper quadrant pain. Vomiting. History pancreatitis.  EXAM: CT ABDOMEN AND PELVIS WITH CONTRAST  TECHNIQUE: Multidetector CT imaging of the abdomen and pelvis was performed using the standard protocol following bolus administration of intravenous contrast.  CONTRAST:  50mL OMNIPAQUE IOHEXOL 300 MG/ML SOLN, OMNIPAQUE IOHEXOL 300 MG/ML SOLN  COMPARISON:  CT 06/21/2013.  FINDINGS: Diffuse fatty infiltration the liver. No focal significant focal hepatic abnormality. Spleen normal. Pancreatic and peripancreatic edema noted. These findings are more prominent than on prior CT of 06/21/2013. Findings consistent with pancreatitis. No biliary distention. Gallbladder is nondistended.  Adrenals are normal. Simple left renal cyst. No focal renal abnormality otherwise noted. No hydronephrosis or obstructing ureteral stone. Bladder wall is again noted to be thickened. Prostate and seminal vesicles are noted to be enlarged. Prostatitis cannot be excluded. BPH and or prostate malignancy would be less likely given the patient's age. Small amount of free intraperitoneal fluid is present.  No significant adenopathy. Abdominal aorta is widely patent. Visceral vessels are patent.  Appendix normal. Colon is nondistended. Mild distention of proximal small bowel is noted, this may be from adjacent inflammatory changes related to pancreatitis. No obstructing lesion is identified. No free air. No significant hernia.  Lung bases clear.  Heart size normal.  No acute bony abnormality.   IMPRESSION: 1. Pancreatic and peripancreatic prominent edema consistent with acute pancreatitis. These findings are more prominent on prior CT of 06/21/2013.  2. Prostate and seminal vesicle enlargement. Prostatitis could present in this fashion. BPH and malignancy would be less likely given the patient's age.  : 1. Diffuse pancreatic and peripancreatic prominent edema consistent with acute pancreatitis. Findings are more prominent than on prior CT of 06/21/2013.  1.   Electronically Signed   By: Maisie Fus  Register   On: 02/13/2014 13:09    \ Imaging Review No results found.   EKG Interpretation None      MDM   Final diagnoses:  Alcoholic pancreatitis    Patient with abdominal pain and vomiting. History of pancreatitis. Will check basic labs, give fluids, treat pain, and give Zofran. Serial abdominal exams.  12:14 PM Lipase is 1600. Will order CT for further evaluation.  2:11 PM Patient discussed with Dr. Silverio Lay, patient will need to be admitted for management of acute pancreatitis.  Roxy Horseman, PA-C 02/13/14 1411

## 2014-02-13 NOTE — H&P (Signed)
[facility] History and Physical  SELIG STEUER IAX:[id number] DOB: 1981-06-25 DOA: 02/13/2014  Referring physician: Roxy Horseman, ER PA PCP: No PCP Per Patient   Chief Complaint: Abdominal pain with nausea vomiting  HPI: Antonio Alvarez is a 33 y.o. male  With past medical history of alcohol abuse who is a previous hospitalizations for alcohol pancreatitis presents with complaints of abdominal pain mostly in the midepigastric region with associated nausea and vomiting times a little over one day. No diarrhea or constipation. The patient in the emergency room, labs recheck lipase was noted to be at 1600. Patient given medication for pain and nausea start on IV fluids. CT scan confirms acute pancreatitis. Hospitalists call for further evaluation and admission   Review of Systems:  Patient seen down in emergency room. Doing okay. Minimal abdominal discomfort. Currently nausea better. Denies any headaches, vision changes, dysphagia, chest pain, palpitations, shortness of breath, wheeze, cough. No constipation or diarrhea. No hematuria, dysuria, focal extremity numbness or weakness or pain. Review systems is otherwise negative.  Past Medical History  Diagnosis Date  . Abscess     hx of abscess on neck  . Alcohol abuse   . Cellulitis   . Hyponatremia   . Leukocytosis   . Pancreatitis   . Depression    Past Surgical History  Procedure Laterality Date  . Abscess removed from neck     Social History:  reports that he has been smoking Cigarettes.  He has a 1 pack-year smoking history. He has never used smokeless tobacco. He reports that he drinks about 21 ounces of alcohol per week. He reports that he uses illicit drugs (Marijuana and Cocaine).  patient lives at home by himself. He is normally able to ambulate without any assistance  No Known Allergies  Family History  Problem Relation Age of Onset  . Alcohol abuse Father      Prior to Admission medications   Not on File    Physical Exam: Filed Vitals:   02/13/14 1544  BP: 132/87  Pulse: 52  Temp: 97.8 F (36.6 C)  Resp: 16    BP 132/87  Pulse 52  Temp(Src) 97.8 F (36.6 C) (Oral)  Resp 16  Ht 5\' 11"  (1.803 m)  Wt 69.446 kg (153 lb 1.6 oz)  BMI 21.36 kg/m2  SpO2 98%  General:  Appears calm and comfortable, no acute distress Eyes: Extraocular movements are intact, sclera nonicteric ENT: Normocephalic, atraumatic, mucous members are moist Cardiovascular: Regular rate and rhythm, S1-S2 Respiratory: Clear to auscultation bilaterally Abdomen: Soft, nontender, nondistended, positive bowel sounds Skin:  No skin breaks, tears or lesions Musculoskeletal: No clubbing or cyanosis or edema Psychiatric: Patient interactive, no acute signs of psychosis Neurologic: No focal deficits           Labs on Admission:  Basic Metabolic Panel:  Recent Labs Lab 02/13/14 1046  NA 139  K 3.6*  CL 98  CO2 28  GLUCOSE 128*  BUN 11  CREATININE 1.01  CALCIUM 9.6   Liver Function Tests:  Recent Labs Lab 02/13/14 1046  AST 30  ALT 27  ALKPHOS 110  BILITOT 0.7  PROT 9.2*  ALBUMIN 4.4    Recent Labs Lab 02/13/14 1046  LIPASE 1608*   No results found for this basename: AMMONIA,  in the last 168 hours CBC:  Recent Labs Lab 02/13/14 1046  WBC 10.6*  NEUTROABS 8.3*  HGB 15.5  HCT 46.1  MCV 86.3  PLT 376   Cardiac Enzymes:  No results found for this basename: CKTOTAL, CKMB, CKMBINDEX, TROPONINI,  in the last 168 hours  BNP (last 3 results) No results found for this basename: PROBNP,  in the last 8760 hours CBG: No results found for this basename: GLUCAP,  in the last 168 hours  Radiological Exams on Admission: Ct Abdomen Pelvis W Contrast  02/13/2014     IMPRESSION: 1. Pancreatic and peripancreatic prominent edema consistent with acute pancreatitis. These findings are more prominent on prior CT of 06/21/2013.  2. Prostate and seminal vesicle enlargement. Prostatitis could present in  this fashion. BPH and malignancy would be less likely given the patient's age.  : 1. Diffuse pancreatic and peripancreatic prominent edema consistent with acute pancreatitis. Findings are more prominent than on prior CT of 06/21/2013.  1.   Electronically Signed   By: Maisie Fushomas  Register   On: 02/13/2014 13:09    EKG: Independently reviewed. Sinoatrial wandering,   Assessment/Plan Active Problems:   Alcohol abuse: Last drink was 2 days ago. On CIWA protocol   Paranoid schizophrenia: Currently stable. Not in acute psychoses. Has no home medications   Alcoholic pancreatitis: N.p.o. plus IV fluids plus medicine for pain and nausea. Check lipase level morning   Tobacco abuse: Patient declined nicotine patch. EKG abnormality: Looks like sinoatrial wandering and not really impressed by questionable ST elevation in V4. Initial troponin negative. We'll repeat the morning.    Code Status: Full code  Family Communication: No family present. Patient told me not to: A1.  Disposition Plan: Likely here for several days.  Time spent: 35 minutes  Hollice EspySendil K Adriella Essex [facility] Pager [phone]  **Disclaimer: This note may have been dictated with voice recognition software. Similar sounding words can inadvertently be transcribed and this note may contain transcription errors which may not have been corrected upon publication of note.**

## 2014-02-13 NOTE — Progress Notes (Signed)
02/13/14 1700  Clinical Encounter Type  Visited With Patient  Visit Type Initial  Referral From (Nursing Secretary)  Spiritual Encounters  Spiritual Needs Emotional   Visited briefly to offer support, as, per referral, pt came in alone.  He welcomed pastoral presence.  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  Please page as needs arise.  [address] [place], [place] [phone]

## 2014-02-13 NOTE — Progress Notes (Signed)
UR completed 

## 2014-02-13 NOTE — ED Notes (Signed)
Pt reports Hx of pancreatitis and admits to drinking ETOH.

## 2014-02-14 DIAGNOSIS — K859 Acute pancreatitis without necrosis or infection, unspecified: Principal | ICD-10-CM

## 2014-02-14 DIAGNOSIS — F1010 Alcohol abuse, uncomplicated: Secondary | ICD-10-CM

## 2014-02-14 LAB — BASIC METABOLIC PANEL
BUN: 9 mg/dL (ref 6–23)
CALCIUM: 8.7 mg/dL (ref 8.4–10.5)
CO2: 27 mEq/L (ref 19–32)
Chloride: 101 mEq/L (ref 96–112)
Creatinine, Ser: 0.98 mg/dL (ref 0.50–1.35)
GFR calc Af Amer: >90 mL/min (ref >90)
Glucose, Bld: 78 mg/dL (ref 70–99)
Potassium: 4.1 mEq/L (ref 3.7–5.3)
SODIUM: 138 meq/L (ref 137–147)

## 2014-02-14 LAB — CBC
HCT: 43.3 % (ref 39.0–52.0)
Hemoglobin: 14.2 g/dL (ref 13.0–17.0)
MCH: 28.8 pg (ref 26.0–34.0)
MCHC: 32.8 g/dL (ref 30.0–36.0)
MCV: 87.8 fL (ref 78.0–100.0)
PLATELETS: 339 10*3/uL (ref 150–400)
RBC: 4.93 MIL/uL (ref 4.22–5.81)
RDW: 14.4 % (ref 11.5–15.5)
WBC: 12.1 10*3/uL — AB (ref 4.0–10.5)

## 2014-02-14 LAB — LIPASE, BLOOD: LIPASE: 887 U/L — AB (ref 11–59)

## 2014-02-14 LAB — TROPONIN I: Troponin I: <0.3 ng/mL (ref <0.30)

## 2014-02-14 MED ORDER — HYDROMORPHONE HCL PF 1 MG/ML IJ SOLN
1.0000 mg | INTRAMUSCULAR | Status: DC | PRN
  Administered 2014-02-14 – 2014-02-16 (×11): 1 mg via INTRAVENOUS
  Filled 2014-02-14 (×11): qty 1

## 2014-02-14 NOTE — ED Provider Notes (Signed)
Medical screening examination/treatment/procedure(s) were performed by non-physician practitioner and as supervising physician I was immediately available for consultation/collaboration.   EKG Interpretation None        Richardean Canal, MD 02/14/14 [id number]

## 2014-02-14 NOTE — Progress Notes (Signed)
Clinical Social Work Department BRIEF PSYCHOSOCIAL ASSESSMENT 02/14/2014  Patient:  Antonio Alvarez, Antonio Alvarez     Account Number:  [id number]     Admit date:  02/13/2014  Clinical Social Worker:  Earlie Server  Date/Time:  02/14/2014 03:00 PM  Referred by:  Physician  Date Referred:  02/14/2014 Referred for  Substance Abuse  Homelessness   Other Referral:   Interview type:  Patient Other interview type:    PSYCHOSOCIAL DATA Living Status:  ALONE Admitted from facility:   Level of care:   Primary support name:  Diane Primary support relationship to patient:  PARENT Degree of support available:   Adequate    CURRENT CONCERNS Current Concerns  Post-Acute Placement  Substance Abuse   Other Concerns:    SOCIAL WORK ASSESSMENT / PLAN CSW received referral in order to complete psychosocial assessment. CSW reviewed chart and met with patient at bedside. CSW introduced myself and explained role.    Patient reports that he is living in [place] off of [address] in a "spot" he claims is like a shed. Patient states that he works at a [employer] but uses all of his money on alcohol and marijuana so he often sleeps on the streets. Patient reports he has been to shelters in the past and does not like them. Patient reports that he speaks with his mother but that he would never ask to move back in with her. Patient reports that he does not mind his living situation and at times he uses some of his money on a hotel.    Patient agreeable to complete SBIRT. Patient scored 17 on SBIRT. CSW spoke with patient about his drinking habits and his plans at DC. Patient reports that he has reduced his consumption but still drinks heavily enough at times to black out. Patient reports that he was sober for about 5 months when he was living in [place] with father. Patient is aware of harmful side effects from alcohol consumption but reports that he is not ready to quit drinking. Patient reports that he will  consider reducing his consumption so that he can afford permanent housing. Patient also admits to smoking marijuana because he enjoys "the high".    CSW discussed if MH was related to SA. Patient admits to being diagnosed with schizophrenia and reports "I still have it now and then." CSW asked patient to clarify what he meant by this and patient reported that he continues to experience AH and VH at times. Patient denies any commanding hallucinations but reports that hallucinations and sleeping problems improve when he drinks alcohol. CSW encouraged patient to follow up with psychiatrist re: medication management instead of relying on substances. Patient has been to [facility] in the past but reports he missed his last appointment on 5/18. Patient reports he will consider going back to [facility] so that he can reduce his alcohol use.    CSW will continue to follow and assist as needed.   Assessment/plan status:  Psychosocial Support/Ongoing Assessment of Needs Other assessment/ plan:   SBIRT   Information/referral to community resources:   Referrals for SA treatment-paitent declined  [facility]    PATIENT'S/FAMILY'S RESPONSE TO PLAN OF CARE: Patient alert and oriented. Patient somewhat guarded and does not want to talk about SA treatment options. Patient more open to discuss [place] needs and agrees that he needs to follow up on medications. Patient is a proud individual and feels that he would be admitting failure if he asked to move back in with  mom or go to a shelter. Patient thanked CSW for time and agreeable for CSW to continue to follow.       [place], [place] [phone]

## 2014-02-14 NOTE — Progress Notes (Signed)
[facility] PROGRESS NOTE  WRIGHT MOLLE LEX:[id number] DOB: September 29, 1980 DOA: 02/13/2014 PCP: No PCP Per Patient Interim summary: Antonio Alvarez is a 33 y.o. male  With past medical history of alcohol abuse who is a previous hospitalizations for alcohol pancreatitis presents with complaints of abdominal pain mostly in the midepigastric region with associated nausea and vomiting times a little over one day. No diarrhea or constipation. The patient in the emergency room, labs recheck lipase was noted to be at 1600. Patient given medication for pain and nausea start on IV fluids. CT scan confirms acute pancreatitis. Hospitalists call for further evaluation and admission He was started on IV fluids, IV pain control with dilaudid, as he reports morphine is not helping relieve the pain and NPO.  His repeat lipase levels improved to 880 this am. His labs revealed worsening leukocytosis, but remains afebrile and non toxic.   Assessment/Plan: Acute  Alcoholic pancreatitis: - admitted to med surg, on IV pain control, increased fluids to 149ml/hr, continue with NPO status at this time.  - repeat lipase in am.  - alcohol cessation counseling given  Paranoid schizophrenia: Currently stable. Not in acute psychoses. Has no home medications   Tobacco abuse: Patient declined nicotine patch.   EKG abnormality: Looks like sinoatrial wandering and not really impressed by questionable ST elevation in V4. Initial troponin negative.  Repeat EKG ordered and repeat troponin is negative. He denies any chest pain or sob.   Alcohol abuse: - CIWA on board. No withdrawal symptoms so far.   Leukocytosis: - probably reactive. Afebrile non toxic looking. UA is negative. No upper respiratory symptoms.    DVT prophylaxis.    Code Status: full code.  Family Communication: none at bedside Disposition Plan: remain inpatient.     Consultants:  none  Procedures:  none  Antibiotics:  none  HPI/Subjective: Pain is 7 out of 10. No nausea or vomiting.   Objective: Filed Vitals:   02/14/14 0545  BP: 104/68  Pulse: 53  Temp: 98.4 F (36.9 C)  Resp: 18    Intake/Output Summary (Last 24 hours) at 02/14/14 0947 Last data filed at 02/13/14 2010  Gross per 24 hour  Intake      0 ml  Output    200 ml  Net   -200 ml   Filed Weights   02/13/14 1544  Weight: 69.446 kg (153 lb 1.6 oz)    Exam:   General:  Alert afebrile no in distress.   Cardiovascular: s1s2  Respiratory: ctab no wheezing or rhonchi.   Abdomen: soft mild tenderness, no peritoneal signs, no rebound tenderness,  ND BS+  Musculoskeletal: no pedal edema.   Data Reviewed: Basic Metabolic Panel:  Recent Labs Lab 02/13/14 1046 02/14/14 0540  NA 139 138  K 3.6* 4.1  CL 98 101  CO2 28 27  GLUCOSE 128* 78  BUN 11 9  CREATININE 1.01 0.98  CALCIUM 9.6 8.7   Liver Function Tests:  Recent Labs Lab 02/13/14 1046  AST 30  ALT 27  ALKPHOS 110  BILITOT 0.7  PROT 9.2*  ALBUMIN 4.4    Recent Labs Lab 02/13/14 1046 02/14/14 0540  LIPASE 1608* 887*   No results found for this basename: AMMONIA,  in the last 168 hours CBC:  Recent Labs Lab 02/13/14 1046 02/14/14 0540  WBC 10.6* 12.1*  NEUTROABS 8.3*  --   HGB 15.5 14.2  HCT 46.1 43.3  MCV 86.3 87.8  PLT 376 339   Cardiac Enzymes:  Recent Labs Lab 02/14/14 0540  TROPONINI <0.30   BNP (last 3 results) No results found for this basename: PROBNP,  in the last 8760 hours CBG: No results found for this basename: GLUCAP,  in the last 168 hours  No results found for this or any previous visit (from the past 240 hour(s)).   Studies: Ct Abdomen Pelvis W Contrast  02/13/2014   CLINICAL DATA:  Left upper quadrant pain. Vomiting. History pancreatitis.  EXAM: CT ABDOMEN AND PELVIS WITH CONTRAST  TECHNIQUE: Multidetector CT imaging of the abdomen and pelvis was  performed using the standard protocol following bolus administration of intravenous contrast.  CONTRAST:  50mL OMNIPAQUE IOHEXOL 300 MG/ML SOLN, 100mL OMNIPAQUE IOHEXOL 300 MG/ML SOLN  COMPARISON:  CT 06/21/2013.  FINDINGS: Diffuse fatty infiltration the liver. No focal significant focal hepatic abnormality. Spleen normal. Pancreatic and peripancreatic edema noted. These findings are more prominent than on prior CT of 06/21/2013. Findings consistent with pancreatitis. No biliary distention. Gallbladder is nondistended.  Adrenals are normal. Simple left renal cyst. No focal renal abnormality otherwise noted. No hydronephrosis or obstructing ureteral stone. Bladder wall is again noted to be thickened. Prostate and seminal vesicles are noted to be enlarged. Prostatitis cannot be excluded. BPH and or prostate malignancy would be less likely given the patient's age. Small amount of free intraperitoneal fluid is present.  No significant adenopathy. Abdominal aorta is widely patent. Visceral vessels are patent.  Appendix normal. Colon is nondistended. Mild distention of proximal small bowel is noted, this may be from adjacent inflammatory changes related to pancreatitis. No obstructing lesion is identified. No free air. No significant hernia.  Lung bases clear.  Heart size normal.  No acute bony abnormality.  IMPRESSION: 1. Pancreatic and peripancreatic prominent edema consistent with acute pancreatitis. These findings are more prominent on prior CT of 06/21/2013.  2. Prostate and seminal vesicle enlargement. Prostatitis could present in this fashion. BPH and malignancy would be less likely given the patient's age.  : 1. Diffuse pancreatic and peripancreatic prominent edema consistent with acute pancreatitis. Findings are more prominent than on prior CT of 06/21/2013.  1.   Electronically Signed   By: Maisie Fushomas  Register   On: 02/13/2014 13:09    Scheduled Meds: . pantoprazole (PROTONIX) IV  40 mg Intravenous Q24H    Continuous Infusions: . sodium chloride 75 mL/hr at 02/14/14 0239    Active Problems:   Alcohol abuse   Cocaine dependence   Cannabis dependence   Paranoid schizophrenia   Alcoholic pancreatitis   Tobacco abuse   Pancreatitis    Time spent: 25 minutes.     Kathlen ModyVijaya Lleyton Byers  [facility] Pager [phone] If 7PM-7AM, please contact night-coverage at [email or web address], password [email or web address] 02/14/2014, 9:47 AM  LOS: 1 day

## 2014-02-14 NOTE — Care Management Note (Signed)
    Page 1 of 1   02/16/2014     10:35:38 AM CARE MANAGEMENT NOTE 02/16/2014  Patient:  RISHABH, MCMURDIE   Account Number:  [id number]  Date Initiated:  02/14/2014  Documentation initiated by:  [facility]  Subjective/Objective Assessment:   33 year old male admitted with pancreatitis.     Action/Plan:   From home.   Anticipated DC Date:  02/16/2014   Anticipated DC Plan:  HOME/SELF CARE      DC Planning Services  CM consult      Choice offered to / List presented to:             Status of service:  In process, will continue to follow Medicare Important Message given?   (If response is "NO", the following Medicare IM given date fields will be blank) Date Medicare IM given:   Date Additional Medicare IM given:    Discharge Disposition:    Per UR Regulation:  Reviewed for med. necessity/level of care/duration of stay  If discussed at Long Length of Stay Meetings, dates discussed:    Comments:  02/16/14 Algernon Huxley RN BSN [phone] No d/c medications.  02/14/14 Trent Gabler RN BSN [phone] CM referral received in rgeards to pt not being able to get medications. At this time it does not look he is was on any medications PTA. I will will continue to follow for medication needs.

## 2014-02-15 DIAGNOSIS — F17200 Nicotine dependence, unspecified, uncomplicated: Secondary | ICD-10-CM

## 2014-02-15 DIAGNOSIS — F200 Paranoid schizophrenia: Secondary | ICD-10-CM

## 2014-02-15 DIAGNOSIS — D72829 Elevated white blood cell count, unspecified: Secondary | ICD-10-CM

## 2014-02-15 LAB — CBC
HCT: 42.8 % (ref 39.0–52.0)
Hemoglobin: 13.8 g/dL (ref 13.0–17.0)
MCH: 28.5 pg (ref 26.0–34.0)
MCHC: 32.2 g/dL (ref 30.0–36.0)
MCV: 88.4 fL (ref 78.0–100.0)
PLATELETS: 345 10*3/uL (ref 150–400)
RBC: 4.84 MIL/uL (ref 4.22–5.81)
RDW: 14.2 % (ref 11.5–15.5)
WBC: 10.2 10*3/uL (ref 4.0–10.5)

## 2014-02-15 LAB — LIPASE, BLOOD: LIPASE: 273 U/L — AB (ref 11–59)

## 2014-02-15 NOTE — Progress Notes (Signed)
[facility]                                                                              Patient Demographics  Antonio Alvarez, is a 33 y.o. male, DOB - 08/20/1981, ZOX:[id number]  Admit date - 02/13/2014   Admitting Physician Hollice EspySendil K Krishnan, MD  Outpatient Primary MD for the patient is No PCP Per Patient  LOS - 2   Chief Complaint  Patient presents with  . Abdominal Pain  . Emesis      HPI: Antonio LarocheRaphael P Alvarez is a 33 y.o. male with past medical history of alcohol abuse who has hadprevious hospitalizations for alcohol pancreatitis presented with complaints of abdominal pain mostly in the midepigastric region with associated nausea and vomiting. No diarrhea or constipation. In the emergency room, labs were checked and lipase was noted to be at 1600. Patient given medication for pain and nausea and started on IV fluids. CT scan confirms acute pancreatitis. Hospitalists called for further evaluation and admission   Assessment & Plan   Acute pancreatitis likely secondary to alcohol abuse -Upon admission, lipase of 1608, currently trending downward, 273 -Patient counseling currently on CIWA protocol -CT of the abdomen and pelvis: Pancreatic and peripancreatic prominent edema consistent with acute pancreatitis -Continue IV fluids, antiemetics, pain control -Initially n.p.o., will start clear liquids  Paranoid schizophrenia -Currently stable, on no home medications  Tobacco abuse -Patient declined nicotine patch  Abnormal EKG -Repeat EKG as well as troponin were negative -Patient currently chest pain-free  Leukocytosis -Resolved Currently afebrile, UA was negative, no upper respiratory symptoms -Likely reactive  Hypokalemia -Resolved  Code Status: Full   Family Communication: None at bedside  Disposition Plan: Admitted  Time Spent in minutes   30 minutes  Procedures none  Consults  none  DVT Prophylaxis  SCDs  Lab Results  Component Value Date   PLT 345  02/15/2014    Medications  Scheduled Meds: . pantoprazole (PROTONIX) IV  40 mg Intravenous Q24H   Continuous Infusions: . sodium chloride 100 mL/hr at 02/14/14 1523   PRN Meds:.HYDROmorphone (DILAUDID) injection, LORazepam, LORazepam, ondansetron (ZOFRAN) IV, ondansetron  Antibiotics   Anti-infectives   None      Subjective:   Antonio FeltyRaphael Meraz seen and examined today.  Patient states he still has abdominal pain, but wishes to drink fluids.  Patient denies dizziness, chest pain, shortness of breath, N/V/D/C, new weakness, numbess, tingling.    Objective:   Filed Vitals:   02/14/14 1800 02/15/14 0002 02/15/14 0617 02/15/14 0800  BP: 111/66 124/76 119/71 118/73  Pulse: 56 63 56 56  Temp: 98.6 F (37 C) 98.3 F (36.8 C) 98.3 F (36.8 C) 98.7 F (37.1 C)  TempSrc: Oral Oral Oral Oral  Resp: 18 20 20 18   Height:      Weight:      SpO2: 98% 99% 99% 100%    Wt Readings from Last 3 Encounters:  02/13/14 69.446 kg (153 lb 1.6 oz)  09/14/13 61.236 kg (135 lb)  09/08/13 62.37 kg (137 lb 8 oz)     Intake/Output Summary (Last 24 hours) at 02/15/14 0816 Last data filed at 02/15/14 0524  Gross per 24 hour  Intake 2445.42 ml  Output      0 ml  Net 2445.42 ml    Exam  General: Well developed, well nourished, NAD, appears stated age  HEENT: NCAT, PERRLA, EOMI, Anicteic Sclera, mucous membranes moist.   Neck: Supple, no JVD, no masses  Cardiovascular: S1 S2 auscultated, no rubs, murmurs or gallops. Regular rate and rhythm.  Respiratory: Clear to auscultation bilaterally with equal chest rise  Abdomen: Soft, diffusely tender, nondistended, + bowel sounds  Extremities: warm dry without cyanosis clubbing or edema  Neuro: AAOx3, cranial nerves grossly intact. Strength 5/5 in patient's upper and lower extremities bilaterally  Skin: Without rashes exudates or nodules, multiple tattoos  Psych: Normal affect and demeanor   Data Review   Micro Results No results found  for this or any previous visit (from the past 240 hour(s)).  Radiology Reports Ct Abdomen Pelvis W Contrast  02/13/2014   CLINICAL DATA:  Left upper quadrant pain. Vomiting. History pancreatitis.  EXAM: CT ABDOMEN AND PELVIS WITH CONTRAST  TECHNIQUE: Multidetector CT imaging of the abdomen and pelvis was performed using the standard protocol following bolus administration of intravenous contrast.  CONTRAST:  75mL OMNIPAQUE IOHEXOL 300 MG/ML SOLN, OMNIPAQUE IOHEXOL 300 MG/ML SOLN  COMPARISON:  CT 06/21/2013.  FINDINGS: Diffuse fatty infiltration the liver. No focal significant focal hepatic abnormality. Spleen normal. Pancreatic and peripancreatic edema noted. These findings are more prominent than on prior CT of 06/21/2013. Findings consistent with pancreatitis. No biliary distention. Gallbladder is nondistended.  Adrenals are normal. Simple left renal cyst. No focal renal abnormality otherwise noted. No hydronephrosis or obstructing ureteral stone. Bladder wall is again noted to be thickened. Prostate and seminal vesicles are noted to be enlarged. Prostatitis cannot be excluded. BPH and or prostate malignancy would be less likely given the patient's age. Small amount of free intraperitoneal fluid is present.  No significant adenopathy. Abdominal aorta is widely patent. Visceral vessels are patent.  Appendix normal. Colon is nondistended. Mild distention of proximal small bowel is noted, this may be from adjacent inflammatory changes related to pancreatitis. No obstructing lesion is identified. No free air. No significant hernia.  Lung bases clear.  Heart size normal.  No acute bony abnormality.  IMPRESSION: 1. Pancreatic and peripancreatic prominent edema consistent with acute pancreatitis. These findings are more prominent on prior CT of 06/21/2013.  2. Prostate and seminal vesicle enlargement. Prostatitis could present in this fashion. BPH and malignancy would be less likely given the patient's age.  :  1. Diffuse pancreatic and peripancreatic prominent edema consistent with acute pancreatitis. Findings are more prominent than on prior CT of 06/21/2013.  1.   Electronically Signed   By: Maisie Fus  Register   On: 02/13/2014 13:09    CBC  Recent Labs Lab 02/13/14 1046 02/14/14 0540 02/15/14 0512  WBC 10.6* 12.1* 10.2  HGB 15.5 14.2 13.8  HCT 46.1 43.3 42.8  PLT 376 339 345  MCV 86.3 87.8 88.4  MCH 29.0 28.8 28.5  MCHC 33.6 32.8 32.2  RDW 14.3 14.4 14.2  LYMPHSABS 1.2  --   --   MONOABS 0.8  --   --   EOSABS 0.3  --   --   BASOSABS 0.0  --   --     Chemistries   Recent Labs Lab 02/13/14 1046 02/14/14 0540  NA 139 138  K 3.6* 4.1  CL 98 101  CO2 28 27  GLUCOSE 128* 78  BUN 11 9  CREATININE 1.01 0.98  CALCIUM  9.6 8.7  AST 30  --   ALT 27  --   ALKPHOS 110  --   BILITOT 0.7  --    ------------------------------------------------------------------------------------------------------------------ estimated creatinine clearance is 104.3 ml/min (by C-G formula based on Cr of 0.98). ------------------------------------------------------------------------------------------------------------------ No results found for this basename: HGBA1C,  in the last 72 hours ------------------------------------------------------------------------------------------------------------------ No results found for this basename: CHOL, HDL, LDLCALC, TRIG, CHOLHDL, LDLDIRECT,  in the last 72 hours ------------------------------------------------------------------------------------------------------------------ No results found for this basename: TSH, T4TOTAL, FREET3, T3FREE, THYROIDAB,  in the last 72 hours ------------------------------------------------------------------------------------------------------------------ No results found for this basename: VITAMINB12, FOLATE, FERRITIN, TIBC, IRON, RETICCTPCT,  in the last 72 hours  Coagulation profile No results found for this basename: INR,  PROTIME,  in the last 168 hours  No results found for this basename: DDIMER,  in the last 72 hours  Cardiac Enzymes  Recent Labs Lab 02/14/14 0540  TROPONINI <0.30   ------------------------------------------------------------------------------------------------------------------ No components found with this basename: POCBNP,     Jalayah Gutridge D.O. on 02/15/2014 at 8:16 AM  Between 7am to 7pm - Pager - [phone]  After 7pm go to [email or web address] - password [email or web address]  And look for the night coverage person covering for me after hours  [facility] Office  [phone]

## 2014-02-16 DIAGNOSIS — E860 Dehydration: Secondary | ICD-10-CM

## 2014-02-16 LAB — CBC
HCT: 42.1 % (ref 39.0–52.0)
HEMOGLOBIN: 14 g/dL (ref 13.0–17.0)
MCH: 29 pg (ref 26.0–34.0)
MCHC: 33.3 g/dL (ref 30.0–36.0)
MCV: 87.3 fL (ref 78.0–100.0)
Platelets: 364 10*3/uL (ref 150–400)
RBC: 4.82 MIL/uL (ref 4.22–5.81)
RDW: 13.9 % (ref 11.5–15.5)
WBC: 8.3 10*3/uL (ref 4.0–10.5)

## 2014-02-16 LAB — BASIC METABOLIC PANEL
BUN: 6 mg/dL (ref 6–23)
CO2: 28 mEq/L (ref 19–32)
Calcium: 9.2 mg/dL (ref 8.4–10.5)
Chloride: 100 mEq/L (ref 96–112)
Creatinine, Ser: 0.88 mg/dL (ref 0.50–1.35)
GFR calc non Af Amer: >90 mL/min (ref >90)
Glucose, Bld: 89 mg/dL (ref 70–99)
POTASSIUM: 3.9 meq/L (ref 3.7–5.3)
SODIUM: 138 meq/L (ref 137–147)

## 2014-02-16 LAB — LIPASE, BLOOD: Lipase: 228 U/L — ABNORMAL HIGH (ref 11–59)

## 2014-02-16 NOTE — Discharge Instructions (Signed)
°  Acute Pancreatitis °Acute pancreatitis is a disease in which the pancreas becomes suddenly irritated (inflamed). The pancreas is a large gland behind your stomach. The pancreas makes enzymes that help digest food. The pancreas also makes 2 hormones that help control your blood sugar. Acute pancreatitis happens when the enzymes attack and damage the pancreas. Most attacks last a couple of days and can cause serious problems. °HOME CARE °· Follow your doctor's diet instructions. You may need to avoid alcohol and limit fat in your diet. °· Eat small meals often. °· Drink enough fluids to keep your pee (urine) clear or pale yellow. °· Only take medicines as told by your doctor. °· Avoid drinking alcohol if it caused your disease. °· Do not smoke. °· Get plenty of rest. °· Check your blood sugar at home as told by your doctor. °· Keep all doctor visits as told. °GET HELP RIGHT AWAY IF:  °· You are unable to eat or keep fluids down. °· Your pain becomes severe. °· You have a fever or lasting symptoms for more than 2 to 3 days. °· You have a fever and your symptoms suddenly get worse. °· Your skin or the white part of your eyes turn yellow (jaundice). °· You throw up (vomit). °· You feel dizzy, or you pass out (faint). °· Your blood sugar is high (over 300 mg/dL). °· You do not get better as quickly as expected. °· You have new or worsening symptoms. °· You have lasting pain, weakness, or feel sick to your stomach (nauseous). °· You get better and then have another pain attack. °MAKE SURE YOU:  °· Understand these instructions. °· Will watch your condition. °· Will get help right away if you are not doing well or get worse. °Document Released: 02/18/2008 Document Revised: 03/02/2012 Document Reviewed: 12/11/2011 °ExitCare® Patient Information ©2014 [employer]. ° ° °

## 2014-02-16 NOTE — Discharge Summary (Signed)
Physician Discharge Summary  Antonio LarocheRaphael P Myer ZHY:[id number] DOB: 08/13/1981 DOA: 02/13/2014  PCP: No PCP Per Patient  Admit date: 02/13/2014 Discharge date: 02/16/2014  Time spent: 45 minutes  Recommendations for Outpatient Follow-up:  Patient will be discharged to home. He is to find a primary care physician and establish care as well as follow up within 2 weeks of discharge. Patient should refrain from alcohol use as well as tobacco abuse. Patient was counseled.  Discharge Diagnoses:  Active Problems:   Acute alcoholic pancreatitis   Alcohol abuse   Paranoid schizophrenia   Alcoholic pancreatitis   Tobacco abuse   Abnormal EKG   Leukocytosis   Hypokalemia  Discharge Condition: Stable  Diet recommendation: Regular  Filed Weights   02/13/14 1544  Weight: 69.446 kg (153 lb 1.6 oz)    History of present illness:  Antonio LarocheRaphael P Alvarez is a 33 y.o. male with past medical history of alcohol abuse who has hadprevious hospitalizations for alcohol pancreatitis presented with complaints of abdominal pain mostly in the midepigastric region with associated nausea and vomiting. No diarrhea or constipation. In the emergency room, labs were checked and lipase was noted to be at 1600. Patient given medication for pain and nausea and started on IV fluids. CT scan confirms acute pancreatitis. Hospitalists called for further evaluation and admission  Hospital Course:  Acute pancreatitis likely secondary to alcohol abuse  -Upon admission, lipase of 1608, currently trending downward, 228  -Patient counseled and was placed on CIWA protocol  -CT of the abdomen and pelvis: Pancreatic and peripancreatic prominent edema consistent with acute pancreatitis  -Initially n.p.o., and was transitioned to clear liquid diet.  He is now tolerating a regular diet without symptoms of pain or nausea.   Paranoid schizophrenia  -Currently stable, on no home medications   Tobacco abuse  -Patient declined nicotine patch    Abnormal EKG  -Repeat EKG as well as troponin were negative  -Patient currently chest pain-free   Leukocytosis  -Resolved  -Currently afebrile, UA was negative, no upper respiratory symptoms  -Likely reactive   Hypokalemia  -Resolved  Procedures: None  Consultations: None  Discharge Exam: Filed Vitals:   02/16/14 0635  BP: 125/78  Pulse: 60  Temp: 98.1 F (36.7 C)  Resp: 18   Exam  General: Well developed, well nourished, NAD, appears stated age  HEENT: NCAT, mucous membranes moist.  Neck: Supple, no JVD, no masses  Cardiovascular: S1 S2 auscultated, no rubs, murmurs or gallops. Regular rate and rhythm.  Respiratory: Clear to auscultation bilaterally with equal chest rise  Abdomen: Soft, diffusely tender, nondistended, + bowel sounds  Extremities: warm dry without cyanosis clubbing or edema  Neuro: AAOx3, no focal deficits Skin: Without rashes exudates or nodules, multiple tattoos  Psych: Normal affect and demeanor   Discharge Instructions      Discharge Instructions   Discharge instructions    Complete by:  As directed   Patient will be discharged to home. He is to find a primary care physician and establish care as well as follow up within 2 weeks of discharge. Patient should refrain from alcohol use as well as tobacco abuse. Patient was counseled.            Medication List    Notice   You have not been prescribed any medications.     No Known Allergies Follow-up Information   Follow up with Primary Care physician. Schedule an appointment as soon as possible for a visit in 2 weeks. [facility](Hospital followup)  The results of significant diagnostics from this hospitalization (including imaging, microbiology, ancillary and laboratory) are listed below for reference.    Significant Diagnostic Studies: Ct Abdomen Pelvis W Contrast  02/13/2014   CLINICAL DATA:  Left upper quadrant pain. Vomiting. History pancreatitis.  EXAM: CT ABDOMEN AND PELVIS  WITH CONTRAST  TECHNIQUE: Multidetector CT imaging of the abdomen and pelvis was performed using the standard protocol following bolus administration of intravenous contrast.  CONTRAST:  22mL OMNIPAQUE IOHEXOL 300 MG/ML SOLN, OMNIPAQUE IOHEXOL 300 MG/ML SOLN  COMPARISON:  CT 06/21/2013.  FINDINGS: Diffuse fatty infiltration the liver. No focal significant focal hepatic abnormality. Spleen normal. Pancreatic and peripancreatic edema noted. These findings are more prominent than on prior CT of 06/21/2013. Findings consistent with pancreatitis. No biliary distention. Gallbladder is nondistended.  Adrenals are normal. Simple left renal cyst. No focal renal abnormality otherwise noted. No hydronephrosis or obstructing ureteral stone. Bladder wall is again noted to be thickened. Prostate and seminal vesicles are noted to be enlarged. Prostatitis cannot be excluded. BPH and or prostate malignancy would be less likely given the patient's age. Small amount of free intraperitoneal fluid is present.  No significant adenopathy. Abdominal aorta is widely patent. Visceral vessels are patent.  Appendix normal. Colon is nondistended. Mild distention of proximal small bowel is noted, this may be from adjacent inflammatory changes related to pancreatitis. No obstructing lesion is identified. No free air. No significant hernia.  Lung bases clear.  Heart size normal.  No acute bony abnormality.  IMPRESSION: 1. Pancreatic and peripancreatic prominent edema consistent with acute pancreatitis. These findings are more prominent on prior CT of 06/21/2013.  2. Prostate and seminal vesicle enlargement. Prostatitis could present in this fashion. BPH and malignancy would be less likely given the patient's age.  : 1. Diffuse pancreatic and peripancreatic prominent edema consistent with acute pancreatitis. Findings are more prominent than on prior CT of 06/21/2013.  1.   Electronically Signed   By: Maisie Fus  Register   On: 02/13/2014 13:09     Microbiology: No results found for this or any previous visit (from the past 240 hour(s)).   Labs: Basic Metabolic Panel:  Recent Labs Lab 02/13/14 1046 02/14/14 0540 02/16/14 0427  NA 139 138 138  K 3.6* 4.1 3.9  CL 98 101 100  CO2 28 27 28   GLUCOSE 128* 78 89  BUN 11 9 6   CREATININE 1.01 0.98 0.88  CALCIUM 9.6 8.7 9.2   Liver Function Tests:  Recent Labs Lab 02/13/14 1046  AST 30  ALT 27  ALKPHOS 110  BILITOT 0.7  PROT 9.2*  ALBUMIN 4.4    Recent Labs Lab 02/13/14 1046 02/14/14 0540 02/15/14 0512 02/16/14 0427  LIPASE 1608* 887* 273* 228*   No results found for this basename: AMMONIA,  in the last 168 hours CBC:  Recent Labs Lab 02/13/14 1046 02/14/14 0540 02/15/14 0512 02/16/14 0427  WBC 10.6* 12.1* 10.2 8.3  NEUTROABS 8.3*  --   --   --   HGB 15.5 14.2 13.8 14.0  HCT 46.1 43.3 42.8 42.1  MCV 86.3 87.8 88.4 87.3  PLT 376 339 345 364   Cardiac Enzymes:  Recent Labs Lab 02/14/14 0540  TROPONINI <0.30   BNP: BNP (last 3 results) No results found for this basename: PROBNP,  in the last 8760 hours CBG: No results found for this basename: GLUCAP,  in the last 168 hours     Signed:  Edsel Petrin  [facility] 02/16/2014, 8:58 AM

## 2015-04-26 ENCOUNTER — Emergency Department (HOSPITAL_COMMUNITY)
Admission: EM | Admit: 2015-04-26 | Discharge: 2015-04-26 | Disposition: A | Discharge status: Home / Self Care | Payer: Self-pay | Attending: Emergency Medicine | Admitting: Emergency Medicine

## 2015-04-26 ENCOUNTER — Encounter (HOSPITAL_COMMUNITY): Payer: Self-pay | Admitting: Emergency Medicine

## 2015-04-26 DIAGNOSIS — Z720 Tobacco use: Secondary | ICD-10-CM | POA: Insufficient documentation

## 2015-04-26 DIAGNOSIS — Z8659 Personal history of other mental and behavioral disorders: Secondary | ICD-10-CM | POA: Insufficient documentation

## 2015-04-26 DIAGNOSIS — Z872 Personal history of diseases of the skin and subcutaneous tissue: Secondary | ICD-10-CM | POA: Insufficient documentation

## 2015-04-26 DIAGNOSIS — Z8639 Personal history of other endocrine, nutritional and metabolic disease: Secondary | ICD-10-CM | POA: Insufficient documentation

## 2015-04-26 DIAGNOSIS — K859 Acute pancreatitis, unspecified: Secondary | ICD-10-CM | POA: Insufficient documentation

## 2015-04-26 DIAGNOSIS — Z862 Personal history of diseases of the blood and blood-forming organs and certain disorders involving the immune mechanism: Secondary | ICD-10-CM | POA: Insufficient documentation

## 2015-04-26 LAB — URINALYSIS, ROUTINE W REFLEX MICROSCOPIC
Bilirubin Urine: NEGATIVE
GLUCOSE, UA: NEGATIVE mg/dL
HGB URINE DIPSTICK: NEGATIVE
Ketones, ur: 15 mg/dL — AB
Leukocytes, UA: NEGATIVE
Nitrite: NEGATIVE
PH: 6 (ref 5.0–8.0)
PROTEIN: 30 mg/dL — AB
Specific Gravity, Urine: 1.021 (ref 1.005–1.030)
Urobilinogen, UA: 1 mg/dL (ref 0.0–1.0)

## 2015-04-26 LAB — CBC
HEMATOCRIT: 43.2 % (ref 39.0–52.0)
Hemoglobin: 14.4 g/dL (ref 13.0–17.0)
MCH: 29.8 pg (ref 26.0–34.0)
MCHC: 33.3 g/dL (ref 30.0–36.0)
MCV: 89.3 fL (ref 78.0–100.0)
Platelets: 294 10*3/uL (ref 150–400)
RBC: 4.84 MIL/uL (ref 4.22–5.81)
RDW: 14.4 % (ref 11.5–15.5)
WBC: 7.5 10*3/uL (ref 4.0–10.5)

## 2015-04-26 LAB — COMPREHENSIVE METABOLIC PANEL
ALBUMIN: 4.5 g/dL (ref 3.5–5.0)
ALK PHOS: 82 U/L (ref 38–126)
ALT: 41 U/L (ref 17–63)
ANION GAP: 13 (ref 5–15)
AST: 54 U/L — ABNORMAL HIGH (ref 15–41)
BILIRUBIN TOTAL: 1 mg/dL (ref 0.3–1.2)
BUN: 10 mg/dL (ref 6–20)
CALCIUM: 9.4 mg/dL (ref 8.9–10.3)
CO2: 25 mmol/L (ref 22–32)
CREATININE: 1.09 mg/dL (ref 0.61–1.24)
Chloride: 99 mmol/L — ABNORMAL LOW (ref 101–111)
GFR calc Af Amer: >60 mL/min (ref >60)
GFR calc non Af Amer: >60 mL/min (ref >60)
GLUCOSE: 129 mg/dL — AB (ref 65–99)
POTASSIUM: 3.6 mmol/L (ref 3.5–5.1)
Sodium: 137 mmol/L (ref 135–145)
Total Protein: 8.3 g/dL — ABNORMAL HIGH (ref 6.5–8.1)

## 2015-04-26 LAB — URINE MICROSCOPIC-ADD ON

## 2015-04-26 LAB — LIPASE, BLOOD: Lipase: 209 U/L — ABNORMAL HIGH (ref 22–51)

## 2015-04-26 MED ORDER — PROMETHAZINE HCL 25 MG PO TABS
25.0000 mg | ORAL_TABLET | Freq: Once | ORAL | Status: AC
  Administered 2015-04-26: 25 mg via ORAL
  Filled 2015-04-26: qty 1

## 2015-04-26 MED ORDER — ONDANSETRON HCL 4 MG/2ML IJ SOLN
4.0000 mg | Freq: Once | INTRAMUSCULAR | Status: AC
  Administered 2015-04-26: 4 mg via INTRAVENOUS
  Filled 2015-04-26: qty 2

## 2015-04-26 MED ORDER — FENTANYL CITRATE (PF) 100 MCG/2ML IJ SOLN
50.0000 ug | Freq: Once | INTRAMUSCULAR | Status: AC
  Administered 2015-04-26: 50 ug via INTRAVENOUS
  Filled 2015-04-26: qty 2

## 2015-04-26 MED ORDER — FAMOTIDINE IN NACL 20-0.9 MG/50ML-% IV SOLN
20.0000 mg | Freq: Once | INTRAVENOUS | Status: AC
  Administered 2015-04-26: 20 mg via INTRAVENOUS
  Filled 2015-04-26: qty 50

## 2015-04-26 MED ORDER — OXYCODONE-ACETAMINOPHEN 5-325 MG PO TABS: 1.0000 | ORAL_TABLET | Freq: Four times a day (QID) | ORAL | Status: DC | PRN

## 2015-04-26 MED ORDER — PROMETHAZINE HCL 25 MG PO TABS: 25.0000 mg | ORAL_TABLET | Freq: Four times a day (QID) | ORAL | Status: DC | PRN

## 2015-04-26 MED ORDER — SODIUM CHLORIDE 0.9 % IV SOLN
Freq: Once | INTRAVENOUS | Status: AC
  Administered 2015-04-26: 06:00:00 via INTRAVENOUS

## 2015-04-26 MED ORDER — HYDROCODONE-ACETAMINOPHEN 5-325 MG PO TABS
2.0000 | ORAL_TABLET | Freq: Once | ORAL | Status: AC
  Administered 2015-04-26: 2 via ORAL
  Filled 2015-04-26: qty 2

## 2015-04-26 MED ORDER — ONDANSETRON HCL 4 MG/2ML IJ SOLN
4.0000 mg | Freq: Once | INTRAMUSCULAR | Status: AC | PRN
  Administered 2015-04-26: 4 mg via INTRAVENOUS
  Filled 2015-04-26: qty 2

## 2015-04-26 NOTE — ED Notes (Signed)
Explained to patient that urine sample is needed.  

## 2015-04-26 NOTE — Discharge Instructions (Signed)
Acute Pancreatitis °Acute pancreatitis is a disease in which the pancreas becomes suddenly inflamed. The pancreas is a large gland located behind your stomach. The pancreas produces enzymes that help digest food. The pancreas also releases the hormones glucagon and insulin that help regulate blood sugar. Damage to the pancreas occurs when the digestive enzymes from the pancreas are activated and begin attacking the pancreas before being released into the intestine. Most acute attacks last a couple of days and can cause serious complications. Some people become dehydrated and develop low blood pressure. In severe cases, bleeding into the pancreas can lead to shock and can be life-threatening. The lungs, heart, and kidneys may fail. °CAUSES  °Pancreatitis can happen to anyone. In some cases, the cause is unknown. Most cases are caused by: °· Alcohol abuse. °· Gallstones. °Other less common causes are: °· Certain medicines. °· Exposure to certain chemicals. °· Infection. °· Damage caused by an accident (trauma). °· Abdominal surgery. °SYMPTOMS  °· Pain in the upper abdomen that may radiate to the back. °· Tenderness and swelling of the abdomen. °· Nausea and vomiting. °DIAGNOSIS  °Your caregiver will perform a physical exam. Blood and stool tests may be done to confirm the diagnosis. Imaging tests may also be done, such as X-rays, CT scans, or an ultrasound of the abdomen. °TREATMENT  °Treatment usually requires a stay in the hospital. Treatment may include: °· Pain medicine. °· Fluid replacement through an intravenous line (IV). °· Placing a tube in the stomach to remove stomach contents and control vomiting. °· Not eating for 3 or 4 days. This gives your pancreas a rest, because enzymes are not being produced that can cause further damage. °· Antibiotic medicines if your condition is caused by an infection. °· Surgery of the pancreas or gallbladder. °HOME CARE INSTRUCTIONS  °· Follow the diet advised by your  caregiver. This may involve avoiding alcohol and decreasing the amount of fat in your diet. °· Eat smaller, more frequent meals. This reduces the amount of digestive juices the pancreas produces. °· Drink enough fluids to keep your urine clear or pale yellow. °· Only take over-the-counter or prescription medicines as directed by your caregiver. °· Avoid drinking alcohol if it caused your condition. °· Do not smoke. °· Get plenty of rest. °· Check your blood sugar at home as directed by your caregiver. °· Keep all follow-up appointments as directed by your caregiver. °SEEK MEDICAL CARE IF:  °· You do not recover as quickly as expected. °· You develop new or worsening symptoms. °· You have persistent pain, weakness, or nausea. °· You recover and then have another episode of pain. °SEEK IMMEDIATE MEDICAL CARE IF:  °· You are unable to eat or keep fluids down. °· Your pain becomes severe. °· You have a fever or persistent symptoms for more than 2 to 3 days. °· You have a fever and your symptoms suddenly get worse. °· Your skin or the white part of your eyes turn yellow (jaundice). °· You develop vomiting. °· You feel dizzy, or you faint. °· Your blood sugar is high (over 300 mg/dL). °MAKE SURE YOU:  °· Understand these instructions. °· Will watch your condition. °· Will get help right away if you are not doing well or get worse. °Document Released: 09/01/2005 Document Revised: 03/02/2012 Document Reviewed: 12/11/2011 °ExitCare® Patient Information ©2015 [employer]. This information is not intended to replace advice given to you by your health care provider. Make sure you discuss any questions you have   with your health care provider. ° ° ° °Clear Liquid Diet °A clear liquid diet is a short-term diet that is prescribed to provide the necessary fluid and basic energy you need when you can have nothing else. The clear liquid diet consists of liquids or solids that will become liquid at room temperature. You should be  able to see through the liquid. There are many reasons that you may be restricted to clear liquids, such as: °· When you have a sudden-onset (acute) condition that occurs before or after surgery. °· To help your body slowly get adjusted to food again after a long period when you were unable to have food. °· Replacement of fluids when you have a diarrheal disease. °· When you are going to have certain exams, such as a colonoscopy, in which instruments are inserted inside your body to look at parts of your digestive system. °WHAT CAN I HAVE? °A clear liquid diet does not provide all the nutrients you need. It is important to choose a variety of the following items to get as many nutrients as possible: °· Vegetable juices that do not have pulp. °· Fruit juices and fruit drinks that do not have pulp. °· Coffee (regular or decaffeinated), tea, or soda at the discretion of your health care provider. °· Clear bouillon, broth, or strained broth-based soups. °· High-protein and flavored gelatins. °· Sugar or honey. °· Ices or frozen ice pops that do not contain milk. °If you are not sure whether you can have certain items, you should ask your health care provider. You may also ask your health care provider if there are any other clear liquid options. °Document Released: 09/01/2005 Document Revised: 09/06/2013 Document Reviewed: 07/29/2013 °ExitCare® Patient Information ©2015 [employer]. This information is not intended to replace advice given to you by your health care provider. Make sure you discuss any questions you have with your health care provider. ° °

## 2015-04-26 NOTE — ED Notes (Signed)
Per EMS, patient is complaining of generalized abdominal pain, hx of pancreatitis. VS bp 128/70, p 84, rr 18.

## 2015-04-26 NOTE — ED Notes (Signed)
Dr. Rhunette Croft in to speak with patient.  Patient states he doesn't feel comfortable leaving yet.  PO challenge completed with no vomiting noted.  Will continue to monitor.

## 2015-04-26 NOTE — ED Notes (Signed)
Patient lying on stretcher.  Patient has completed 1 cup of Sprite; no vomiting noted at this time.  Pepcid hung per order.

## 2015-04-26 NOTE — ED Provider Notes (Addendum)
CSN: [id number]     Arrival date & time 04/26/15  0530 History   First MD Initiated Contact with Patient 04/26/15 [phone]     Chief Complaint  Patient presents with  . Abdominal Pain     (Consider location/radiation/quality/duration/timing/severity/associated sxs/prior Treatment) HPI Comments: Pt comes in with cc of abdominal pain. Pt has hx of chronic pancreatitis. He reports drinking alcohol about 2 times a week now- but no hard liquor and not as heavy. He reports that yday afternoon he started having abd pain, epigastric, non radiating, sharp that was similar to his pancreatitis pain. Pain improved in the evening, and he had a hot dog - which is when his pain got severe. Pt has had 6 episodes of emesis since then, + bilious. No bloody emesis or bloody stools. No hx of abd surgeries.   ROS 10 Systems reviewed and are negative for acute change except as noted in the HPI.     Patient is a 34 y.o. male presenting with abdominal pain. The history is provided by the patient.  Abdominal Pain Associated symptoms: nausea and vomiting     Past Medical History  Diagnosis Date  . Abscess     hx of abscess on neck  . Alcohol abuse   . Cellulitis   . Hyponatremia   . Leukocytosis   . Pancreatitis   . Depression    Past Surgical History  Procedure Laterality Date  . Abscess removed from neck     Family History  Problem Relation Age of Onset  . Alcohol abuse Father    Social History  Substance Use Topics  . Smoking status: Current Every Day Smoker -- 1.00 packs/day for 1 years    Types: Cigarettes  . Smokeless tobacco: Never Used  . Alcohol Use: 21.0 oz/week    35 Cans of beer per week    Review of Systems  Gastrointestinal: Positive for nausea, vomiting and abdominal pain.  All other systems reviewed and are negative.     Allergies  Review of patient's allergies indicates no known allergies.  Home Medications   Prior to Admission medications   Not on File   BP  130/85 mmHg  Pulse 49  Temp(Src) 98.1 F (36.7 C) (Oral)  Resp 18  Ht  (1.803 m)  Wt 135 lb (61.236 kg)  BMI 18.84 kg/m2  SpO2 99% Physical Exam  Constitutional: He is oriented to person, place, and time. He appears well-developed.  HENT:  Head: Normocephalic and atraumatic.  Eyes: Conjunctivae and EOM are normal. Pupils are equal, round, and reactive to light.  Neck: Normal range of motion. Neck supple.  Cardiovascular: Normal rate and regular rhythm.   Pulmonary/Chest: Effort normal and breath sounds normal.  Abdominal: Soft. Bowel sounds are normal. He exhibits no distension. There is tenderness. There is guarding. There is no rebound.  Epigastric tenderness, no peritoneal findings  Neurological: He is alert and oriented to person, place, and time.  Skin: Skin is warm.  Nursing note and vitals reviewed.   ED Course  Procedures (including critical care time)   :50 am: No emesis in the ER with the po challege, but pt is still nauseated and states that he has "gagged" with fluid challenge. Will keep him a bit longer. Signing out to incoming EDP.   Labs Review Labs Reviewed  LIPASE, BLOOD - Abnormal; Notable for the following:    Lipase 209 (*)    All other components within normal limits  COMPREHENSIVE METABOLIC PANEL -  Abnormal; Notable for the following:    Chloride 99 (*)    Glucose, Bld 129 (*)    Total Protein 8.3 (*)    AST 54 (*)    All other components within normal limits  URINALYSIS, ROUTINE W REFLEX MICROSCOPIC (NOT AT [facility]) - Abnormal; Notable for the following:    Color, Urine AMBER (*)    Ketones, ur 15 (*)    Protein, ur 30 (*)    All other components within normal limits  CBC  URINE MICROSCOPIC-ADD ON    Imaging Review No results found.   EKG Interpretation None      MDM   Final diagnoses:  Acute pancreatitis, unspecified pancreatitis type    Pt comes in with cc of abd pain.  DDx includes: Pancreatitis Hepatobiliary  pathology including cholecystitis Gastritis/PUD SBO ACS syndrome  Based on the hx and exam - pancreatitis vs gastritis is the working dx. Lipase is slightly elevated. No peritoneal findings. CT not indicated. VSS and WNL. Will start oral challenge and pain control.   Derwood Kaplan, MD 04/26/15 3086  Derwood Kaplan, MD 04/26/15 [phone]

## 2015-04-26 NOTE — ED Notes (Signed)
Dr. Nanavanti at the bedside.  

## 2015-04-26 NOTE — ED Notes (Signed)
Patient discharged home in good condition.

## 2015-04-26 NOTE — ED Notes (Signed)
Patient given water as PO challenge.  

## 2015-04-27 ENCOUNTER — Inpatient Hospital Stay (HOSPITAL_COMMUNITY)
Admission: EM | Admit: 2015-04-27 | Discharge: 2015-05-04 | DRG: 440 | Disposition: A | Discharge status: Home / Self Care | Payer: Self-pay | Attending: Internal Medicine | Admitting: Internal Medicine

## 2015-04-27 ENCOUNTER — Encounter (HOSPITAL_COMMUNITY): Payer: Self-pay | Admitting: Emergency Medicine

## 2015-04-27 ENCOUNTER — Emergency Department (HOSPITAL_COMMUNITY): Payer: Self-pay

## 2015-04-27 DIAGNOSIS — K852 Alcohol induced acute pancreatitis without necrosis or infection: Secondary | ICD-10-CM

## 2015-04-27 DIAGNOSIS — F1010 Alcohol abuse, uncomplicated: Secondary | ICD-10-CM | POA: Diagnosis present

## 2015-04-27 DIAGNOSIS — R001 Bradycardia, unspecified: Secondary | ICD-10-CM | POA: Diagnosis present

## 2015-04-27 DIAGNOSIS — K760 Fatty (change of) liver, not elsewhere classified: Secondary | ICD-10-CM | POA: Diagnosis present

## 2015-04-27 DIAGNOSIS — D72829 Elevated white blood cell count, unspecified: Secondary | ICD-10-CM | POA: Diagnosis present

## 2015-04-27 DIAGNOSIS — K859 Acute pancreatitis without necrosis or infection, unspecified: Secondary | ICD-10-CM | POA: Diagnosis present

## 2015-04-27 DIAGNOSIS — Z720 Tobacco use: Secondary | ICD-10-CM | POA: Diagnosis present

## 2015-04-27 DIAGNOSIS — Z811 Family history of alcohol abuse and dependence: Secondary | ICD-10-CM

## 2015-04-27 DIAGNOSIS — Z590 Homelessness: Secondary | ICD-10-CM

## 2015-04-27 DIAGNOSIS — K863 Pseudocyst of pancreas: Secondary | ICD-10-CM

## 2015-04-27 LAB — CBC WITH DIFFERENTIAL/PLATELET
BASOS ABS: 0 10*3/uL (ref 0.0–0.1)
Basophils Relative: 0 % (ref 0–1)
Eosinophils Absolute: 0.1 10*3/uL (ref 0.0–0.7)
Eosinophils Relative: 1 % (ref 0–5)
HCT: 46.3 % (ref 39.0–52.0)
Hemoglobin: 15.3 g/dL (ref 13.0–17.0)
LYMPHS PCT: 5 % — AB (ref 12–46)
Lymphs Abs: 0.7 10*3/uL (ref 0.7–4.0)
MCH: 29.6 pg (ref 26.0–34.0)
MCHC: 33 g/dL (ref 30.0–36.0)
MCV: 89.6 fL (ref 78.0–100.0)
Monocytes Absolute: 1.2 10*3/uL — ABNORMAL HIGH (ref 0.1–1.0)
Monocytes Relative: 8 % (ref 3–12)
Neutro Abs: 12.5 10*3/uL — ABNORMAL HIGH (ref 1.7–7.7)
Neutrophils Relative %: 86 % — ABNORMAL HIGH (ref 43–77)
PLATELETS: 289 10*3/uL (ref 150–400)
RBC: 5.17 MIL/uL (ref 4.22–5.81)
RDW: 14.3 % (ref 11.5–15.5)
WBC: 14.4 10*3/uL — AB (ref 4.0–10.5)

## 2015-04-27 LAB — LIPASE, BLOOD: LIPASE: 674 U/L — AB (ref 22–51)

## 2015-04-27 LAB — CBC
HCT: 44 % (ref 39.0–52.0)
Hemoglobin: 14.3 g/dL (ref 13.0–17.0)
MCH: 28.8 pg (ref 26.0–34.0)
MCHC: 32.5 g/dL (ref 30.0–36.0)
MCV: 88.7 fL (ref 78.0–100.0)
PLATELETS: 278 10*3/uL (ref 150–400)
RBC: 4.96 MIL/uL (ref 4.22–5.81)
RDW: 14.3 % (ref 11.5–15.5)
WBC: 14 10*3/uL — ABNORMAL HIGH (ref 4.0–10.5)

## 2015-04-27 LAB — LACTATE DEHYDROGENASE: LDH: 249 U/L — ABNORMAL HIGH (ref 98–192)

## 2015-04-27 LAB — RAPID URINE DRUG SCREEN, HOSP PERFORMED
Amphetamines: NOT DETECTED
Barbiturates: NOT DETECTED
Benzodiazepines: NOT DETECTED
COCAINE: NOT DETECTED
Opiates: POSITIVE — AB
Tetrahydrocannabinol: POSITIVE — AB

## 2015-04-27 LAB — COMPREHENSIVE METABOLIC PANEL
ALBUMIN: 4.6 g/dL (ref 3.5–5.0)
ALT: 36 U/L (ref 17–63)
AST: 38 U/L (ref 15–41)
Alkaline Phosphatase: 80 U/L (ref 38–126)
Anion gap: 10 (ref 5–15)
BUN: 12 mg/dL (ref 6–20)
CHLORIDE: 99 mmol/L — AB (ref 101–111)
CO2: 29 mmol/L (ref 22–32)
CREATININE: 0.97 mg/dL (ref 0.61–1.24)
Calcium: 9.4 mg/dL (ref 8.9–10.3)
GFR calc non Af Amer: >60 mL/min (ref >60)
GLUCOSE: 120 mg/dL — AB (ref 65–99)
POTASSIUM: 3.9 mmol/L (ref 3.5–5.1)
SODIUM: 138 mmol/L (ref 135–145)
TOTAL PROTEIN: 8.7 g/dL — AB (ref 6.5–8.1)
Total Bilirubin: 1 mg/dL (ref 0.3–1.2)

## 2015-04-27 LAB — URINALYSIS, ROUTINE W REFLEX MICROSCOPIC
GLUCOSE, UA: NEGATIVE mg/dL
HGB URINE DIPSTICK: NEGATIVE
Ketones, ur: 15 mg/dL — AB
Nitrite: NEGATIVE
PH: 6 (ref 5.0–8.0)
Protein, ur: 100 mg/dL — AB
Specific Gravity, Urine: 1.031 — ABNORMAL HIGH (ref 1.005–1.030)
Urobilinogen, UA: 0.2 mg/dL (ref 0.0–1.0)

## 2015-04-27 LAB — CREATININE, SERUM
CREATININE: 0.73 mg/dL (ref 0.61–1.24)
GFR calc Af Amer: >60 mL/min (ref >60)
GFR calc non Af Amer: >60 mL/min (ref >60)

## 2015-04-27 LAB — ETHANOL: Alcohol, Ethyl (B): <5 mg/dL (ref <5)

## 2015-04-27 LAB — URINE MICROSCOPIC-ADD ON

## 2015-04-27 MED ORDER — ACETAMINOPHEN 325 MG PO TABS: 650.0000 mg | ORAL_TABLET | Freq: Four times a day (QID) | ORAL | Status: DC | PRN

## 2015-04-27 MED ORDER — MORPHINE SULFATE 2 MG/ML IJ SOLN
2.0000 mg | INTRAMUSCULAR | Status: DC | PRN
  Administered 2015-04-27 – 2015-04-30 (×16): 2 mg via INTRAVENOUS
  Filled 2015-04-27 (×16): qty 1

## 2015-04-27 MED ORDER — SODIUM CHLORIDE 0.9 % IV BOLUS (SEPSIS)
1000.0000 mL | Freq: Once | INTRAVENOUS | Status: AC
Start: 2015-04-27 — End: 2015-04-27
  Administered 2015-04-27: 1000 mL via INTRAVENOUS

## 2015-04-27 MED ORDER — SODIUM CHLORIDE 0.9 % IV SOLN
INTRAVENOUS | Status: AC
  Administered 2015-04-27: 11:00:00 via INTRAVENOUS

## 2015-04-27 MED ORDER — IOHEXOL 300 MG/ML  SOLN
50.0000 mL | Freq: Once | INTRAMUSCULAR | Status: AC | PRN
  Administered 2015-04-27: 50 mL via ORAL

## 2015-04-27 MED ORDER — ALUM & MAG HYDROXIDE-SIMETH 200-200-20 MG/5ML PO SUSP: 30.0000 mL | Freq: Four times a day (QID) | ORAL | Status: DC | PRN

## 2015-04-27 MED ORDER — ENOXAPARIN SODIUM 40 MG/0.4ML ~~LOC~~ SOLN
40.0000 mg | Freq: Every day | SUBCUTANEOUS | Status: DC
  Administered 2015-04-27 – 2015-05-04 (×8): 40 mg via SUBCUTANEOUS
  Filled 2015-04-27 (×8): qty 0.4

## 2015-04-27 MED ORDER — ONDANSETRON HCL 4 MG PO TABS: 4.0000 mg | ORAL_TABLET | Freq: Four times a day (QID) | ORAL | Status: DC | PRN

## 2015-04-27 MED ORDER — ACETAMINOPHEN 650 MG RE SUPP: 650.0000 mg | Freq: Four times a day (QID) | RECTAL | Status: DC | PRN

## 2015-04-27 MED ORDER — SODIUM CHLORIDE 0.9 % IV SOLN
INTRAVENOUS | Status: DC
  Administered 2015-04-27 (×2): via INTRAVENOUS
  Administered 2015-04-28: 150 mL/h via INTRAVENOUS
  Administered 2015-04-28 – 2015-05-03 (×6): via INTRAVENOUS

## 2015-04-27 MED ORDER — MORPHINE SULFATE 4 MG/ML IJ SOLN
4.0000 mg | Freq: Once | INTRAMUSCULAR | Status: AC
  Administered 2015-04-27: 4 mg via INTRAVENOUS
  Filled 2015-04-27: qty 1

## 2015-04-27 MED ORDER — HYDROMORPHONE HCL 1 MG/ML IJ SOLN
1.0000 mg | Freq: Once | INTRAMUSCULAR | Status: AC
  Administered 2015-04-27: 1 mg via INTRAVENOUS
  Filled 2015-04-27: qty 1

## 2015-04-27 MED ORDER — ONDANSETRON HCL 4 MG/2ML IJ SOLN: 4.0000 mg | Freq: Four times a day (QID) | INTRAMUSCULAR | Status: DC | PRN

## 2015-04-27 MED ORDER — SODIUM CHLORIDE 0.9 % IV SOLN
Freq: Once | INTRAVENOUS | Status: AC
  Administered 2015-04-27: 08:00:00 via INTRAVENOUS

## 2015-04-27 MED ORDER — HYDROCODONE-ACETAMINOPHEN 5-325 MG PO TABS
1.0000 | ORAL_TABLET | ORAL | Status: DC | PRN
  Administered 2015-04-29 – 2015-05-03 (×12): 2 via ORAL
  Filled 2015-04-27 (×12): qty 2

## 2015-04-27 MED ORDER — IOHEXOL 300 MG/ML  SOLN
100.0000 mL | Freq: Once | INTRAMUSCULAR | Status: AC | PRN
  Administered 2015-04-27: 100 mL via INTRAVENOUS

## 2015-04-27 MED ORDER — ONDANSETRON HCL 4 MG/2ML IJ SOLN
4.0000 mg | Freq: Once | INTRAMUSCULAR | Status: AC
  Administered 2015-04-27: 4 mg via INTRAVENOUS
  Filled 2015-04-27: qty 2

## 2015-04-27 MED ORDER — SODIUM CHLORIDE 0.9 % IV SOLN
Freq: Once | INTRAVENOUS | Status: AC
  Administered 2015-04-27: 11:00:00 via INTRAVENOUS

## 2015-04-27 NOTE — H&P (Addendum)
[facility] History and Physical  Antonio Alvarez JXB:[id number] DOB: Jan 25, 1981 DOA: 04/27/2015   PCP: No PCP Per Patient    Chief Complaint: abdominal pain, vomiting  HPI: Antonio Alvarez is a 34 y.o. male with h/o alcohol abuse which he states he has cut down on, recent alcoholic pancreatitis for which she was admitted from 6/1 through 6/4 and nicotine abuse who is homeless. The patient states that the day before yesterday in the morning, he was out working in the heat and was drinking soda when he developed abdominal pain.  He then ate a potato salad and with increase in pain and vomiting. He continued to have intermittent vomiting and pain and decided to come into the ER yesterday morning. I am unable to tell how he was treated however, after he was able to tolerate liquids, he was discharged home with prescriptions for Percocet and Phenergan. The patient states that he did not eat anything at all and went to sleep he woke up in the middle of the night with vomiting which continued all through the night. He tried to take some of the medications but continued to vomit and therefore presented to the ER. He has received morphine and Dilaudid and currently his pain is controlled. Pain was present in the mid abdomen and epigastric area. He is currently not feeling nauseated. He has not had any fevers or diarrhea. No bloody vomitus.   General: The patient denies fever, weight loss, Headaches  Cardiac: Denies chest pain, syncope, palpitations, pedal edema  Respiratory: Denies cough, shortness of breath, wheezing GI: Denies severe indigestion/heartburn, abdominal pain, nausea, vomiting, diarrhea and constipation GU: Denies hematuria, incontinence, dysuria  Musculoskeletal: Denies arthritis  Skin: Denies suspicious skin lesions Neurologic: Denies focal weakness or numbness, change in vision Psychiatry: Denies depression or anxiety. Hematologic: no easy bruising or bleeding  All other systems  reviewed and found to be negative.  Past Medical History  Diagnosis Date  . Abscess     hx of abscess on neck  . Alcohol abuse   . Cellulitis   . Hyponatremia   . Leukocytosis   . Pancreatitis   . Depression     Past Surgical History  Procedure Laterality Date  . 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 Alcohol abuse Father   uncle had cirrhosis of the liverHe    Prior to Admission medications   Medication Sig Start Date End Date Taking? Authorizing Provider  oxyCODONE-acetaminophen (PERCOCET/ROXICET) 5-325 MG per tablet Take 1-2 tablets by mouth every 6 (six) hours as needed for severe pain. 04/26/15  Yes Vanetta Mulders, MD  promethazine (PHENERGAN) 25 MG tablet Take 1 tablet (25 mg total) by mouth every 6 (six) hours as needed for nausea or vomiting. 04/26/15  Yes Vanetta Mulders, MD     Physical Exam: Filed Vitals:   04/27/15 0725 04/27/15 1016  BP: 150/90 146/90  Pulse: 51 52  Temp: 98.2 F (36.8 C)   TempSrc: Oral   Resp: 18 18  SpO2: 100% 100%     General: Young male  laying in bed in no acute distress  HEENT: Normocephalic and Atraumatic, Mucous membranes pink                PERRLA; EOM intact; No scleral icterus,                 Nares: Patent, Oropharynx: Clear, Fair Dentition                 Neck: FROM, no cervical lymphadenopathy, thyromegaly, carotid bruit or JVD;  Breasts: deferred CHEST WALL: No tenderness  CHEST: Normal respiration, clear to auscultation bilaterally  HEART: Regular rate and rhythm; no murmurs rubs or gallops  BACK: No kyphosis or scoliosis; no CVA tenderness  GI: Positive Bowel Sounds,  soft, non-tender; no masses, no organomegaly Rectal Exam: deferred MSK: No cyanosis, clubbing, or edema Genitalia: not examined  SKIN:  no rash or ulceration  CNS: Alert and Oriented x 4, Nonfocal exam, CN 2-12 intact  Labs on Admission:  Basic Metabolic Panel:  Recent Labs Lab 04/26/15 0552 04/27/15 0751  NA 137 138  K 3.6 3.9  CL 99* 99*  CO2 25 29  GLUCOSE 129* 120*  BUN 10 12  CREATININE 1.09 0.97  CALCIUM 9.4 9.4   Liver Function Tests:  Recent Labs Lab 04/26/15 0552 04/27/15 0751  AST 54* 38  ALT 41 36  ALKPHOS 82 80  BILITOT 1.0 1.0  PROT 8.3* 8.7*  ALBUMIN 4.5 4.6    Recent Labs Lab 04/26/15 0552 04/27/15 0751  LIPASE 209* 674*   No results for input(s): AMMONIA in the last 168 hours. CBC:  Recent Labs Lab 04/26/15 0552 04/27/15 0751  WBC 7.5 14.4*  NEUTROABS  --  12.5*  HGB 14.4 15.3  HCT 43.2 46.3  MCV 89.3 89.6  PLT 294 289   Cardiac Enzymes: No results for input(s): CKTOTAL, CKMB, CKMBINDEX, TROPONINI in the last 168 hours.  BNP (last 3 results) No results for input(s): BNP in the last 8760 hours.  ProBNP (last 3 results) No results for input(s): PROBNP in the last 8760 hours.  CBG: No results for input(s): GLUCAP in the last 168 hours.  Radiological Exams on Admission: Ct Abdomen Pelvis W Contrast  04/27/2015   CLINICAL DATA:  Homeless patient here with c/o nausea, vomiting x2 days. States that he was recently discharged from hospital with diagnosis of pancreatitis.  EXAM: CT ABDOMEN AND PELVIS WITH CONTRAST  TECHNIQUE: Multidetector CT imaging of the abdomen and pelvis was performed using the standard protocol following bolus administration of intravenous contrast.  CONTRAST:  50mL OMNIPAQUE IOHEXOL 300 MG/ML SOLN, OMNIPAQUE IOHEXOL 300 MG/ML SOLN  COMPARISON:  02/13/2014  FINDINGS: Lung bases:  Clear.  Heart normal in size.  Pancreas call There is fluid attenuation surrounding the pancreas. There is small area of  hypoattenuation in the pancreatic tail without a formed mass or cyst. The remainder the pancreas shows normal enhancement. Margins are mildly ill-defined. There is also a small amount of generalized ascites adjacent to the gallbladder, along the anterior perirenal spaces and adjacent to the liver. A small amount of free fluid collects in the posterior pelvic recess and tracks along the pericolic gutters.  Vascular: The portal vein, superior mesenteric vein and splenic vein are widely patent. The arteries of the abdomen are unremarkable.  Liver:  Diffuse fatty infiltration.  No mass or focal lesion.  Gallbladder and biliary tree: No evidence of gallstones. No gallbladder wall thickening. No bile duct dilation.  Spleen and adrenal glands:  Unremarkable.  Kidneys, ureters, bladder: 2.2 cm lower pole cyst in the left kidney. No other renal masses. No stones. No hydronephrosis. No ureteral stones or dilation. Bladder shows mild wall thickening but no mass or stone. Bladder is distended.  Lymph nodes:  No adenopathy.  Gastrointestinal: Evette Cristal of loops of proximal small bowel likely due to a mild localized adynamic ileus. No evidence of obstruction. No bowel wall thickening or evidence of bowel inflammation. Normal appendix visualized.  Musculoskeletal:  Unremarkable.  Since the prior study, the area of irregular hypoattenuation the pancreatic tail was noted. The amount of abdominal ascites has increased. No other change.  IMPRESSION: 1. Findings are consistent with pancreatitis. There is a small irregular area of hypoattenuation in the pancreatic tail which may reflect and early pseudocyst. This is new from the prior study. No convincing pancreatic necrosis. No evidence of venous thrombosis. 2. The amount of ascites has increased since prior exam. 3. Diffuse hepatic steatosis. 4. Bladder wall thickening of unclear etiology. This is similar to the prior exam. This could reflect an infectious or inflammatory cystitis. 5. Left  renal cyst, stable.   Electronically Signed   By: Amie Portland M.D.   On: 04/27/2015 09:52     Assessment/Plan Principal Problem: Acute pancreatitis  - According to the patient he had drank alcohol the night before the pain started. -CT scan not suggestive of gallstones, triglycerides checked in 2014 were only mildly elevated --we'll recheck them but pancreatitis is likely is secondary to ongoing alcohol abuse and possibly also fatty food intake -Nothing by mouth except for ice chips-aggressive hydration -Advised to completely avoid alcohol and fatty food - small fluid collection/ possibly developing pseudocyst- follow Lipase and symptoms  Active Problems:   Leukocytosis - likely stress response- follow    Nicotine abuse - does not smoke enough for a nicotine patch  Fatty liver - discussed with patient and advised to completely stop drinking to prevent cirrhosis  Bradycardia - asymptomatic  Homeless - prefers not to go to a shelter and appears [place]   Consulted:   Code Status: full code Family Communication:   DVT Prophylaxis:Lovenox  Time spent: 55 min  Nyonna Hargrove, MD [facility]  If 7PM-7AM, please contact night-coverage [email or web address] 04/27/2015, 12:10 PM

## 2015-04-27 NOTE — ED Provider Notes (Signed)
CSN: [id number]     Arrival date & time 04/27/15  0719 History   First MD Initiated Contact with Patient 04/27/15 [phone]     Chief Complaint  Patient presents with  . Nausea  . Emesis     (Consider location/radiation/quality/duration/timing/severity/associated sxs/prior Treatment) HPI Comments: Nausea vomiting epigastric pain similar to previous episodes of pancreatitis. Seen yesterday for same. States he felt better initially but then begin vomiting again around midnight. States he threw up about 15 times since then it is nonbilious and nonbloody. Denies diarrhea denies fever denies chest pain or shortness of breath. States last alcohol intake was 2 days ago. No previous abdominal surgeries. He reports taking nausea and pain medication without relief. Denies any urinary symptoms.  The history is provided by the patient and the EMS personnel.    Past Medical History  Diagnosis Date  . Abscess     hx of abscess on neck  . Alcohol abuse   . Cellulitis   . Hyponatremia   . Leukocytosis   . Pancreatitis   . Depression    Past Surgical History  Procedure Laterality Date  . Abscess removed from neck     Family History  Problem Relation Age of Onset  . Alcohol abuse Father    Social History  Substance Use Topics  . Smoking status: Current Every Day Smoker -- 1.00 packs/day for 1 years    Types: Cigarettes  . Smokeless tobacco: Never Used  . Alcohol Use: 21.0 oz/week    35 Cans of beer per week    Review of Systems  Constitutional: Negative for fever, activity change and appetite change.  HENT: Negative for congestion.   Respiratory: Negative for cough, chest tightness and shortness of breath.   Cardiovascular: Negative for chest pain.  Gastrointestinal: Positive for nausea, vomiting and abdominal pain. Negative for diarrhea.  Genitourinary: Negative for dysuria, urgency, hematuria and testicular pain.  Musculoskeletal: Negative for myalgias, back pain and arthralgias.   Skin: Negative for rash.  Neurological: Negative for dizziness, weakness, light-headedness and numbness.  A complete 10 system review of systems was obtained and all systems are negative except as noted in the HPI and PMH.      Allergies  Review of patient's allergies indicates no known allergies.  Home Medications   Prior to Admission medications   Medication Sig Start Date End Date Taking? Authorizing Provider  oxyCODONE-acetaminophen (PERCOCET/ROXICET) 5-325 MG per tablet Take 1-2 tablets by mouth every 6 (six) hours as needed for severe pain. 04/26/15  Yes Vanetta Mulders, MD  promethazine (PHENERGAN) 25 MG tablet Take 1 tablet (25 mg total) by mouth every 6 (six) hours as needed for nausea or vomiting. 04/26/15  Yes Vanetta Mulders, MD   BP 146/87 mmHg  Pulse 59  Temp(Src) 98.6 F (37 C) (Oral)  Resp 18  SpO2 100% Physical Exam  Constitutional: He is oriented to person, place, and time. He appears well-developed and well-nourished. No distress.  HENT:  Head: Normocephalic and atraumatic.  Mouth/Throat: Oropharynx is clear and moist. No oropharyngeal exudate.  Eyes: Conjunctivae and EOM are normal. Pupils are equal, round, and reactive to light.  Neck: Normal range of motion. Neck supple.  No meningismus.  Cardiovascular: Normal rate, regular rhythm, normal heart sounds and intact distal pulses.   No murmur heard. Pulmonary/Chest: Effort normal and breath sounds normal. No respiratory distress.  Abdominal: Soft. There is tenderness. There is no rebound and no guarding.  TTP epigastrium.  No guarding or rebound.  No  RUQ pain.  Negative Murphy's sign.  Musculoskeletal: Normal range of motion. He exhibits no edema or tenderness.  Neurological: He is alert and oriented to person, place, and time. No cranial nerve deficit. He exhibits normal muscle tone. Coordination normal.  No ataxia on finger to nose bilaterally. No pronator drift. 5/5 strength throughout. CN 2-12 intact.  Negative Romberg. Equal grip strength. Sensation intact. Gait is normal.   Skin: Skin is warm.  Psychiatric: He has a normal mood and affect. His behavior is normal.  Nursing note and vitals reviewed.   ED Course  Procedures (including critical care time) Labs Review Labs Reviewed  CBC WITH DIFFERENTIAL/PLATELET - Abnormal; Notable for the following:    WBC 14.4 (*)    Neutrophils Relative % 86 (*)    Neutro Abs 12.5 (*)    Lymphocytes Relative 5 (*)    Monocytes Absolute 1.2 (*)    All other components within normal limits  COMPREHENSIVE METABOLIC PANEL - Abnormal; Notable for the following:    Chloride 99 (*)    Glucose, Bld 120 (*)    Total Protein 8.7 (*)    All other components within normal limits  LIPASE, BLOOD - Abnormal; Notable for the following:    Lipase 674 (*)    All other components within normal limits  URINALYSIS, ROUTINE W REFLEX MICROSCOPIC (NOT AT [facility]) - Abnormal; Notable for the following:    Color, Urine AMBER (*)    Specific Gravity, Urine 1.031 (*)    Bilirubin Urine SMALL (*)    Ketones, ur 15 (*)    Protein, ur 100 (*)    Leukocytes, UA TRACE (*)    All other components within normal limits  URINE RAPID DRUG SCREEN, HOSP PERFORMED - Abnormal; Notable for the following:    Opiates POSITIVE (*)    Tetrahydrocannabinol POSITIVE (*)    All other components within normal limits  LACTATE DEHYDROGENASE - Abnormal; Notable for the following:    LDH 249 (*)    All other components within normal limits  CBC - Abnormal; Notable for the following:    WBC 14.0 (*)    All other components within normal limits  ETHANOL  URINE MICROSCOPIC-ADD ON  CREATININE, SERUM  HIV ANTIBODY (ROUTINE TESTING)  HEPATITIS PANEL, ACUTE  COMPREHENSIVE METABOLIC PANEL  CBC  LIPASE, BLOOD    Imaging Review Ct Abdomen Pelvis W Contrast  04/27/2015   CLINICAL DATA:  Homeless patient here with c/o nausea, vomiting x2 days. States that he was recently discharged from  hospital with diagnosis of pancreatitis.  EXAM: CT ABDOMEN AND PELVIS WITH CONTRAST  TECHNIQUE: Multidetector CT imaging of the abdomen and pelvis was performed using the standard protocol following bolus administration of intravenous contrast.  CONTRAST:  50mL OMNIPAQUE IOHEXOL 300 MG/ML SOLN, OMNIPAQUE IOHEXOL 300 MG/ML SOLN  COMPARISON:  02/13/2014  FINDINGS: Lung bases:  Clear.  Heart normal in size.  Pancreas call There is fluid attenuation surrounding the pancreas. There is small area of hypoattenuation in the pancreatic tail without a formed mass or cyst. The remainder the pancreas shows normal enhancement. Margins are mildly ill-defined. There is also a small amount of generalized ascites adjacent to the gallbladder, along the anterior perirenal spaces and adjacent to the liver. A small amount of free fluid collects in the posterior pelvic recess and tracks along the pericolic gutters.  Vascular: The portal vein, superior mesenteric vein and splenic vein are widely patent. The arteries of the abdomen are unremarkable.  Liver:  Diffuse fatty infiltration.  No mass or focal lesion.  Gallbladder and biliary tree: No evidence of gallstones. No gallbladder wall thickening. No bile duct dilation.  Spleen and adrenal glands:  Unremarkable.  Kidneys, ureters, bladder: 2.2 cm lower pole cyst in the left kidney. No other renal masses. No stones. No hydronephrosis. No ureteral stones or dilation. Bladder shows mild wall thickening but no mass or stone. Bladder is distended.  Lymph nodes:  No adenopathy.  Gastrointestinal: Evette Cristal of loops of proximal small bowel likely due to a mild localized adynamic ileus. No evidence of obstruction. No bowel wall thickening or evidence of bowel inflammation. Normal appendix visualized.  Musculoskeletal:  Unremarkable.  Since the prior study, the area of irregular hypoattenuation the pancreatic tail was noted. The amount of abdominal ascites has increased. No other change.   IMPRESSION: 1. Findings are consistent with pancreatitis. There is a small irregular area of hypoattenuation in the pancreatic tail which may reflect and early pseudocyst. This is new from the prior study. No convincing pancreatic necrosis. No evidence of venous thrombosis. 2. The amount of ascites has increased since prior exam. 3. Diffuse hepatic steatosis. 4. Bladder wall thickening of unclear etiology. This is similar to the prior exam. This could reflect an infectious or inflammatory cystitis. 5. Left renal cyst, stable.   Electronically Signed   By: Amie Portland M.D.   On: 04/27/2015 09:52   I, Manus Gunning, Kenyon Eshleman, personally reviewed and evaluated these images and lab results as part of my medical decision-making.   EKG Interpretation None      MDM   Final diagnoses:  Alcohol-induced acute pancreatitis   Epigastric pain with nausea and vomiting similar to previous episodes of pancreatitis. Vitals stable. No peritoneal signs.   Labs show leukocytosis. Ranson criteria negative.  Lipase 674, was 209 yesterday.  IVF, pain control, antiemetics. NPO  CT shows evidence of pancreatitis with possible early pseudocyst. Patient is an ongoing pain and nausea despite medications in the ED. We'll plan admission given his recurrent visits and elevating lipase. D/w Dr. Butler Denmark.  Glynn Octave, MD 04/27/15 1726

## 2015-04-27 NOTE — ED Notes (Signed)
Homeless patient here with c/o nausea, vomiting x2 days. States that he was recently discharged from hospital with diagnosis of pancreatitis.

## 2015-04-27 NOTE — ED Notes (Signed)
Bed: ZO10 Expected date:  Expected time:  Means of arrival:  Comments: EMS vomiting x 2 days; hx pf pancreatitis

## 2015-04-28 LAB — COMPREHENSIVE METABOLIC PANEL
ALT: 24 U/L (ref 17–63)
ANION GAP: 9 (ref 5–15)
AST: 24 U/L (ref 15–41)
Albumin: 3.4 g/dL — ABNORMAL LOW (ref 3.5–5.0)
Alkaline Phosphatase: 71 U/L (ref 38–126)
BUN: 9 mg/dL (ref 6–20)
CO2: 25 mmol/L (ref 22–32)
CREATININE: 0.75 mg/dL (ref 0.61–1.24)
Calcium: 8.5 mg/dL — ABNORMAL LOW (ref 8.9–10.3)
Chloride: 103 mmol/L (ref 101–111)
GFR calc Af Amer: >60 mL/min (ref >60)
Glucose, Bld: 69 mg/dL (ref 65–99)
Potassium: 3.5 mmol/L (ref 3.5–5.1)
Sodium: 137 mmol/L (ref 135–145)
TOTAL PROTEIN: 6.9 g/dL (ref 6.5–8.1)
Total Bilirubin: 1 mg/dL (ref 0.3–1.2)

## 2015-04-28 LAB — CBC
HCT: 41 % (ref 39.0–52.0)
Hemoglobin: 13.5 g/dL (ref 13.0–17.0)
MCH: 29.2 pg (ref 26.0–34.0)
MCHC: 32.9 g/dL (ref 30.0–36.0)
MCV: 88.6 fL (ref 78.0–100.0)
PLATELETS: 294 10*3/uL (ref 150–400)
RBC: 4.63 MIL/uL (ref 4.22–5.81)
RDW: 14.3 % (ref 11.5–15.5)
WBC: 15.1 10*3/uL — ABNORMAL HIGH (ref 4.0–10.5)

## 2015-04-28 LAB — HIV ANTIBODY (ROUTINE TESTING W REFLEX): HIV Screen 4th Generation wRfx: NONREACTIVE

## 2015-04-28 LAB — HEPATITIS PANEL, ACUTE
HEP B C IGM: NEGATIVE
HEP B S AG: NEGATIVE
Hep A IgM: NEGATIVE

## 2015-04-28 LAB — LIPASE, BLOOD: LIPASE: 134 U/L — AB (ref 22–51)

## 2015-04-28 NOTE — Progress Notes (Signed)
[facility] Progress Note   DAGAN HEINZ WUJ:[id number] DOB: 1980/09/22 DOA: 04/27/2015 PCP: No PCP Per Patient  Brief narrative: Antonio Alvarez is a 34 y.o. male with recent alcoholic pancreatitis hospitalized from 23 through 59 who presented to the hospital with acute pancreatitis again.   Subjective: Having more lower abdominal pain today but still has mid abdominal pain. Is getting nauseated when he is sipping on water. No vomiting. No diarrhea.  Assessment/Plan: Principal Problem:   Alcoholic pancreatitis -Although he has cut back on his drinking, he has not stopped completely- According to the patient he had drank beer the night before the pain started. -CT scan not suggestive of gallstones, triglycerides checked in 2014 were only mildly elevated --pancreatitis is likely is secondary to ongoing alcohol abuse and possibly also fatty food intake causing exacerbation -Advised to completely avoid alcohol and fatty food - small fluid collection/ possibly developing pseudocyst- follow Lipase and symptoms -Nothing by mouth except for ice chips-aggressive hydration- still having pain-continue when necessary morphine  Active Problems: Lower abdominal pain -Likely soreness due to vomiting for close to 24 hours-ordered heating pad   Leukocytosis - likely stress response- follow   Nicotine abuse - does not smoke enough for a nicotine patch  Fatty liver - discussed with patient and advised to completely stop drinking to prevent cirrhosis  Bradycardia - asymptomatic  Homeless - prefers not to go to a shelter and appears to be content with living under a bridge   Code Status: full code Family Communication:  DVT prophylaxis: lovenox Consultants: Procedures:  Antibiotics: Anti-infectives    None      Objective: Filed Weights   04/28/15 0800  Weight: 65.772 kg (145 lb)    Intake/Output Summary (Last 24 hours) at 04/28/15 1357 Last data filed at 04/27/15 1755   Gross per 24 hour  Intake      0 ml  Output      0 ml  Net      0 ml     Vitals Filed Vitals:   04/27/15 1432 04/27/15 2024 04/28/15 0602 04/28/15 0800  BP: 146/87 149/79 141/74   Pulse: 59 70 65   Temp: 98.6 F (37 C) 99.7 F (37.6 C) 99.1 F (37.3 C)   TempSrc: Oral Oral Oral   Resp: 18 16 18    Height:    5\' 11"  (1.803 m)  Weight:    65.772 kg (145 lb)  SpO2: 100% 97% 98%     Exam:  General:  Pt is alert, not in acute distress  HEENT: No icterus, No thrush, oral mucosa moist  Cardiovascular: regular rate and rhythm, S1/S2 No murmur  Respiratory: clear to auscultation bilaterally   Abdomen: Soft, +Bowel sounds, mid and lower abdominal tenderness, non distended, no guarding  MSK: No LE edema, cyanosis or clubbing  Data Reviewed: Basic Metabolic Panel:  Recent Labs Lab 04/26/15 0552 04/27/15 0751 04/27/15 1345 04/28/15 0526  NA 137 138  --  137  K 3.6 3.9  --  3.5  CL 99* 99*  --  103  CO2 25 29  --  25  GLUCOSE 129* 120*  --  69  BUN 10 12  --  9  CREATININE 1.09 0.97 0.73 0.75  CALCIUM 9.4 9.4  --  8.5*   Liver Function Tests:  Recent Labs Lab 04/26/15 0552 04/27/15 0751 04/28/15 0526  AST 54* 38 24  ALT 41 36 24  ALKPHOS 82 80 71  BILITOT 1.0 1.0 1.0  PROT 8.3* 8.7* 6.9  ALBUMIN 4.5 4.6 3.4*    Recent Labs Lab 04/26/15 0552 04/27/15 0751 04/28/15 0526  LIPASE 209* 674* 134*   No results for input(s): AMMONIA in the last 168 hours. CBC:  Recent Labs Lab 04/26/15 0552 04/27/15 0751 04/27/15 1345 04/28/15 0526  WBC 7.5 14.4* 14.0* 15.1*  NEUTROABS  --  12.5*  --   --   HGB 14.4 15.3 14.3 13.5  HCT 43.2 46.3 44.0 41.0  MCV 89.3 89.6 88.7 88.6  PLT 294 289 278 294   Cardiac Enzymes: No results for input(s): CKTOTAL, CKMB, CKMBINDEX, TROPONINI in the last 168 hours. BNP (last 3 results) No results for input(s): BNP in the last 8760 hours.  ProBNP (last 3 results) No results for input(s): PROBNP in the last 8760  hours.  CBG: No results for input(s): GLUCAP in the last 168 hours.  No results found for this or any previous visit (from the past 240 hour(s)).   Studies: Ct Abdomen Pelvis W Contrast  04/27/2015   CLINICAL DATA:  Homeless patient here with c/o nausea, vomiting x2 days. States that he was recently discharged from hospital with diagnosis of pancreatitis.  EXAM: CT ABDOMEN AND PELVIS WITH CONTRAST  TECHNIQUE: Multidetector CT imaging of the abdomen and pelvis was performed using the standard protocol following bolus administration of intravenous contrast.  CONTRAST:  50mL OMNIPAQUE IOHEXOL 300 MG/ML SOLN, OMNIPAQUE IOHEXOL 300 MG/ML SOLN  COMPARISON:  02/13/2014  FINDINGS: Lung bases:  Clear.  Heart normal in size.  Pancreas call There is fluid attenuation surrounding the pancreas. There is small area of hypoattenuation in the pancreatic tail without a formed mass or cyst. The remainder the pancreas shows normal enhancement. Margins are mildly ill-defined. There is also a small amount of generalized ascites adjacent to the gallbladder, along the anterior perirenal spaces and adjacent to the liver. A small amount of free fluid collects in the posterior pelvic recess and tracks along the pericolic gutters.  Vascular: The portal vein, superior mesenteric vein and splenic vein are widely patent. The arteries of the abdomen are unremarkable.  Liver:  Diffuse fatty infiltration.  No mass or focal lesion.  Gallbladder and biliary tree: No evidence of gallstones. No gallbladder wall thickening. No bile duct dilation.  Spleen and adrenal glands:  Unremarkable.  Kidneys, ureters, bladder: 2.2 cm lower pole cyst in the left kidney. No other renal masses. No stones. No hydronephrosis. No ureteral stones or dilation. Bladder shows mild wall thickening but no mass or stone. Bladder is distended.  Lymph nodes:  No adenopathy.  Gastrointestinal: Evette Cristal of loops of proximal small bowel likely due to a mild localized  adynamic ileus. No evidence of obstruction. No bowel wall thickening or evidence of bowel inflammation. Normal appendix visualized.  Musculoskeletal:  Unremarkable.  Since the prior study, the area of irregular hypoattenuation the pancreatic tail was noted. The amount of abdominal ascites has increased. No other change.  IMPRESSION: 1. Findings are consistent with pancreatitis. There is a small irregular area of hypoattenuation in the pancreatic tail which may reflect and early pseudocyst. This is new from the prior study. No convincing pancreatic necrosis. No evidence of venous thrombosis. 2. The amount of ascites has increased since prior exam. 3. Diffuse hepatic steatosis. 4. Bladder wall thickening of unclear etiology. This is similar to the prior exam. This could reflect an infectious or inflammatory cystitis. 5. Left renal cyst, stable.   Electronically Signed   By: Renard Hamper.D.  On: 04/27/2015 09:52    Scheduled Meds:  Scheduled Meds: . enoxaparin (LOVENOX) injection  40 mg Subcutaneous Daily   Continuous Infusions: . sodium chloride 150 mL/hr at 04/28/15 1610    Time spent on care of this patient: 35 min   Josaphine Shimamoto, MD 04/28/2015, 1:57 PM  LOS: 1 day   [facility] Office  [phone] Pager - Text Page per [email or web address] If 7PM-7AM, please contact night-coverage [email or web address]

## 2015-04-29 LAB — CBC
HCT: 38.2 % — ABNORMAL LOW (ref 39.0–52.0)
HEMOGLOBIN: 12.5 g/dL — AB (ref 13.0–17.0)
MCH: 29.1 pg (ref 26.0–34.0)
MCHC: 32.7 g/dL (ref 30.0–36.0)
MCV: 89 fL (ref 78.0–100.0)
Platelets: 292 10*3/uL (ref 150–400)
RBC: 4.29 MIL/uL (ref 4.22–5.81)
RDW: 14 % (ref 11.5–15.5)
WBC: 9.2 10*3/uL (ref 4.0–10.5)

## 2015-04-29 LAB — LIPASE, BLOOD: LIPASE: 62 U/L — AB (ref 22–51)

## 2015-04-29 MED ORDER — BOOST / RESOURCE BREEZE PO LIQD
1.0000 | Freq: Three times a day (TID) | ORAL | Status: DC
  Administered 2015-04-29 – 2015-05-04 (×14): 1 via ORAL

## 2015-04-29 NOTE — Progress Notes (Signed)
Initial Nutrition Assessment  INTERVENTION:   -Provided "Little Green Book" handout to patient, providing information about free meals in [place] -[employer] po TID, each supplement provides 250 kcal and 9 grams of protein -When diet advanced past clears, provide Ensure Enlive po BID, each supplement provides 350 kcal and 20 grams of protein -Encourage PO intake -RD to continue to monitor  NUTRITION DIAGNOSIS:   Inadequate oral intake related to social / environmental circumstances as evidenced by per patient/family report.  GOAL:   Patient will meet greater than or equal to 90% of their needs  MONITOR:   PO intake, Supplement acceptance, Labs, Weight trends, Skin, I & O's  REASON FOR ASSESSMENT:   Malnutrition Screening Tool    ASSESSMENT:   34 y.o. male with recent alcoholic pancreatitis hospitalized from 39 through 5 who presented to the hospital with acute pancreatitis again.   Pt reports good appetite and says he is ready for solid foods. Pt willing to try nutritional supplements. RD to order. Pt is currently homeless and reports not having consistent access to food. Per H&P, [place] bridge. Provided patient with "Little Green Book" handout to help patient reach free meal opportunities in the community.  Per weight history documentation, pt has lost 9 lb since 6/01(6% weight loss x 2.5 months, insignificant for time frame).  Nutrition focused physical exam shows no sign of depletion of muscle mass or body fat.  Labs reviewed.  Diet Order:  Diet clear liquid Room service appropriate?: Yes; Fluid consistency:: Thin  Skin:  Reviewed, no issues  Last BM:  8/10  Height:   Ht Readings from Last 1 Encounters:  04/28/15  (1.803 m)    Weight:   Wt Readings from Last 1 Encounters:  04/28/15 145 lb (65.772 kg)    Ideal Body Weight:  78.2 kg  BMI:  Body mass index is 20.23 kg/(m^2).  Estimated Nutritional Needs:   Kcal:   1950-2150  Protein:  105-115g  Fluid:  2L/day  EDUCATION NEEDS:   No education needs identified at this time  Tilda Franco, MS, RD, LDN Pager: [phone] After Hours Pager: [phone]

## 2015-04-29 NOTE — Clinical Social Work Note (Signed)
Clinical Social Work Assessment  Patient Details  Name: Antonio Alvarez MRN: [id number] Date of Birth: [birth year]  Date of referral:  04/29/15               Reason for consult:  Housing Concerns/Homelessness, Substance Use/ETOH Abuse                Permission sought to share information with:  Facility Art therapist granted to share information::  Yes, Verbal Permission Granted  Name::        Agency::     Relationship::     Contact Information:     Housing/Transportation Living arrangements for the past 2 months:  Homeless Source of Information:  Patient Patient Interpreter Needed:    Criminal Activity/Legal Involvement Pertinent to Current Situation/Hospitalization:    Significant Relationships:    Lives with:  Parents Do you feel safe going back to the place where you live?    Need for family participation in patient care:  No (Coment)  Care giving concerns:  No caregiver   Facilities manager / plan:  CSW met with pt at bedside to discuss services at discharge.  CSW prompted pt to discuss history and current needs.  CSW encouraged pt to explore thoughts and feelings related to alcohol rehab counseling services.  CSW provided homeless shelters.  CSW will leave handoff for week day CSW to follow up with rehab facilities for pt to work on his alcohol abuse.  Employment status:    Forensic scientist:   (no insurance) PT Recommendations:  Not assessed at this time Information / Referral to community resources:     Patient/Family's Response to care:  Pt very familiar with shelters and usually prefers to stay with friends or out in open.  Pt stated that he has tried outpatient counseling at [facility] center along time ago and "didn't like it".  Pt stated that he may try again.  Pt is interested in rehab for alcohol abuse and stated that he would like to know availability for this tomorrow. (facilities cannot be reached on weekend.)  Patient/Family's  Understanding of and Emotional Response to Diagnosis, Current Treatment, and Prognosis:  Pt appears to understand his diagnoses.  Stated that he is feeling better but would like to go to rehab for his alcohol problems.  Emotional Assessment Appearance:  Appears stated age Attitude/Demeanor/Rapport:   (cooperative) Affect (typically observed):  Accepting Orientation:  Oriented to Self, Oriented to Place, Oriented to  Time, Oriented to Situation Alcohol / Substance use:  Alcohol Use Psych involvement (Current and /or in the community):     Discharge Needs  Concerns to be addressed:    Readmission within the last 30 days:    Current discharge risk:    Barriers to Discharge:      Caswell Corwin 04/29/2015, 4:57 PM

## 2015-04-29 NOTE — Progress Notes (Signed)
[facility] Progress Note   Antonio Alvarez JXB:[id number] DOB: 1980-12-06 DOA: 04/27/2015 PCP: No PCP Per Patient  Brief narrative: Antonio Alvarez is a 34 y.o. male with recent alcoholic pancreatitis hospitalized from 20 through 67 who presented to the hospital with acute pancreatitis again.   Subjective: States that he is having mid and lower abdominal pain when drinking liquids. Has not had any nausea or vomiting. No bowel movement.  Assessment/Plan: Principal Problem:   Alcoholic pancreatitis -Although he has cut back on his drinking, he has not stopped completely- According to the patient he had drank beer the night before the pain started. -CT scan not suggestive of gallstones, triglycerides checked in 2014 were only mildly elevated --pancreatitis is likely is secondary to ongoing alcohol abuse and possibly also fatty food intake causing exacerbation -Advised to completely avoid alcohol and fatty food - small fluid collection/ possibly developing pseudocyst- follow Lipase and symptoms -Advanced to clear liquids last night as patient's nausea and pain had improved-although lipase has improved significantly down to 62, at this point will not advance diet further  Active Problems: Lower abdominal pain -Likely soreness due to vomiting for close to 24 hours-ordered heating pad   Leukocytosis - likely stress response versus secondary to pancreatic inflammation- improving   Nicotine abuse - does not smoke enough for a nicotine patch  Fatty liver - discussed with patient and advised to completely stop drinking to prevent cirrhosis -Hepatitis panel negative  Bradycardia - asymptomatic  Homeless - prefers not to go to a shelter and appears to be content with living under a bridge   Code Status: full code Family Communication:  DVT prophylaxis: lovenox Consultants: Procedures:  Antibiotics: Anti-infectives    None      Objective: Filed Weights   04/28/15 0800   Weight: 65.772 kg (145 lb)    Intake/Output Summary (Last 24 hours) at 04/29/15 1243 Last data filed at 04/28/15 1922  Gross per 24 hour  Intake    240 ml  Output      0 ml  Net    240 ml     Vitals Filed Vitals:   04/28/15 0800 04/28/15 1433 04/28/15 2108 04/29/15 0531  BP:  132/81 123/68 126/71  Pulse:  72 71 53  Temp:  98.4 F (36.9 C) 98.6 F (37 C) 98.3 F (36.8 C)  TempSrc:  Oral Oral Oral  Resp:  16 16 16   Height: 5\' 11"  (1.803 m)     Weight: 65.772 kg (145 lb)     SpO2:  99% 97% 98%    Exam:  General:  Pt is alert, not in acute distress  HEENT: No icterus, No thrush, oral mucosa moist  Cardiovascular: regular rate and rhythm, S1/S2 No murmur  Respiratory: clear to auscultation bilaterally   Abdomen: Soft, +Bowel sounds, mid and lower abdominal tenderness, non distended, no guarding  MSK: No LE edema, cyanosis or clubbing  Data Reviewed: Basic Metabolic Panel:  Recent Labs Lab 04/26/15 0552 04/27/15 0751 04/27/15 1345 04/28/15 0526  NA 137 138  --  137  K 3.6 3.9  --  3.5  CL 99* 99*  --  103  CO2 25 29  --  25  GLUCOSE 129* 120*  --  69  BUN 10 12  --  9  CREATININE 1.09 0.97 0.73 0.75  CALCIUM 9.4 9.4  --  8.5*   Liver Function Tests:  Recent Labs Lab 04/26/15 0552 04/27/15 0751 04/28/15 0526  AST 54* 38 24  ALT 41 36 24  ALKPHOS 82 80 71  BILITOT 1.0 1.0 1.0  PROT 8.3* 8.7* 6.9  ALBUMIN 4.5 4.6 3.4*    Recent Labs Lab 04/26/15 0552 04/27/15 0751 04/28/15 0526 04/29/15 0510  LIPASE 209* 674* 134* 62*   No results for input(s): AMMONIA in the last 168 hours. CBC:  Recent Labs Lab 04/26/15 0552 04/27/15 0751 04/27/15 1345 04/28/15 0526 04/29/15 0510  WBC 7.5 14.4* 14.0* 15.1* 9.2  NEUTROABS  --  12.5*  --   --   --   HGB 14.4 15.3 14.3 13.5 12.5*  HCT 43.2 46.3 44.0 41.0 38.2*  MCV 89.3 89.6 88.7 88.6 89.0  PLT 294 289 278 294 292   Cardiac Enzymes: No results for input(s): CKTOTAL, CKMB, CKMBINDEX, TROPONINI  in the last 168 hours. BNP (last 3 results) No results for input(s): BNP in the last 8760 hours.  ProBNP (last 3 results) No results for input(s): PROBNP in the last 8760 hours.  CBG: No results for input(s): GLUCAP in the last 168 hours.  No results found for this or any previous visit (from the past 240 hour(s)).   Studies: No results found.  Scheduled Meds:  Scheduled Meds: . enoxaparin (LOVENOX) injection  40 mg Subcutaneous Daily   Continuous Infusions: . sodium chloride 150 mL/hr (04/28/15 2200)    Time spent on care of this patient: 35 min   Samora Jernberg, MD 04/29/2015, 12:43 PM  LOS: 2 days   [facility] Office  [phone] Pager - Text Page per [email or web address] If 7PM-7AM, please contact night-coverage [email or web address]

## 2015-04-30 LAB — LIPASE, BLOOD
LIPASE: 81 U/L — AB (ref 22–51)
Lipase: 87 U/L — ABNORMAL HIGH (ref 22–51)

## 2015-04-30 MED ORDER — MORPHINE SULFATE 2 MG/ML IJ SOLN
1.0000 mg | INTRAMUSCULAR | Status: DC | PRN
  Administered 2015-04-30 – 2015-05-03 (×3): 1 mg via INTRAVENOUS
  Filled 2015-04-30 (×4): qty 1

## 2015-04-30 NOTE — Progress Notes (Signed)
[facility] Progress Note   GAMALIEL CHARNEY ZOX:[id number] DOB: Jan 24, 1981 DOA: 04/27/2015 PCP: No PCP Per Patient  Brief narrative: Antonio Alvarez is a 34 y.o. male with recent alcoholic pancreatitis hospitalized from 7 through 17 who presented to the hospital with acute pancreatitis again.   Subjective: Mid abdominal pain is not as bad now. No nausea no vomiting. He is still having lower abdominal discomfort.  Assessment/Plan: Principal Problem:   Alcoholic pancreatitis -Although he has cut back on his drinking, he has not stopped completely- According to the patient he had drank beer the night before the pain started. -CT scan not suggestive of gallstones, triglycerides checked in 2014 were only mildly elevated --pancreatitis is likely is secondary to ongoing alcohol abuse and possibly also fatty food intake causing exacerbation -Advised to completely avoid alcohol and fatty food - small fluid collection/ possibly developing pseudocyst - lipase slightly elevated today however, As mid abdominal pain is not increasing and leukocytosis is resolving on 2 days of clears, will increase to solid low-fat diet  Active Problems: Lower abdominal pain -Likely soreness due to vomiting for close to 24 hours-ordered heating pad-cutting back on IV morphine   Leukocytosis - likely stress response versus secondary to pancreatic inflammation- resolved today   Nicotine abuse - does not smoke enough for a nicotine patch  Fatty liver - discussed with patient and advised to completely stop drinking to prevent cirrhosis -Hepatitis panel negative  Bradycardia - asymptomatic  Homeless - prefers not to go to a shelter and appears to be content with living under a bridge- he may be changing his mind, Child psychotherapist speaking with him   Code Status: full code Family Communication:  DVT prophylaxis: lovenox Consultants: Procedures:  Antibiotics: Anti-infectives    None       Objective: Filed Weights   04/28/15 0800  Weight: 65.772 kg (145 lb)    Intake/Output Summary (Last 24 hours) at 04/30/15 1153 Last data filed at 04/30/15 0038  Gross per 24 hour  Intake 8919.5 ml  Output      0 ml  Net 8919.5 ml     Vitals Filed Vitals:   04/29/15 0531 04/29/15 1453 04/29/15 2058 04/30/15 0516  BP: 126/71 131/63 120/73 126/76  Pulse: 53 58 64 51  Temp: 98.3 F (36.8 C) 98.6 F (37 C) 98.4 F (36.9 C) 97.5 F (36.4 C)  TempSrc: Oral Oral Oral Oral  Resp: 16 16 16 16   Height:      Weight:      SpO2: 98% 100% 100% 99%    Exam:  General:  Pt is alert, not in acute distress  HEENT: No icterus, No thrush, oral mucosa moist  Cardiovascular: regular rate and rhythm, S1/S2 No murmur  Respiratory: clear to auscultation bilaterally   Abdomen: Soft, +Bowel sounds, mild lower abdominal tenderness, non distended, no guarding  MSK: No LE edema, cyanosis or clubbing  Data Reviewed: Basic Metabolic Panel:  Recent Labs Lab 04/26/15 0552 04/27/15 0751 04/27/15 1345 04/28/15 0526  NA 137 138  --  137  K 3.6 3.9  --  3.5  CL 99* 99*  --  103  CO2 25 29  --  25  GLUCOSE 129* 120*  --  69  BUN 10 12  --  9  CREATININE 1.09 0.97 0.73 0.75  CALCIUM 9.4 9.4  --  8.5*   Liver Function Tests:  Recent Labs Lab 04/26/15 0552 04/27/15 0751 04/28/15 0526  AST 54* 38 24  ALT  41 36 24  ALKPHOS 82 80 71  BILITOT 1.0 1.0 1.0  PROT 8.3* 8.7* 6.9  ALBUMIN 4.5 4.6 3.4*    Recent Labs Lab 04/27/15 0751 04/28/15 0526 04/29/15 0510 04/30/15 0505 04/30/15 1100  LIPASE 674* 134* 62* 81* 87*   No results for input(s): AMMONIA in the last 168 hours. CBC:  Recent Labs Lab 04/26/15 0552 04/27/15 0751 04/27/15 1345 04/28/15 0526 04/29/15 0510  WBC 7.5 14.4* 14.0* 15.1* 9.2  NEUTROABS  --  12.5*  --   --   --   HGB 14.4 15.3 14.3 13.5 12.5*  HCT 43.2 46.3 44.0 41.0 38.2*  MCV 89.3 89.6 88.7 88.6 89.0  PLT 294 289 278 294 292   Cardiac  Enzymes: No results for input(s): CKTOTAL, CKMB, CKMBINDEX, TROPONINI in the last 168 hours. BNP (last 3 results) No results for input(s): BNP in the last 8760 hours.  ProBNP (last 3 results) No results for input(s): PROBNP in the last 8760 hours.  CBG: No results for input(s): GLUCAP in the last 168 hours.  No results found for this or any previous visit (from the past 240 hour(s)).   Studies: No results found.  Scheduled Meds:  Scheduled Meds: . enoxaparin (LOVENOX) injection  40 mg Subcutaneous Daily  . feeding supplement  1 Container Oral TID BM   Continuous Infusions: . sodium chloride 75 mL/hr at 04/30/15 0038    Time spent on care of this patient: 35 min   Keerat Denicola, MD 04/30/2015, 11:53 AM  LOS: 3 days   [facility] Office  [phone] Pager - Text Page per [email or web address] If 7PM-7AM, please contact night-coverage [email or web address]

## 2015-05-01 ENCOUNTER — Inpatient Hospital Stay (HOSPITAL_COMMUNITY): Payer: Self-pay

## 2015-05-01 ENCOUNTER — Encounter (HOSPITAL_COMMUNITY): Payer: Self-pay | Admitting: Radiology

## 2015-05-01 LAB — BASIC METABOLIC PANEL
Anion gap: 6 (ref 5–15)
BUN: 6 mg/dL (ref 6–20)
CHLORIDE: 103 mmol/L (ref 101–111)
CO2: 31 mmol/L (ref 22–32)
Calcium: 9.1 mg/dL (ref 8.9–10.3)
Creatinine, Ser: 0.72 mg/dL (ref 0.61–1.24)
GFR calc Af Amer: >60 mL/min (ref >60)
GFR calc non Af Amer: >60 mL/min (ref >60)
Glucose, Bld: 94 mg/dL (ref 65–99)
POTASSIUM: 3.8 mmol/L (ref 3.5–5.1)
SODIUM: 140 mmol/L (ref 135–145)

## 2015-05-01 LAB — CBC
HEMATOCRIT: 37.7 % — AB (ref 39.0–52.0)
Hemoglobin: 12.4 g/dL — ABNORMAL LOW (ref 13.0–17.0)
MCH: 29 pg (ref 26.0–34.0)
MCHC: 32.9 g/dL (ref 30.0–36.0)
MCV: 88.3 fL (ref 78.0–100.0)
Platelets: 327 10*3/uL (ref 150–400)
RBC: 4.27 MIL/uL (ref 4.22–5.81)
RDW: 13.8 % (ref 11.5–15.5)
WBC: 4.5 10*3/uL (ref 4.0–10.5)

## 2015-05-01 LAB — LIPASE, BLOOD: Lipase: 96 U/L — ABNORMAL HIGH (ref 22–51)

## 2015-05-01 MED ORDER — IOHEXOL 300 MG/ML  SOLN
100.0000 mL | Freq: Once | INTRAMUSCULAR | Status: AC | PRN
  Administered 2015-05-01: 100 mL via INTRAVENOUS

## 2015-05-01 MED ORDER — IOHEXOL 300 MG/ML  SOLN
25.0000 mL | INTRAMUSCULAR | Status: AC
  Administered 2015-05-01 (×2): 25 mL via ORAL

## 2015-05-01 NOTE — Progress Notes (Addendum)
Clinical Social Work  CSW met with patient at bedside. CSW introduced myself and explained role. Patient had met with weekend CSW. Patient has been homeless for 8 years. 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 Patient stays with friends, in shelters, or on the streets. Patient reports he usually plans to work enough during the week to cover a hotel room. Patient agreeable to complete SBIRT. Patient reports he started drinking as a teenager. Patient has drank as much as a gallon of vodka a day but is currently drinking about 2-3 40 oz beers. 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 Patient reports he has been sober on his own and is not sure if treatment is needed. Patient reports if he is unable to work and secure housing then he might consider residential SA treatment. CSW provided information for intensive outpatient programs, AA meetings, and residential treatment. CSW will follow up with patient throughout hospital stay and can assist with scheduling an appointment if patient agreeable to sign ROI. Patient has attended [facility] meetings in [place] with his father. Patient does not want to go and stay in [place] because he does not get along with his father.   [place], R[place] [phone]

## 2015-05-01 NOTE — Progress Notes (Signed)
[facility] Progress Note   Antonio Alvarez WUJ:[id number] DOB: 1980-10-07 DOA: 04/27/2015 PCP: No PCP Per Patient  Brief narrative: Antonio Alvarez is a 34 y.o. male with recent alcoholic pancreatitis hospitalized from 45 through 33 who presented to the hospital with acute pancreatitis again.   Subjective: No longer having abdominal pain. Tolerating clear liquids. I was later called by RN and told he had abdominal pain earlier and required vicodin. He had more pain after a regular diet.   Assessment/Plan: Principal Problem:   Alcoholic pancreatitis -Although he has cut back on his drinking, he has not stopped completely- According to the patient he had drank beer the night before the pain started. -CT scan not suggestive of gallstones, triglycerides checked in 2014 were only mildly elevated --pancreatitis is likely is secondary to ongoing alcohol abuse and possibly also fatty food intake causing exacerbation -Advised to completely avoid alcohol and fatty food - small fluid collection/ possibly developing pseudocyst - lipase rising slowly- repeat CT does not show a pseudocyst but having pain after a solid meal- I skipped a full liquid diet due to fat content and told him to avoid fatty food- he had a hamburger today and had more mid abd pain- will go back to [facility] for today-  - cont IVF  Active Problems: Lower abdominal pain -Likely soreness due to vomiting for close to 24 hours-ordered heating pad- improving   Leukocytosis - likely stress response versus secondary to pancreatic inflammation- resolved     Nicotine abuse - does not smoke enough for a nicotine patch  Fatty liver - discussed with patient and advised to completely stop drinking to prevent cirrhosis -Hepatitis panel negative  Bradycardia - asymptomatic  Homeless - prefers not to go to a shelter and appears to be content with living under a bridge- he may be changing his mind, Child psychotherapist speaking with  him   Code Status: full code Family Communication:  DVT prophylaxis: lovenox Consultants: Procedures:  Antibiotics: Anti-infectives    None      Objective: Filed Weights   04/28/15 0800  Weight: 65.772 kg (145 lb)    Intake/Output Summary (Last 24 hours) at 05/01/15 1420 Last data filed at 05/01/15 0500  Gross per 24 hour  Intake 1668.75 ml  Output      0 ml  Net 1668.75 ml     Vitals Filed Vitals:   04/30/15 0516 04/30/15 1434 04/30/15 2146 05/01/15 0502  BP: 126/76 114/74 123/72 139/81  Pulse: 51 59 59 48  Temp: 97.5 F (36.4 C) 97.9 F (36.6 C) 98.4 F (36.9 C) 97.7 F (36.5 C)  TempSrc: Oral Oral Oral Oral  Resp: 16 16 16 18   Height:      Weight:      SpO2: 99% 98% 100% 100%    Exam:  General:  Pt is alert, not in acute distress  HEENT: No icterus, No thrush, oral mucosa moist  Cardiovascular: regular rate and rhythm, S1/S2 No murmur  Respiratory: clear to auscultation bilaterally   Abdomen: Soft, +Bowel sounds, did not have any abdominal tenderness on exam today, non distended, no guarding  MSK: No LE edema, cyanosis or clubbing  Data Reviewed: Basic Metabolic Panel:  Recent Labs Lab 04/26/15 0552 04/27/15 0751 04/27/15 1345 04/28/15 0526 05/01/15 0550  NA 137 138  --  137 140  K 3.6 3.9  --  3.5 3.8  CL 99* 99*  --  103 103  CO2 25 29  --  25 31  GLUCOSE 129* 120*  --  69 94  BUN 10 12  --  9 6  CREATININE 1.09 0.97 0.73 0.75 0.72  CALCIUM 9.4 9.4  --  8.5* 9.1   Liver Function Tests:  Recent Labs Lab 04/26/15 0552 04/27/15 0751 04/28/15 0526  AST 54* 38 24  ALT 41 36 24  ALKPHOS 82 80 71  BILITOT 1.0 1.0 1.0  PROT 8.3* 8.7* 6.9  ALBUMIN 4.5 4.6 3.4*    Recent Labs Lab 04/28/15 0526 04/29/15 0510 04/30/15 0505 04/30/15 1100 05/01/15 0550  LIPASE 134* 62* 81* 87* 96*   No results for input(s): AMMONIA in the last 168 hours. CBC:  Recent Labs Lab 04/27/15 0751 04/27/15 1345 04/28/15 0526 04/29/15 0510  05/01/15 0550  WBC 14.4* 14.0* 15.1* 9.2 4.5  NEUTROABS 12.5*  --   --   --   --   HGB 15.3 14.3 13.5 12.5* 12.4*  HCT 46.3 44.0 41.0 38.2* 37.7*  MCV 89.6 88.7 88.6 89.0 88.3  PLT 289 278 294 292 327   Cardiac Enzymes: No results for input(s): CKTOTAL, CKMB, CKMBINDEX, TROPONINI in the last 168 hours. BNP (last 3 results) No results for input(s): BNP in the last 8760 hours.  ProBNP (last 3 results) No results for input(s): PROBNP in the last 8760 hours.  CBG: No results for input(s): GLUCAP in the last 168 hours.  No results found for this or any previous visit (from the past 240 hour(s)).   Studies: Ct Abdomen Pelvis W Contrast  05/01/2015   CLINICAL DATA:  Diffuse abdominal pain. History of alcohol abuse. Evaluate for pancreatic pseudocyst. Subsequent encounter.  EXAM: CT ABDOMEN AND PELVIS WITH CONTRAST  TECHNIQUE: Multidetector CT imaging of the abdomen and pelvis was performed using the standard protocol following bolus administration of intravenous contrast.  CONTRAST:  OMNIPAQUE IOHEXOL 300 MG/ML  SOLN  COMPARISON:  CT 02/13/2014 and 04/27/2015.  FINDINGS: Lower chest: New small bilateral pleural effusions with dependent atelectasis at both lung bases. No pericardial effusion. There is a small hiatal hernia.  Hepatobiliary: Hepatic steatosis and hepatomegaly noted with the liver measuring up to 21.5 cm transverse and 20 cm in height. No focal hepatic lesion, abnormal enhancement or morphologic changes of cirrhosis identified. No evidence of gallstones, gallbladder wall thickening or biliary dilatation.  Pancreas: Previously demonstrated inflammatory changes surrounding the pancreatic body and tail have improved. Ill-defined low-density within the pancreatic tail has decreased in size, now measuring 12 mm on image 23. There is no well-defined pseudocyst. There is no pancreatic ductal dilatation, pancreatic parenchymal calcification or evidence of pancreatic necrosis.  Spleen:  Normal in size without focal abnormality.  Adrenals/Urinary Tract: Both adrenal glands appear normal. Low-density lesion in the lower pole of the left kidney measures higher than water density and may be slightly septated, although has not significantly enlarged from prior studies dating back to 02/15/2013. This currently measures 2.0 cm and is likely a complex cyst. The right kidney appears normal. No evidence of urinary tract calculus or hydronephrosis. The bladder is distended with diffuse wall thickening and probable trabeculation.  Stomach/Bowel: No evidence of bowel wall thickening, distention or surrounding inflammatory change. The appendix appears normal.  Vascular/Lymphatic: There are no enlarged abdominal or pelvic lymph nodes. No significant vascular findings are present. The splenic vein is patent.  Reproductive: Stable mild enlargement of the prostate gland.  Other: Ascites and mesenteric edema have improved compared with the prior study. No focal fluid collections identified.  Musculoskeletal: No acute or  significant osseous findings.  IMPRESSION: 1. Resolving changes of acute pancreatitis within the pancreatic body and tail. There is ill-defined inflammation within the pancreatic tail without well-defined pseudocyst. No evidence of pancreatic necrosis or splenic vein thrombosis. 2. New small bilateral pleural effusions. Ascites and mesenteric edema have improved. 3. Stable hepatomegaly and hepatic steatosis. 4. Low-density lesion in the lower pole of the left kidney has decreased in size from 2014 and is probably a complex cyst although was not seen on ultrasound performed 05/07/2013. 5. Persistent bladder wall thickening and distention, suggesting chronic bladder outlet obstruction.   Electronically Signed   By: Carey Bullocks M.D.   On: 05/01/2015 12:28    Scheduled Meds:  Scheduled Meds: . enoxaparin (LOVENOX) injection  40 mg Subcutaneous Daily  . feeding supplement  1 Container Oral TID  BM   Continuous Infusions: . sodium chloride 75 mL/hr at 04/30/15 1432    Time spent on care of this patient: 35 min   Lynda Capistran, MD 05/01/2015, 2:20 PM  LOS: 4 days   [facility] Office  [phone] Pager - Text Page per [email or web address] If 7PM-7AM, please contact night-coverage [email or web address]

## 2015-05-02 DIAGNOSIS — K852 Alcohol induced acute pancreatitis: Principal | ICD-10-CM

## 2015-05-02 DIAGNOSIS — R1013 Epigastric pain: Secondary | ICD-10-CM

## 2015-05-02 MED ORDER — PANTOPRAZOLE SODIUM 40 MG IV SOLR
40.0000 mg | Freq: Two times a day (BID) | INTRAVENOUS | Status: DC
  Administered 2015-05-02 – 2015-05-04 (×4): 40 mg via INTRAVENOUS
  Filled 2015-05-02 (×5): qty 40

## 2015-05-02 NOTE — Care Management Note (Signed)
Case Management Note  Patient Details  Name: Antonio Alvarez MRN: [id number] Date of Birth: 01-08-1981  Subjective/Objective:         34 yo admitted with Alcoholic pancreatitis           Action/Plan: Pt currently homeless.  Expected Discharge Date:                  Expected Discharge Plan:   Pending  In-House Referral:  Clinical Social Work  Discharge planning Services  CM Consult  Post Acute Care Choice:    Choice offered to:     DME Arranged:    DME Agency:     HH Arranged:    HH Agency:     Status of Service:  In process, will continue to follow  Medicare Important Message Given:    Date Medicare IM Given:    Medicare IM give by:    Date Additional Medicare IM Given:    Additional Medicare Important Message give by:     If discussed at Long Length of Stay Meetings, dates discussed:    Additional Comments: Chart reviewed and CM following for DC needs. Bartholome Bill, RN 05/02/2015, 2:02 PM

## 2015-05-02 NOTE — Progress Notes (Signed)
[facility] Progress Note   Antonio Alvarez WUJ:[id number] DOB: 1981-01-02 DOA: 04/27/2015 PCP: No PCP Per Patient  Brief narrative: Antonio Alvarez is a 34 y.o. male with recent alcoholic pancreatitis hospitalized from 79 through 32 who presented to the hospital with acute pancreatitis again.   Subjective: C/o intermittent epigastric pain, no vomiting on clear liquids. Reported feeling tired  Assessment/Plan: Principal Problem:   Alcoholic pancreatitis -Although he has cut back on his drinking, he has not stopped completely- According to the patient he had drank beer the night before the pain started. -CT scan on 8/12not suggestive of gallstones, triglycerides checked in 2014 were only mildly elevated --pancreatitis is likely is secondary to ongoing alcohol abuse and possibly also fatty food intake causing exacerbation -Advised to completely avoid alcohol and fatty food - small fluid collection/ possibly developing pseudocyst - lipase rising slowly- repeat CT ab on 8/16 does not show a pseudocyst but having pain after a solid meal- I skipped a full liquid diet due to fat content and told him to avoid fatty food- he had a hamburger today and had more mid abd pain- will go back to [facility] for today-  - cont IVF  Epigastric pain with h/o alcohol: gastritis/ esophagitis?  will start iv ppi, monitor effect  Active Problems: Lower abdominal pain -Likely soreness due to vomiting for close to 24 hours-ordered heating pad- improving   Leukocytosis - likely stress response versus secondary to pancreatic inflammation- resolved     Nicotine abuse - does not smoke enough for a nicotine patch  Fatty liver - discussed with patient and advised to completely stop drinking to prevent cirrhosis -Hepatitis panel negative  Bradycardia - asymptomatic  Homeless - prefers not to go to a shelter and appears to be content with living under a bridge- he may be changing his mind, Child psychotherapist  speaking with him  Alcohol use: interested in quit drinking   Code Status: full code Family Communication: patient DVT prophylaxis: lovenox Consultants:none Procedures: none  Antibiotics: Anti-infectives    None      Objective: Filed Weights   04/28/15 0800  Weight: 65.772 kg (145 lb)    Intake/Output Summary (Last 24 hours) at 05/02/15 1607 Last data filed at 05/02/15 0641  Gross per 24 hour  Intake 2166.25 ml  Output      0 ml  Net 2166.25 ml     Vitals Filed Vitals:   05/01/15 1500 05/01/15 2111 05/02/15 0532 05/02/15 1500  BP: 137/95 124/72 151/87 131/80  Pulse: 57 58 45 71  Temp: 98 F (36.7 C) 98.1 F (36.7 C) 98 F (36.7 C) 98.1 F (36.7 C)  TempSrc: Oral Oral Oral Oral  Resp: 18 18 18 18   Height:      Weight:      SpO2: 100% 100% 100% 100%    Exam:  General:  Pt is alert, not in acute distress  HEENT: No icterus, No thrush, oral mucosa moist  Cardiovascular: regular rate and rhythm, S1/S2 No murmur  Respiratory: clear to auscultation bilaterally   Abdomen: Soft, +Bowel sounds, epigastric tenderness, non distended, no guarding, no rebound  MSK: No LE edema, cyanosis or clubbing  Data Reviewed: Basic Metabolic Panel:  Recent Labs Lab 04/26/15 0552 04/27/15 0751 04/27/15 1345 04/28/15 0526 05/01/15 0550  NA 137 138  --  137 140  K 3.6 3.9  --  3.5 3.8  CL 99* 99*  --  103 103  CO2 25 29  --  25  31  GLUCOSE 129* 120*  --  69 94  BUN 10 12  --  9 6  CREATININE 1.09 0.97 0.73 0.75 0.72  CALCIUM 9.4 9.4  --  8.5* 9.1   Liver Function Tests:  Recent Labs Lab 04/26/15 0552 04/27/15 0751 04/28/15 0526  AST 54* 38 24  ALT 41 36 24  ALKPHOS 82 80 71  BILITOT 1.0 1.0 1.0  PROT 8.3* 8.7* 6.9  ALBUMIN 4.5 4.6 3.4*    Recent Labs Lab 04/28/15 0526 04/29/15 0510 04/30/15 0505 04/30/15 1100 05/01/15 0550  LIPASE 134* 62* 81* 87* 96*   No results for input(s): AMMONIA in the last 168 hours. CBC:  Recent Labs Lab  04/27/15 0751 04/27/15 1345 04/28/15 0526 04/29/15 0510 05/01/15 0550  WBC 14.4* 14.0* 15.1* 9.2 4.5  NEUTROABS 12.5*  --   --   --   --   HGB 15.3 14.3 13.5 12.5* 12.4*  HCT 46.3 44.0 41.0 38.2* 37.7*  MCV 89.6 88.7 88.6 89.0 88.3  PLT 289 278 294 292 327   Cardiac Enzymes: No results for input(s): CKTOTAL, CKMB, CKMBINDEX, TROPONINI in the last 168 hours. BNP (last 3 results) No results for input(s): BNP in the last 8760 hours.  ProBNP (last 3 results) No results for input(s): PROBNP in the last 8760 hours.  CBG: No results for input(s): GLUCAP in the last 168 hours.  No results found for this or any previous visit (from the past 240 hour(s)).   Studies: Ct Abdomen Pelvis W Contrast  05/01/2015   CLINICAL DATA:  Diffuse abdominal pain. History of alcohol abuse. Evaluate for pancreatic pseudocyst. Subsequent encounter.  EXAM: CT ABDOMEN AND PELVIS WITH CONTRAST  TECHNIQUE: Multidetector CT imaging of the abdomen and pelvis was performed using the standard protocol following bolus administration of intravenous contrast.  CONTRAST:  OMNIPAQUE IOHEXOL 300 MG/ML  SOLN  COMPARISON:  CT 02/13/2014 and 04/27/2015.  FINDINGS: Lower chest: New small bilateral pleural effusions with dependent atelectasis at both lung bases. No pericardial effusion. There is a small hiatal hernia.  Hepatobiliary: Hepatic steatosis and hepatomegaly noted with the liver measuring up to 21.5 cm transverse and 20 cm in height. No focal hepatic lesion, abnormal enhancement or morphologic changes of cirrhosis identified. No evidence of gallstones, gallbladder wall thickening or biliary dilatation.  Pancreas: Previously demonstrated inflammatory changes surrounding the pancreatic body and tail have improved. Ill-defined low-density within the pancreatic tail has decreased in size, now measuring 12 mm on image 23. There is no well-defined pseudocyst. There is no pancreatic ductal dilatation, pancreatic parenchymal  calcification or evidence of pancreatic necrosis.  Spleen: Normal in size without focal abnormality.  Adrenals/Urinary Tract: Both adrenal glands appear normal. Low-density lesion in the lower pole of the left kidney measures higher than water density and may be slightly septated, although has not significantly enlarged from prior studies dating back to 02/15/2013. This currently measures 2.0 cm and is likely a complex cyst. The right kidney appears normal. No evidence of urinary tract calculus or hydronephrosis. The bladder is distended with diffuse wall thickening and probable trabeculation.  Stomach/Bowel: No evidence of bowel wall thickening, distention or surrounding inflammatory change. The appendix appears normal.  Vascular/Lymphatic: There are no enlarged abdominal or pelvic lymph nodes. No significant vascular findings are present. The splenic vein is patent.  Reproductive: Stable mild enlargement of the prostate gland.  Other: Ascites and mesenteric edema have improved compared with the prior study. No focal fluid collections identified.  Musculoskeletal: No  acute or significant osseous findings.  IMPRESSION: 1. Resolving changes of acute pancreatitis within the pancreatic body and tail. There is ill-defined inflammation within the pancreatic tail without well-defined pseudocyst. No evidence of pancreatic necrosis or splenic vein thrombosis. 2. New small bilateral pleural effusions. Ascites and mesenteric edema have improved. 3. Stable hepatomegaly and hepatic steatosis. 4. Low-density lesion in the lower pole of the left kidney has decreased in size from 2014 and is probably a complex cyst although was not seen on ultrasound performed 05/07/2013. 5. Persistent bladder wall thickening and distention, suggesting chronic bladder outlet obstruction.   Electronically Signed   By: Carey Bullocks M.D.   On: 05/01/2015 12:28    Scheduled Meds:  Scheduled Meds: . enoxaparin (LOVENOX) injection  40 mg  Subcutaneous Daily  . feeding supplement  1 Container Oral TID BM   Continuous Infusions: . sodium chloride 75 mL/hr at 05/02/15 0641    Time spent on care of this patient: 25 min   Pharrah Rottman, MD PhD 05/02/2015, 4:07 PM  LOS: 5 days   [facility] Office  [phone] Pager - Text Page per [email or web address] If 7PM-7AM, please contact night-coverage [email or web address]

## 2015-05-03 LAB — CBC
HCT: 38.3 % — ABNORMAL LOW (ref 39.0–52.0)
HEMOGLOBIN: 12.8 g/dL — AB (ref 13.0–17.0)
MCH: 29.4 pg (ref 26.0–34.0)
MCHC: 33.4 g/dL (ref 30.0–36.0)
MCV: 88 fL (ref 78.0–100.0)
PLATELETS: 370 10*3/uL (ref 150–400)
RBC: 4.35 MIL/uL (ref 4.22–5.81)
RDW: 13.7 % (ref 11.5–15.5)
WBC: 5.7 10*3/uL (ref 4.0–10.5)

## 2015-05-03 LAB — COMPREHENSIVE METABOLIC PANEL
ALK PHOS: 65 U/L (ref 38–126)
ALT: 24 U/L (ref 17–63)
AST: 28 U/L (ref 15–41)
Albumin: 3.8 g/dL (ref 3.5–5.0)
Anion gap: 8 (ref 5–15)
BUN: 7 mg/dL (ref 6–20)
CALCIUM: 9.2 mg/dL (ref 8.9–10.3)
CHLORIDE: 100 mmol/L — AB (ref 101–111)
CO2: 29 mmol/L (ref 22–32)
CREATININE: 0.77 mg/dL (ref 0.61–1.24)
GFR calc Af Amer: >60 mL/min (ref >60)
Glucose, Bld: 86 mg/dL (ref 65–99)
Potassium: 3.8 mmol/L (ref 3.5–5.1)
SODIUM: 137 mmol/L (ref 135–145)
Total Bilirubin: 0.4 mg/dL (ref 0.3–1.2)
Total Protein: 7.4 g/dL (ref 6.5–8.1)

## 2015-05-03 LAB — LIPASE, BLOOD: Lipase: 69 U/L — ABNORMAL HIGH (ref 22–51)

## 2015-05-03 MED ORDER — HYDROCODONE-ACETAMINOPHEN 5-325 MG PO TABS
1.0000 | ORAL_TABLET | Freq: Three times a day (TID) | ORAL | Status: DC | PRN
  Administered 2015-05-03: 1 via ORAL
  Filled 2015-05-03: qty 1

## 2015-05-03 NOTE — Progress Notes (Signed)
[facility] Progress Note   Antonio Alvarez VWU:[id number] DOB: 1981-02-23 DOA: 04/27/2015 PCP: No PCP Per Patient  Brief narrative: Antonio Alvarez is a 34 y.o. male with recent alcoholic pancreatitis hospitalized from 3 through 10 who presented to the hospital with acute pancreatitis again.   Subjective: Feeling better, less epigastric pain, tolerating liquid diet, no n/v  Assessment/Plan: Principal Problem:   Alcoholic pancreatitis -Although he has cut back on his drinking, he has not stopped completely- According to the patient he had drank beer the night before the pain started. -CT scan on 8/12not suggestive of gallstones, triglycerides checked in 2014 were only mildly elevated --pancreatitis is likely is secondary to ongoing alcohol abuse and possibly also fatty food intake causing exacerbation -Advised to completely avoid alcohol and fatty food - small fluid collection/ possibly developing pseudocyst -having pain after a solid meal, repeat CT ab on 8/16 does not show a pseudocyst , back to liquid diet -better, advance diet to soft  Epigastric pain with h/o alcohol: gastritis/ esophagitis?  started iv ppi with good result, ,wil discharge on oral ppi  Active Problems: Lower abdominal pain -Likely soreness due to vomiting for close to 24 hours-ordered heating pad- improving   Leukocytosis - likely stress response versus secondary to pancreatic inflammation- resolved     Nicotine abuse - does not smoke enough for a nicotine patch  Fatty liver - discussed with patient and advised to completely stop drinking to prevent cirrhosis -Hepatitis panel negative -lft normalized.  Bradycardia - asymptomatic  Homeless - prefers not to go to a shelter and appears to be content with living [place] a bridge- he may be changing his mind, Child psychotherapist speaking with him  Alcohol use: interested in quit drinking   Code Status: full code Family Communication: patient DVT  prophylaxis: lovenox Consultants:none Procedures: none  Antibiotics: Anti-infectives    None      Objective: Filed Weights   04/28/15 0800  Weight: 145 lb (65.772 kg)   No intake or output data in the 24 hours ending 05/03/15 1153   Vitals Filed Vitals:   05/02/15 0532 05/02/15 1500 05/02/15 2127 05/03/15 0520  BP: 151/87 131/80 135/93 149/90  Pulse: 45 71 50 46  Temp: 98 F (36.7 C) 98.1 F (36.7 C) 99.4 F (37.4 C) 97.7 F (36.5 C)  TempSrc: Oral Oral Oral Oral  Resp: 18 18 18 18   Height:      Weight:      SpO2: 100% 100% 99% 100%    Exam:  General:  Pt is alert, not in acute distress  HEENT: No icterus, No thrush, oral mucosa moist  Cardiovascular: regular rate and rhythm, S1/S2 No murmur  Respiratory: clear to auscultation bilaterally   Abdomen: Soft, +Bowel sounds,less epigastric tenderness, non distended, no guarding, no rebound  MSK: No LE edema, cyanosis or clubbing  Data Reviewed: Basic Metabolic Panel:  Recent Labs Lab 04/27/15 0751 04/27/15 1345 04/28/15 0526 05/01/15 0550 05/03/15 0540  NA 138  --  137 140 137  K 3.9  --  3.5 3.8 3.8  CL 99*  --  103 103 100*  CO2 29  --  25 31 29   GLUCOSE 120*  --  69 94 86  BUN 12  --  9 6 7   CREATININE 0.97 0.73 0.75 0.72 0.77  CALCIUM 9.4  --  8.5* 9.1 9.2   Liver Function Tests:  Recent Labs Lab 04/27/15 0751 04/28/15 0526 05/03/15 0540  AST 38 24 28  ALT 36 24 24  ALKPHOS 80 71 65  BILITOT 1.0 1.0 0.4  PROT 8.7* 6.9 7.4  ALBUMIN 4.6 3.4* 3.8    Recent Labs Lab 04/29/15 0510 04/30/15 0505 04/30/15 1100 05/01/15 0550 05/03/15 0540  LIPASE 62* 81* 87* 96* 69*   No results for input(s): AMMONIA in the last 168 hours. CBC:  Recent Labs Lab 04/27/15 0751 04/27/15 1345 04/28/15 0526 04/29/15 0510 05/01/15 0550 05/03/15 0540  WBC 14.4* 14.0* 15.1* 9.2 4.5 5.7  NEUTROABS 12.5*  --   --   --   --   --   HGB 15.3 14.3 13.5 12.5* 12.4* 12.8*  HCT 46.3 44.0 41.0 38.2* 37.7*  38.3*  MCV 89.6 88.7 88.6 89.0 88.3 88.0  PLT 289 278 294 292 327 370   Cardiac Enzymes: No results for input(s): CKTOTAL, CKMB, CKMBINDEX, TROPONINI in the last 168 hours. BNP (last 3 results) No results for input(s): BNP in the last 8760 hours.  ProBNP (last 3 results) No results for input(s): PROBNP in the last 8760 hours.  CBG: No results for input(s): GLUCAP in the last 168 hours.  No results found for this or any previous visit (from the past 240 hour(s)).   Studies: Ct Abdomen Pelvis W Contrast  05/01/2015   CLINICAL DATA:  Diffuse abdominal pain. History of alcohol abuse. Evaluate for pancreatic pseudocyst. Subsequent encounter.  EXAM: CT ABDOMEN AND PELVIS WITH CONTRAST  TECHNIQUE: Multidetector CT imaging of the abdomen and pelvis was performed using the standard protocol following bolus administration of intravenous contrast.  CONTRAST:  OMNIPAQUE IOHEXOL 300 MG/ML  SOLN  COMPARISON:  CT 02/13/2014 and 04/27/2015.  FINDINGS: Lower chest: New small bilateral pleural effusions with dependent atelectasis at both lung bases. No pericardial effusion. There is a small hiatal hernia.  Hepatobiliary: Hepatic steatosis and hepatomegaly noted with the liver measuring up to 21.5 cm transverse and 20 cm in height. No focal hepatic lesion, abnormal enhancement or morphologic changes of cirrhosis identified. No evidence of gallstones, gallbladder wall thickening or biliary dilatation.  Pancreas: Previously demonstrated inflammatory changes surrounding the pancreatic body and tail have improved. Ill-defined low-density within the pancreatic tail has decreased in size, now measuring 12 mm on image 23. There is no well-defined pseudocyst. There is no pancreatic ductal dilatation, pancreatic parenchymal calcification or evidence of pancreatic necrosis.  Spleen: Normal in size without focal abnormality.  Adrenals/Urinary Tract: Both adrenal glands appear normal. Low-density lesion in the lower pole  of the left kidney measures higher than water density and may be slightly septated, although has not significantly enlarged from prior studies dating back to 02/15/2013. This currently measures 2.0 cm and is likely a complex cyst. The right kidney appears normal. No evidence of urinary tract calculus or hydronephrosis. The bladder is distended with diffuse wall thickening and probable trabeculation.  Stomach/Bowel: No evidence of bowel wall thickening, distention or surrounding inflammatory change. The appendix appears normal.  Vascular/Lymphatic: There are no enlarged abdominal or pelvic lymph nodes. No significant vascular findings are present. The splenic vein is patent.  Reproductive: Stable mild enlargement of the prostate gland.  Other: Ascites and mesenteric edema have improved compared with the prior study. No focal fluid collections identified.  Musculoskeletal: No acute or significant osseous findings.  IMPRESSION: 1. Resolving changes of acute pancreatitis within the pancreatic body and tail. There is ill-defined inflammation within the pancreatic tail without well-defined pseudocyst. No evidence of pancreatic necrosis or splenic vein thrombosis. 2. New small bilateral pleural effusions. Ascites  and mesenteric edema have improved. 3. Stable hepatomegaly and hepatic steatosis. 4. Low-density lesion in the lower pole of the left kidney has decreased in size from 2014 and is probably a complex cyst although was not seen on ultrasound performed 05/07/2013. 5. Persistent bladder wall thickening and distention, suggesting chronic bladder outlet obstruction.   Electronically Signed   By: Carey Bullocks M.D.   On: 05/01/2015 12:28    Scheduled Meds:  Scheduled Meds: . enoxaparin (LOVENOX) injection  40 mg Subcutaneous Daily  . feeding supplement  1 Container Oral TID BM  . pantoprazole (PROTONIX) IV  40 mg Intravenous Q12H   Continuous Infusions:    Time spent on care of this patient: 25  min   Shawn Carattini, MD PhD 05/03/2015, 11:53 AM  LOS: 6 days   [facility] Office  [phone] Pager - Text Page per [email or web address] If 7PM-7AM, please contact night-coverage [email or web address]

## 2015-05-04 DIAGNOSIS — K2970 Gastritis, unspecified, without bleeding: Secondary | ICD-10-CM

## 2015-05-04 DIAGNOSIS — F1910 Other psychoactive substance abuse, uncomplicated: Secondary | ICD-10-CM

## 2015-05-04 LAB — BASIC METABOLIC PANEL
ANION GAP: 8 (ref 5–15)
BUN: 11 mg/dL (ref 6–20)
CO2: 31 mmol/L (ref 22–32)
Calcium: 9.7 mg/dL (ref 8.9–10.3)
Chloride: 98 mmol/L — ABNORMAL LOW (ref 101–111)
Creatinine, Ser: 0.85 mg/dL (ref 0.61–1.24)
GFR calc Af Amer: >60 mL/min (ref >60)
GFR calc non Af Amer: >60 mL/min (ref >60)
GLUCOSE: 89 mg/dL (ref 65–99)
POTASSIUM: 3.9 mmol/L (ref 3.5–5.1)
Sodium: 137 mmol/L (ref 135–145)

## 2015-05-04 LAB — LIPASE, BLOOD: Lipase: 100 U/L — ABNORMAL HIGH (ref 22–51)

## 2015-05-04 LAB — MAGNESIUM: Magnesium: 1.8 mg/dL (ref 1.7–2.4)

## 2015-05-04 MED ORDER — OMEPRAZOLE 20 MG PO CPDR: 20.0000 mg | DELAYED_RELEASE_CAPSULE | Freq: Every day | ORAL | Status: DC

## 2015-05-04 NOTE — Discharge Summary (Signed)
Discharge Summary  Antonio Alvarez ZOX:[id number] DOB: August 14, 1981  PCP: No PCP Per Patient  Admit date: 04/27/2015 Discharge date: 05/04/2015  Time spent: <57mins  Recommendations for Outpatient Follow-up:  1. F/u with [facility]  Discharge Diagnoses:  Active Hospital Problems   Diagnosis Date Noted  . Alcoholic pancreatitis 02/13/2014  . Nicotine abuse 02/15/2013  . Leukocytosis 08/14/2011    Resolved Hospital Problems   Diagnosis Date Noted Date Resolved  . Acute pancreatitis 02/15/2013 04/27/2015    Discharge Condition: stable  Diet recommendation: low fat diet, avoid alcohol  Filed Weights   04/28/15 0800  Weight: 145 lb (65.772 kg)    History of present illness:  Antonio Alvarez is a 34 y.o. male with h/o alcohol abuse which he states he has cut down on, recent alcoholic pancreatitis for which she was admitted from 6/1 through 6/4 and nicotine abuse who is homeless. The patient states that the day before yesterday in the morning, he was out working in the heat and was drinking soda when he developed abdominal pain. He then ate a potato salad and with increase in pain and vomiting. He continued to have intermittent vomiting and pain and decided to come into the ER yesterday morning. I am unable to tell how he was treated however, after he was able to tolerate liquids, he was discharged home with prescriptions for Percocet and Phenergan. The patient states that he did not eat anything at all and went to sleep he woke up in the middle of the night with vomiting which continued all through the night. He tried to take some of the medications but continued to vomit and therefore presented to the ER. He has received morphine and Dilaudid and currently his pain is controlled. Pain was present in the mid abdomen and epigastric area. He is currently not feeling nauseated. He has not had any fevers or diarrhea. No bloody vomitus.  Hospital Course:    Principal Problem:   Alcoholic pancreatitis Active Problems:   Leukocytosis   Nicotine abuse  Alcoholic pancreatitis -Although he has cut back on his drinking, he has not stopped completely- According to the patient he had drank beer the night before the pain started. -CT scan on 8/12not suggestive of gallstones, triglycerides checked in 2014 were only mildly elevated --pancreatitis is likely is secondary to ongoing alcohol abuse and possibly also fatty food intake causing exacerbation -Advised to completely avoid alcohol and fatty food - small fluid collection/ possibly developing pseudocyst -having pain after a solid meal, repeat CT ab on 8/16 does not show a pseudocyst , back to liquid diet -better, advance diet to soft -continue to improve, tolerating diet advancement, discharge on 8/19   Epigastric pain with h/o alcohol: gastritis/ esophagitis? started iv ppi with good result, , discharge on oral ppi  Active Problems: Lower abdominal pain -Likely soreness due to vomiting for close to 24 hours-resolved   Leukocytosis - likely stress response versus secondary to pancreatic inflammation- resolved    Nicotine abuse - does not smoke enough for a nicotine patch  Fatty liver - discussed with patient and advised to completely stop drinking to prevent cirrhosis -Hepatitis panel negative -lft normalized.  Bradycardia - asymptomatic  Homeless - prefers not to go to a shelter and appears to be content with living [place] a bridge- Child psychotherapist  To continue to follow  Alcohol use: interested in quit drinking, social worker to assist in enrollment to residential treatment   Code  Status: full code Family Communication: patient and his aunt Consultants:none Procedures: none  Antibiotics: Anti-infectives    None           Discharge Exam: BP 121/66 mmHg  Pulse 49  Temp(Src) 97.6 F (36.4 C) (Oral)  Resp 20  Ht 5\' 11"  (1.803 m)  Wt 145 lb (65.772 kg)  BMI  20.23 kg/m2  SpO2 100%   General: Pt is alert, not in acute distress  HEENT: No icterus, No thrush, oral mucosa moist  Cardiovascular: regular rate and rhythm, S1/S2 No murmur  Respiratory: clear to auscultation bilaterally   Abdomen: Soft, +Bowel sounds,less epigastric tenderness, non distended, no guarding, no rebound  MSK: No LE edema, cyanosis or clubbing  Discharge Instructions You were cared for by a hospitalist during your hospital stay. If you have any questions about your discharge medications or the care you received while you were in the hospital after you are discharged, you can call the unit and asked to speak with the hospitalist on call if the hospitalist that took care of you is not available. Once you are discharged, your primary care physician will handle any further medical issues. Please note that NO REFILLS for any discharge medications will be authorized once you are discharged, as it is imperative that you return to your primary care physician (or establish a relationship with a primary care physician if you do not have one) for your aftercare needs so that they can reassess your need for medications and monitor your lab values.     Medication List    STOP taking these medications        oxyCODONE-acetaminophen 5-325 MG per tablet  Commonly known as:  PERCOCET/ROXICET     promethazine 25 MG tablet  Commonly known as:  PHENERGAN      TAKE these medications        omeprazole 20 MG capsule  Commonly known as:  PRILOSEC  Take 1 capsule (20 mg total) by mouth daily.       No Known Allergies     Follow-up Information    Follow up with [facility]     In 1 week.   Why:  hospital discharge follow up   Contact information:   [address] [facility] [address] [phone]       The results of significant diagnostics from this hospitalization (including imaging, microbiology, ancillary and  laboratory) are listed below for reference.    Significant Diagnostic Studies: Ct Abdomen Pelvis W Contrast  05/01/2015   CLINICAL DATA:  Diffuse abdominal pain. History of alcohol abuse. Evaluate for pancreatic pseudocyst. Subsequent encounter.  EXAM: CT ABDOMEN AND PELVIS WITH CONTRAST  TECHNIQUE: Multidetector CT imaging of the abdomen and pelvis was performed using the standard protocol following bolus administration of intravenous contrast.  CONTRAST:  OMNIPAQUE IOHEXOL 300 MG/ML  SOLN  COMPARISON:  CT 02/13/2014 and 04/27/2015.  FINDINGS: Lower chest: New small bilateral pleural effusions with dependent atelectasis at both lung bases. No pericardial effusion. There is a small hiatal hernia.  Hepatobiliary: Hepatic steatosis and hepatomegaly noted with the liver measuring up to 21.5 cm transverse and 20 cm in height. No focal hepatic lesion, abnormal enhancement or morphologic changes of cirrhosis identified. No evidence of gallstones, gallbladder wall thickening or biliary dilatation.  Pancreas: Previously demonstrated inflammatory changes surrounding the pancreatic body and tail have improved. Ill-defined low-density within the pancreatic tail has decreased in size, now measuring 12 mm on image  23. There is no well-defined pseudocyst. There is no pancreatic ductal dilatation, pancreatic parenchymal calcification or evidence of pancreatic necrosis.  Spleen: Normal in size without focal abnormality.  Adrenals/Urinary Tract: Both adrenal glands appear normal. Low-density lesion in the lower pole of the left kidney measures higher than water density and may be slightly septated, although has not significantly enlarged from prior studies dating back to 02/15/2013. This currently measures 2.0 cm and is likely a complex cyst. The right kidney appears normal. No evidence of urinary tract calculus or hydronephrosis. The bladder is distended with diffuse wall thickening and probable trabeculation.   Stomach/Bowel: No evidence of bowel wall thickening, distention or surrounding inflammatory change. The appendix appears normal.  Vascular/Lymphatic: There are no enlarged abdominal or pelvic lymph nodes. No significant vascular findings are present. The splenic vein is patent.  Reproductive: Stable mild enlargement of the prostate gland.  Other: Ascites and mesenteric edema have improved compared with the prior study. No focal fluid collections identified.  Musculoskeletal: No acute or significant osseous findings.  IMPRESSION: 1. Resolving changes of acute pancreatitis within the pancreatic body and tail. There is ill-defined inflammation within the pancreatic tail without well-defined pseudocyst. No evidence of pancreatic necrosis or splenic vein thrombosis. 2. New small bilateral pleural effusions. Ascites and mesenteric edema have improved. 3. Stable hepatomegaly and hepatic steatosis. 4. Low-density lesion in the lower pole of the left kidney has decreased in size from 2014 and is probably a complex cyst although was not seen on ultrasound performed 05/07/2013. 5. Persistent bladder wall thickening and distention, suggesting chronic bladder outlet obstruction.   Electronically Signed   By: Carey Bullocks M.D.   On: 05/01/2015 12:28   Ct Abdomen Pelvis W Contrast  04/27/2015   CLINICAL DATA:  Homeless patient here with c/o nausea, vomiting x2 days. States that he was recently discharged from hospital with diagnosis of pancreatitis.  EXAM: CT ABDOMEN AND PELVIS WITH CONTRAST  TECHNIQUE: Multidetector CT imaging of the abdomen and pelvis was performed using the standard protocol following bolus administration of intravenous contrast.  CONTRAST:  50mL OMNIPAQUE IOHEXOL 300 MG/ML SOLN, OMNIPAQUE IOHEXOL 300 MG/ML SOLN  COMPARISON:  02/13/2014  FINDINGS: Lung bases:  Clear.  Heart normal in size.  Pancreas call There is fluid attenuation surrounding the pancreas. There is small area of hypoattenuation in  the pancreatic tail without a formed mass or cyst. The remainder the pancreas shows normal enhancement. Margins are mildly ill-defined. There is also a small amount of generalized ascites adjacent to the gallbladder, along the anterior perirenal spaces and adjacent to the liver. A small amount of free fluid collects in the posterior pelvic recess and tracks along the pericolic gutters.  Vascular: The portal vein, superior mesenteric vein and splenic vein are widely patent. The arteries of the abdomen are unremarkable.  Liver:  Diffuse fatty infiltration.  No mass or focal lesion.  Gallbladder and biliary tree: No evidence of gallstones. No gallbladder wall thickening. No bile duct dilation.  Spleen and adrenal glands:  Unremarkable.  Kidneys, ureters, bladder: 2.2 cm lower pole cyst in the left kidney. No other renal masses. No stones. No hydronephrosis. No ureteral stones or dilation. Bladder shows mild wall thickening but no mass or stone. Bladder is distended.  Lymph nodes:  No adenopathy.  Gastrointestinal: Evette Cristal of loops of proximal small bowel likely due to a mild localized adynamic ileus. No evidence of obstruction. No bowel wall thickening or evidence of bowel inflammation. Normal appendix visualized.  Musculoskeletal:  Unremarkable.  Since the prior study, the area of irregular hypoattenuation the pancreatic tail was noted. The amount of abdominal ascites has increased. No other change.  IMPRESSION: 1. Findings are consistent with pancreatitis. There is a small irregular area of hypoattenuation in the pancreatic tail which may reflect and early pseudocyst. This is new from the prior study. No convincing pancreatic necrosis. No evidence of venous thrombosis. 2. The amount of ascites has increased since prior exam. 3. Diffuse hepatic steatosis. 4. Bladder wall thickening of unclear etiology. This is similar to the prior exam. This could reflect an infectious or inflammatory cystitis. 5. Left renal cyst,  stable.   Electronically Signed   By: Amie Portland M.D.   On: 04/27/2015 09:52    Microbiology: No results found for this or any previous visit (from the past 240 hour(s)).   Labs: Basic Metabolic Panel:  Recent Labs Lab 04/27/15 1345 04/28/15 0526 05/01/15 0550 05/03/15 0540 05/04/15 0550  NA  --  137 140 137 137  K  --  3.5 3.8 3.8 3.9  CL  --  103 103 100* 98*  CO2  --  25 31 29 31   GLUCOSE  --  69 94 86 89  BUN  --  9 6 7 11   CREATININE 0.73 0.75 0.72 0.77 0.85  CALCIUM  --  8.5* 9.1 9.2 9.7  MG  --   --   --   --  1.8   Liver Function Tests:  Recent Labs Lab 04/28/15 0526 05/03/15 0540  AST 24 28  ALT 24 24  ALKPHOS 71 65  BILITOT 1.0 0.4  PROT 6.9 7.4  ALBUMIN 3.4* 3.8    Recent Labs Lab 04/30/15 0505 04/30/15 1100 05/01/15 0550 05/03/15 0540 05/04/15 0550  LIPASE 81* 87* 96* 69* 100*   No results for input(s): AMMONIA in the last 168 hours. CBC:  Recent Labs Lab 04/27/15 1345 04/28/15 0526 04/29/15 0510 05/01/15 0550 05/03/15 0540  WBC 14.0* 15.1* 9.2 4.5 5.7  HGB 14.3 13.5 12.5* 12.4* 12.8*  HCT 44.0 41.0 38.2* 37.7* 38.3*  MCV 88.7 88.6 89.0 88.3 88.0  PLT 278 294 292 327 370   Cardiac Enzymes: No results for input(s): CKTOTAL, CKMB, CKMBINDEX, TROPONINI in the last 168 hours. BNP: BNP (last 3 results) No results for input(s): BNP in the last 8760 hours.  ProBNP (last 3 results) No results for input(s): PROBNP in the last 8760 hours.  CBG: No results for input(s): GLUCAP in the last 168 hours.     SignedAlbertine Grates MD, PhD  [facility] 05/04/2015, 10:52 AM

## 2015-05-04 NOTE — Progress Notes (Signed)
Discharge instructions given to pt, verbalized understanding. Left the unit in stable condition. 

## 2015-05-04 NOTE — Progress Notes (Signed)
Clinical Social Work  CSW spoke with patient who reports he is ready to DC. Patient has friend at bedside and plans to [place] back to the streets. Patient reports he and friend will rent a hotel or stay with friends. Patient reports he is still considering treatment at [facility]. Patient signed ROI form which was placed in chart. CSW faxed DC summary to [facility] who will review and assist with scheduling a follow up appointment. Patient reports that he call [facility] to follow up for appointment once he DC.  CSW is signing off but available if needed.  [place], [place] [phone]

## 2016-03-28 ENCOUNTER — Encounter (HOSPITAL_COMMUNITY): Payer: Self-pay | Admitting: Emergency Medicine

## 2016-03-28 ENCOUNTER — Observation Stay (HOSPITAL_COMMUNITY): Payer: MEDICAID

## 2016-03-28 ENCOUNTER — Inpatient Hospital Stay (HOSPITAL_COMMUNITY)
Admission: EM | Admit: 2016-03-28 | Discharge: 2016-03-31 | DRG: 439 | Disposition: A | Discharge status: Home / Self Care | Payer: Self-pay | Attending: Family Medicine | Admitting: Family Medicine

## 2016-03-28 DIAGNOSIS — F1010 Alcohol abuse, uncomplicated: Secondary | ICD-10-CM | POA: Diagnosis present

## 2016-03-28 DIAGNOSIS — E871 Hypo-osmolality and hyponatremia: Secondary | ICD-10-CM | POA: Diagnosis present

## 2016-03-28 DIAGNOSIS — R1013 Epigastric pain: Secondary | ICD-10-CM

## 2016-03-28 DIAGNOSIS — Z720 Tobacco use: Secondary | ICD-10-CM

## 2016-03-28 DIAGNOSIS — Z23 Encounter for immunization: Secondary | ICD-10-CM

## 2016-03-28 DIAGNOSIS — E860 Dehydration: Secondary | ICD-10-CM | POA: Diagnosis present

## 2016-03-28 DIAGNOSIS — F17210 Nicotine dependence, cigarettes, uncomplicated: Secondary | ICD-10-CM | POA: Diagnosis present

## 2016-03-28 DIAGNOSIS — K859 Acute pancreatitis without necrosis or infection, unspecified: Secondary | ICD-10-CM | POA: Diagnosis present

## 2016-03-28 DIAGNOSIS — K852 Alcohol induced acute pancreatitis without necrosis or infection: Principal | ICD-10-CM | POA: Insufficient documentation

## 2016-03-28 DIAGNOSIS — K860 Alcohol-induced chronic pancreatitis: Secondary | ICD-10-CM | POA: Diagnosis present

## 2016-03-28 DIAGNOSIS — R1114 Bilious vomiting: Secondary | ICD-10-CM | POA: Insufficient documentation

## 2016-03-28 DIAGNOSIS — Z590 Homelessness: Secondary | ICD-10-CM

## 2016-03-28 DIAGNOSIS — R109 Unspecified abdominal pain: Secondary | ICD-10-CM | POA: Diagnosis present

## 2016-03-28 HISTORY — DX: Bipolar disorder, unspecified: F31.9

## 2016-03-28 HISTORY — DX: Dorsalgia, unspecified: M54.9

## 2016-03-28 HISTORY — DX: Other chronic pain: G89.29

## 2016-03-28 HISTORY — DX: Insomnia, unspecified: G47.00

## 2016-03-28 HISTORY — DX: Anxiety disorder, unspecified: F41.9

## 2016-03-28 LAB — CBC
HEMATOCRIT: 44.5 % (ref 39.0–52.0)
HEMOGLOBIN: 14.8 g/dL (ref 13.0–17.0)
MCH: 29.1 pg (ref 26.0–34.0)
MCHC: 33.3 g/dL (ref 30.0–36.0)
MCV: 87.6 fL (ref 78.0–100.0)
Platelets: 322 10*3/uL (ref 150–400)
RBC: 5.08 MIL/uL (ref 4.22–5.81)
RDW: 14.2 % (ref 11.5–15.5)
WBC: 11.7 10*3/uL — ABNORMAL HIGH (ref 4.0–10.5)

## 2016-03-28 LAB — DIFFERENTIAL
BASOS ABS: 0 10*3/uL (ref 0.0–0.1)
Basophils Relative: 0 %
Eosinophils Absolute: 0 10*3/uL (ref 0.0–0.7)
Eosinophils Relative: 0 %
LYMPHS ABS: 0.7 10*3/uL (ref 0.7–4.0)
LYMPHS PCT: 6 %
MONOS PCT: 6 %
Monocytes Absolute: 0.7 10*3/uL (ref 0.1–1.0)
NEUTROS ABS: 10.3 10*3/uL — AB (ref 1.7–7.7)
NEUTROS PCT: 88 %

## 2016-03-28 LAB — COMPREHENSIVE METABOLIC PANEL
ALT: 26 U/L (ref 17–63)
ANION GAP: 12 (ref 5–15)
AST: 30 U/L (ref 15–41)
Albumin: 4.3 g/dL (ref 3.5–5.0)
Alkaline Phosphatase: 66 U/L (ref 38–126)
BUN: 11 mg/dL (ref 6–20)
CALCIUM: 9.5 mg/dL (ref 8.9–10.3)
CO2: 27 mmol/L (ref 22–32)
Chloride: 97 mmol/L — ABNORMAL LOW (ref 101–111)
Creatinine, Ser: 1.02 mg/dL (ref 0.61–1.24)
GLUCOSE: 133 mg/dL — AB (ref 65–99)
POTASSIUM: 4 mmol/L (ref 3.5–5.1)
Sodium: 136 mmol/L (ref 135–145)
TOTAL PROTEIN: 7.8 g/dL (ref 6.5–8.1)
Total Bilirubin: 1 mg/dL (ref 0.3–1.2)

## 2016-03-28 LAB — URINALYSIS, ROUTINE W REFLEX MICROSCOPIC
Bilirubin Urine: NEGATIVE
Glucose, UA: NEGATIVE mg/dL
Hgb urine dipstick: NEGATIVE
Ketones, ur: 15 mg/dL — AB
Leukocytes, UA: NEGATIVE
NITRITE: NEGATIVE
PH: 6 (ref 5.0–8.0)
Protein, ur: 30 mg/dL — AB
SPECIFIC GRAVITY, URINE: 1.023 (ref 1.005–1.030)

## 2016-03-28 LAB — LIPASE, BLOOD: LIPASE: 556 U/L — AB (ref 11–51)

## 2016-03-28 LAB — URINE MICROSCOPIC-ADD ON

## 2016-03-28 LAB — ETHANOL

## 2016-03-28 MED ORDER — FAMOTIDINE IN NACL 20-0.9 MG/50ML-% IV SOLN
20.0000 mg | Freq: Two times a day (BID) | INTRAVENOUS | Status: DC
  Administered 2016-03-28 – 2016-03-30 (×4): 20 mg via INTRAVENOUS
  Filled 2016-03-28 (×5): qty 50

## 2016-03-28 MED ORDER — ONDANSETRON HCL 4 MG/2ML IJ SOLN
4.0000 mg | Freq: Once | INTRAMUSCULAR | Status: AC
  Administered 2016-03-28: 4 mg via INTRAVENOUS
  Filled 2016-03-28: qty 2

## 2016-03-28 MED ORDER — ONDANSETRON HCL 4 MG/2ML IJ SOLN
4.0000 mg | Freq: Four times a day (QID) | INTRAMUSCULAR | Status: DC | PRN
  Administered 2016-03-28: 4 mg via INTRAVENOUS
  Filled 2016-03-28: qty 2

## 2016-03-28 MED ORDER — SODIUM CHLORIDE 0.9 % IV SOLN
INTRAVENOUS | Status: DC
  Administered 2016-03-28: 13:00:00 via INTRAVENOUS

## 2016-03-28 MED ORDER — MORPHINE SULFATE (PF) 2 MG/ML IV SOLN
1.0000 mg | INTRAVENOUS | Status: DC | PRN
  Administered 2016-03-28 – 2016-03-29 (×5): 1 mg via INTRAVENOUS
  Filled 2016-03-28 (×5): qty 1

## 2016-03-28 MED ORDER — DIATRIZOATE MEGLUMINE & SODIUM 66-10 % PO SOLN
ORAL | Status: AC
  Administered 2016-03-28: 17:00:00
  Filled 2016-03-28: qty 30

## 2016-03-28 MED ORDER — IOPAMIDOL (ISOVUE-300) INJECTION 61%
INTRAVENOUS | Status: AC
  Administered 2016-03-28: 100 mL
  Filled 2016-03-28: qty 100

## 2016-03-28 MED ORDER — TRAZODONE HCL 50 MG PO TABS
50.0000 mg | ORAL_TABLET | Freq: Every day | ORAL | Status: DC
  Administered 2016-03-28 – 2016-03-30 (×3): 50 mg via ORAL
  Filled 2016-03-28 (×3): qty 1

## 2016-03-28 MED ORDER — ONDANSETRON HCL 4 MG/2ML IJ SOLN
4.0000 mg | Freq: Once | INTRAMUSCULAR | Status: DC | PRN
  Filled 2016-03-28: qty 2

## 2016-03-28 MED ORDER — ENOXAPARIN SODIUM 40 MG/0.4ML ~~LOC~~ SOLN
40.0000 mg | SUBCUTANEOUS | Status: DC
  Administered 2016-03-28 – 2016-03-29 (×2): 40 mg via SUBCUTANEOUS
  Filled 2016-03-28 (×2): qty 0.4

## 2016-03-28 MED ORDER — HYDROMORPHONE HCL 1 MG/ML IJ SOLN
1.0000 mg | Freq: Once | INTRAMUSCULAR | Status: AC
  Administered 2016-03-28: 1 mg via INTRAVENOUS
  Filled 2016-03-28: qty 1

## 2016-03-28 MED ORDER — PNEUMOCOCCAL VAC POLYVALENT 25 MCG/0.5ML IJ INJ
0.5000 mL | INJECTION | INTRAMUSCULAR | Status: AC
  Administered 2016-03-29: 0.5 mL via INTRAMUSCULAR

## 2016-03-28 MED ORDER — SODIUM CHLORIDE 0.9 % IV BOLUS (SEPSIS)
1000.0000 mL | Freq: Once | INTRAVENOUS | Status: AC
Start: 2016-03-28 — End: 2016-03-28
  Administered 2016-03-28: 1000 mL via INTRAVENOUS

## 2016-03-28 MED ORDER — SODIUM CHLORIDE 0.9 % IV SOLN
INTRAVENOUS | Status: DC
  Administered 2016-03-28 – 2016-03-29 (×3): via INTRAVENOUS
  Administered 2016-03-29: 150 mL/h via INTRAVENOUS
  Administered 2016-03-30: 04:00:00 via INTRAVENOUS

## 2016-03-28 NOTE — ED Notes (Signed)
Attempted to call report to receiving RN at this time.

## 2016-03-28 NOTE — H&P (Signed)
Family Medicine Teaching [facility] Admission History and Physical Service Pager: [phone]  Patient name: Antonio Alvarez Medical record number: [id number] Date of birth: 1981/01/06 Age: 35 y.o. Gender: male  Primary Care Provider: No PCP Per Patient Consultants: none Code Status: FULL  Chief Complaint: vomiting  Assessment and Plan: Antonio Alvarez is a 35 y.o. male presenting with vomiting. PMH is significant for alcohol abuse, pancreatitis, depression, h/o hyponatremia.  Nausea/vomiting likely d/t alcoholic pancreatitis. Unlikely infectious etiology given afebrile and lack of systemic symptoms. Chronic EtOH abuse, drinks 5+ beers per day. Admitted with similar presentation in August 2016. Has had epigastric abdominal pain, bilious vomiting. Lipase 556 WBC 11.7 - Place in observation, under Dr McDiarmid - CT abd w constrast - IVF NS@150cc /hr - Keep NPO with ice chips - Pain control: morphine  IV q4prn - Repeat lipase in am - Zofran prn - Hepatitis panel  EtOH abuse drinks 5+ beers daily, last drink on 7/12. Does not currently appear to be in acute withdrawal. Has history of cocaine use documented, patient denied on admit. - UDS - CIWA protocol - HIV - hepatitis panel - monitor VS - EtOH level  Dehydration likely d/t vomiting - NS@150cc /hr - Monitor BMP  Leukocytosis WBC 11.7 on admit. Likely reactive - currently afebrile  H/o hyponatremia on admit 136 - monitor  H/o depression patient has difficult social situation, homeless, currently staying with a friend for past few days prior to admission. - currently stable, on no home medications - consult SW  FEN/GI: NPO with ice chips, NS@150cc /hr, famotidine Prophylaxis: lovenox  Disposition: pending medical management  History of Present Illness:  Antonio Alvarez is a 35 y.o. male presenting with vomiting and abdominal pain. Patient states started having throbbing/achy midepigastric abdominal pain starting on  Wednesday 03/26/16. He started having nausea and vomiting gradually last night, with associated diaphoresis. Describes vomitus as "enzyme green." Has not been able to tolerate PO. Abdominal pain is exacerbated by food. States had episode similar to this a year ago. States usually drinks 6-7 regular beers per day, last drink was on Wednesday; says stopped drinking liquor three years ago because it "messed up his liver." Denies fevers, chills, shakes, clammy, CP, diarrhea, constipation. Denies chest pain, SOB, difficulty urinating.   Review Of Systems: Per HPI otherwise the remainder of the systems were negative.  Patient Active Problem List   Diagnosis Date Noted  . Abdominal pain 03/28/2016  . Bilious vomiting with nausea   . Alcoholic pancreatitis 02/13/2014  . Tobacco abuse 02/13/2014  . Pancreatitis 02/13/2014  . Cocaine dependence (HCC) 09/09/2013  . Cannabis dependence (HCC) 09/09/2013  . Paranoid schizophrenia (HCC) 09/09/2013  . Substance induced mood disorder (HCC) 09/08/2013  . Auditory hallucinations 06/24/2013  . Nicotine abuse 02/15/2013  . Cellulitis of right foot 08/14/2011  . Hyponatremia 08/14/2011  . Leukocytosis 08/14/2011  . Dehydration 08/14/2011  . Alcohol abuse 08/14/2011    Past Medical History: Past Medical History  Diagnosis Date  . Abscess     hx of abscess on neck  . Alcohol abuse   . Cellulitis   . Hyponatremia   . Leukocytosis   . Pancreatitis   . Depression     Past Surgical History: Past Surgical History  Procedure Laterality Date  . Abscess removed from neck      Social History: Social History  Substance Use Topics  . Smoking status: Current Every Day Smoker -- 0.50 packs/day for 1 years    Types: Cigarettes  .  Smokeless tobacco: Never Used  . Alcohol Use: 21.0 oz/week    35 Cans of beer per week  Homeless. [place] living [place] bridge but has been staying at a friend's house for the past few days helping him remodel the house.  Smoking  0.5ppd since age 35.  Uses THC. Denies other illicit drug use.   Please also refer to relevant sections of EMR.  Family History: Family History  Problem Relation Age of Onset  . Alcohol abuse Father     Allergies and Medications: No Known Allergies No current facility-administered medications on file prior to encounter.   No current outpatient prescriptions on file prior to encounter.    Objective: BP 134/75 mmHg  Pulse 48  Temp(Src) 99.2 F (37.3 C) (Oral)  Resp 18  Ht 5\' 11"  (1.803 m)  Wt 133 lb 2.5 oz (60.4 kg)  BMI 18.58 kg/m2  SpO2 98%   Exam: General: lying in bed, in NAD Eyes: scleral icterus b/l, PERRL, EOMI ENTM: dry mucous membranes Cardiovascular: RRR, no murmurs appreciated Respiratory: CTAB, normal effort Abdomen: soft, TTP over mild epigastric area and L side, hypoactive bowel sounds, mild guarding. No rebound MSK: 5/5 strength Skin: no rashes noted Neuro: A&Ox3, no focal deficits Psych: appropriate affect  Labs and Imaging: CBC BMET   Recent Labs Lab 03/28/16 0930  WBC 11.7*  HGB 14.8  HCT 44.5  PLT 322    Recent Labs Lab 03/28/16 0930  NA 136  K 4.0  CL 97*  CO2 27  BUN 11  CREATININE 1.02  GLUCOSE 133*  CALCIUM 9.5     Lipase 556  Urinalysis    Component Value Date/Time   COLORURINE AMBER* 03/28/2016 1111   APPEARANCEUR CLEAR 03/28/2016 1111   LABSPEC 1.023 03/28/2016 1111   PHURINE 6.0 03/28/2016 1111   GLUCOSEU NEGATIVE 03/28/2016 1111   HGBUR NEGATIVE 03/28/2016 1111   BILIRUBINUR NEGATIVE 03/28/2016 1111   KETONESUR 15* 03/28/2016 1111   PROTEINUR 30* 03/28/2016 1111   UROBILINOGEN 0.2 04/27/2015 0734   NITRITE NEGATIVE 03/28/2016 1111   LEUKOCYTESUR NEGATIVE 03/28/2016 1111    Shaneen Reeser Shelbie HutchingJoseph Lorell Thibodaux, MD 03/28/2016, 5:43 PM PGY-1, [facility] FPTS Intern pager: [phone], text pages welcome  UPPER LEVEL ADDENDUM  I have read the above note and made revisions highlighted in  orange.  Tarri AbernethyAbigail J Abigal Choung, MD, MPH PGY-2 [facility] Pager [phone]

## 2016-03-28 NOTE — ED Notes (Signed)
Pt reports "cutting back" on daily ETOH use, reports no use since Wednesday. No tremors noted.

## 2016-03-28 NOTE — ED Notes (Signed)
Admitting MD at bedside.

## 2016-03-28 NOTE — Progress Notes (Signed)
Pt arrived to 6n23 from ED, mother at bedside, oriented to room and surroundings, denies nausea, c/o abd pain 8/10

## 2016-03-28 NOTE — ED Provider Notes (Signed)
CSN: [id number]     Arrival date & time 03/28/16  0815 History   First MD Initiated Contact with Patient 03/28/16 0825     Chief Complaint  Patient presents with  . Emesis  . Abdominal Pain    (Consider location/radiation/quality/duration/timing/severity/associated sxs/prior Treatment) HPI Comments: Patient with history of alcoholism, pancreatitis -- presents with complaints of abdominal pain, worsening epigastrium and non-bloody vomiting for the past 2 days. Vomiting has been green in color per patient. Pain radiates somewhat to the back. No fevers, URI symptoms, chest pain, shortness of breath, diarrhea, or urinary symptoms. Patient has been unable to tolerate any solids or liquids. Denies recent heavy NSAID use. States that he last drank 4 beers 2 days ago prior to the symptoms beginning. No other treatments prior to arrival. No history of abdominal surgeries. The onset of this condition was acute. The course is constant. Aggravating factors: none. Alleviating factors: none.    Patient is a 35 y.o. male presenting with vomiting and abdominal pain. The history is provided by the patient.  Emesis Associated symptoms: abdominal pain   Associated symptoms: no diarrhea, no headaches, no myalgias and no sore throat   Abdominal Pain Associated symptoms: nausea and vomiting   Associated symptoms: no chest pain, no cough, no diarrhea, no dysuria, no fever and no sore throat     Past Medical History  Diagnosis Date  . Abscess     hx of abscess on neck  . Alcohol abuse   . Cellulitis   . Hyponatremia   . Leukocytosis   . Pancreatitis   . Depression    Past Surgical History  Procedure Laterality Date  . Abscess removed from neck     Family History  Problem Relation Age of Onset  . Alcohol abuse Father    Social History  Substance Use Topics  . Smoking status: Current Every Day Smoker -- 0.50 packs/day for 1 years    Types: Cigarettes  . Smokeless tobacco: Never Used  . Alcohol  Use: 21.0 oz/week    35 Cans of beer per week    Review of Systems  Constitutional: Negative for fever.  HENT: Negative for rhinorrhea and sore throat.   Eyes: Negative for redness.  Respiratory: Negative for cough.   Cardiovascular: Negative for chest pain.  Gastrointestinal: Positive for nausea, vomiting and abdominal pain. Negative for diarrhea.  Genitourinary: Negative for dysuria.  Musculoskeletal: Negative for myalgias.  Skin: Negative for rash.  Neurological: Negative for headaches.    Allergies  Review of patient's allergies indicates no known allergies.  Home Medications   Prior to Admission medications   Not on File   BP 143/93 mmHg  Pulse 53  Temp(Src) 98.7 F (37.1 C) (Oral)  Resp 16  Ht 5\' 11"  (1.803 m)  Wt 63.504 kg  BMI 19.53 kg/m2  SpO2 99%   Physical Exam  Constitutional: He appears well-developed and well-nourished.  HENT:  Head: Normocephalic and atraumatic.  Eyes: Conjunctivae are normal. Right eye exhibits no discharge. Left eye exhibits no discharge.  Neck: Normal range of motion. Neck supple.  Cardiovascular: Normal rate, regular rhythm and normal heart sounds.   Pulmonary/Chest: Effort normal and breath sounds normal.  Abdominal: Soft. He exhibits no distension. There is tenderness in the epigastric area, periumbilical area and left upper quadrant. There is no rebound and no guarding.  Neurological: He is alert.  Skin: Skin is warm and dry.  Psychiatric: He has a normal mood and affect.  Nursing note and  vitals reviewed.   ED Course  Procedures (including critical care time) Labs Review Labs Reviewed  LIPASE, BLOOD - Abnormal; Notable for the following:    Lipase 556 (*)    All other components within normal limits  COMPREHENSIVE METABOLIC PANEL - Abnormal; Notable for the following:    Chloride 97 (*)    Glucose, Bld 133 (*)    All other components within normal limits  CBC - Abnormal; Notable for the following:    WBC 11.7 (*)     All other components within normal limits  URINALYSIS, ROUTINE W REFLEX MICROSCOPIC (NOT AT [facility]) - Abnormal; Notable for the following:    Color, Urine AMBER (*)    Ketones, ur 15 (*)    Protein, ur 30 (*)    All other components within normal limits  DIFFERENTIAL - Abnormal; Notable for the following:    Neutro Abs 10.3 (*)    All other components within normal limits  URINE MICROSCOPIC-ADD ON - Abnormal; Notable for the following:    Squamous Epithelial / LPF 0-5 (*)    Bacteria, UA RARE (*)    All other components within normal limits    I10:31 AM Patient seen and examined. Work-up initiated. Medications ordered.   Vital signs reviewed and are as follows: BP 143/93 mmHg  Pulse 53  Temp(Src) 98.7 F (37.1 C) (Oral)  Resp 16  Ht  (1.803 m)  Wt 63.504 kg  BMI 19.53 kg/m2  SpO2 99%  10:55 AM Pt updated on results. Fluids/pain medicines ordered. Will likely need admit for presumed alcoholic pancreatitis.   12:22 PM Spoke with [facility] who will admit. Patient is still very nauseous. Obs bed requested.   BP 118/75 mmHg  Pulse 50  Temp(Src) 98.7 F (37.1 C) (Oral)  Resp 16  Ht  (1.803 m)  Wt 63.504 kg  BMI 19.53 kg/m2  SpO2 94%    MDM   Final diagnoses:  Acute alcoholic pancreatitis   Admit for control of acute pancreatitis symptoms.     Renne Crigler, PA-C 03/28/16 1223  Lorre Nick, MD 03/29/16 [id number]

## 2016-03-28 NOTE — ED Notes (Signed)
Pt reports generalized abdominal pain x 2 days, vomiting since yesterday.  Pt denies diarrhea, fever or chills.

## 2016-03-29 DIAGNOSIS — K852 Alcohol induced acute pancreatitis without necrosis or infection: Principal | ICD-10-CM

## 2016-03-29 LAB — RAPID URINE DRUG SCREEN, HOSP PERFORMED
AMPHETAMINES: NOT DETECTED
BENZODIAZEPINES: NOT DETECTED
Barbiturates: NOT DETECTED
COCAINE: NOT DETECTED
OPIATES: NOT DETECTED
Tetrahydrocannabinol: POSITIVE — AB

## 2016-03-29 LAB — HIV ANTIBODY (ROUTINE TESTING W REFLEX): HIV Screen 4th Generation wRfx: NONREACTIVE

## 2016-03-29 LAB — HEPATITIS PANEL, ACUTE
HCV Ab: 0.1 s/co ratio (ref 0.0–0.9)
HEP B C IGM: NEGATIVE
HEP B S AG: NEGATIVE
Hep A IgM: NEGATIVE

## 2016-03-29 LAB — LIPASE, BLOOD: LIPASE: 217 U/L — AB (ref 11–51)

## 2016-03-29 MED ORDER — MORPHINE SULFATE (PF) 2 MG/ML IV SOLN
2.0000 mg | INTRAVENOUS | Status: DC | PRN
  Administered 2016-03-29 – 2016-03-30 (×4): 2 mg via INTRAVENOUS
  Filled 2016-03-29 (×4): qty 1

## 2016-03-29 NOTE — Progress Notes (Signed)
Family Medicine Teaching Service Daily Progress Note Intern Pager: [phone]  Patient name: Antonio Alvarez Medical record number: [id number] Date of birth: 03/10/1981 Age: 35 y.o. Gender: male  Primary Care Provider: No PCP Per Patient Consultants: none Code Status: FULL Pt Overview and Major Events to Date:  7/14: admitted due to excessive emesis  Assessment and Plan: Antonio Alvarez is a 35 y.o. male presenting with vomiting. PMH is significant for alcohol abuse, pancreatitis, depression, h/o hyponatremia.  Nausea/vomiting likely d/t alcoholic pancreatitis. Improved. CT abdomen with recurrent distal pancreatitis and extensive abdominal ascitis. One episode of emesis after contrast for CT. No further emesis. Lipase trended from 556 to 217. Continues to endorse epigastric pain from belly button up. He say morphine didn't help with pain. He received morphine 1 mg x4 over the last 24 hours.  - IVF NS@150cc /hr - Keep NPO with ice chips - Pain control: increased morphine from 1 mg to 2mg  q4h prn  - Zofran prn - Hepatitis panel pending.   EtOH abuse drinks 5+ beers daily, last drink on 7/12. EtOH neg. CIWA 0. UDS only THC - Continue CIWA  Dehydration likely d/t vomiting - Continue NS@150cc /hr - ADAT  Leukocytosis WBC 11.7 on admit. Likely reactive - continues to be afebrile  Social situation: homeless, currently staying with a friend for past few days prior to admission. - consulted SW  FEN/GI:  - NS@150cc /hr, famotidine -ADAT  Prophylaxis: lovenox Subjective:  Reports abdominal pain unchanged from yesterday. He says his pain is 8-9 out of 10. Denies improvements. However, he doesn't appear to be in that amount of pain. He received morphine 1 mg 4 in the last 24 hours. He says morphine is not helping with his pain. He wants Dilaudid. He denies nausea or vomiting except when he had a contrast for imaging. Denies any other problems  Objective: Temp:  [98.7 F (37.1 C)-99.2 F (37.3  C)] 98.9 F (37.2 C) (07/15 0525) Pulse Rate:  [44-61] 52 (07/15 0525) Resp:  [17-18] 18 (07/14 1656) BP: (118-159)/(75-102) 130/82 mmHg (07/15 0525) SpO2:  [89 %-100 %] 99 % (07/15 0525) Weight:  [133 lb 2.5 oz (60.4 kg)] 133 lb 2.5 oz (60.4 kg) (07/14 1656) Physical Exam: GEN: appears well, NAD. Sitting in bed with his girlfriend.  CVS: regular rate and rythm, normal s1 and s2, no murmurs, no edema RESP: no increased work of breathing, no crackles or wheeze GI: normal bowel sound, soft, mildly tender to palpation over his left upper quadrant, non-distended, no sign of ascites. No rebound tenderness or guarding.  NEURO: alert and oriented x4, no gross defecits  PSYCH: Normal mood and appropriate affect  Laboratory:  Recent Labs Lab 03/28/16 0930  WBC 11.7*  HGB 14.8  HCT 44.5  PLT 322    Recent Labs Lab 03/28/16 0930  NA 136  K 4.0  CL 97*  CO2 27  BUN 11  CREATININE 1.02  CALCIUM 9.5  PROT 7.8  BILITOT 1.0  ALKPHOS 66  ALT 26  AST 30  GLUCOSE 133*    Imaging/Diagnostic Tests: Ct Abdomen Pelvis W Contrast  03/28/2016  CLINICAL DATA:  Abdominal pain.  Pancreatitis. EXAM: CT ABDOMEN AND PELVIS WITH CONTRAST TECHNIQUE: Multidetector CT imaging of the abdomen and pelvis was performed using the standard protocol following bolus administration of intravenous contrast. CONTRAST:  100mL ISOVUE-300 IOPAMIDOL (ISOVUE-300) INJECTION 61% COMPARISON:  CT abdomen and pelvis 05/01/2015. FINDINGS: Lower chest: The lung bases are clear without focal nodule, mass, or airspace disease. Heart  size is normal. There is mild dilation of the distal esophagus. Hepatobiliary: Diffuse fatty infiltration of the liver is again noted. There is significant contour irregularities. No focal hepatic lesions are present. The common bile duct and gallbladder are within normal limits. Pancreas: Diffuse inflammatory changes are present about the distal pancreas. There is fluid inflammation along the  posterior perineum on the left. Inflammatory changes extend to the splenic flexure of the colon. No discrete cyst or abscess is present. Spleen: Within normal limits. Adrenals/Urinary Tract: The adrenal glands are normal bilaterally. A 2 cm low-density cystic structure is again noted at the lower pole of the left kidney. The kidneys are otherwise normal bilaterally. The urinary bladder is within normal limits. Mild diffuse wall thickening is again noted in the urinary bladder. Stomach/Bowel: The stomach is mildly distended. The proximal duodenum the somewhat distended as well. The duodenum is collapsed in the third and fourth segments adjacent to the pancreas. It inflammatory changes and fluid surround the fourth portion of the duodenum. The proximal small bowel is unremarkable. Contrast can be seen within the distal small bowel. The appendix is visualized and normal. The cecum is medial. The ascending transverse colon are within normal limits. Inflammatory changes at the splenic flexure are felt to be secondary. The distal descending and sigmoid colon are within normal limits. Vascular/Lymphatic: No significant vascular calcifications are present. No significant adenopathy is present. Reproductive: Within normal limits Other: Moderate abdominal ascites is noted. There is some fluid about the liver. Extensive fluid is present in the left pericolic gutter. Fluid extends into the anatomic pelvis. Musculoskeletal: Bone windows are unremarkable. No focal lytic or blastic lesions present. IMPRESSION: 1. Findings of recurrent distal pancreatitis with extensive abdominal ascites inflammatory change. No focal pseudocyst, pancreatic necrosis, or splenic vein thrombosis is present. 2. Stable 2 cm complex cyst at the lower pole of the left kidney. 3. Persistent hepatic steatosis. 4. Stable distention of urinary bladder with chronic wall thickening suggesting chronic outlet obstruction. Electronically Signed   By: Marin Roberts M.D.   On: 03/28/2016 20:30    Almon Hercules, MD 03/29/2016, 9:48 AM PGY-2, [facility] FPTS Intern pager: [phone], text pages welcome

## 2016-03-30 LAB — LIPASE, BLOOD: LIPASE: 109 U/L — AB (ref 11–51)

## 2016-03-30 MED ORDER — FAMOTIDINE 20 MG PO TABS
20.0000 mg | ORAL_TABLET | Freq: Two times a day (BID) | ORAL | Status: DC
  Administered 2016-03-30 – 2016-03-31 (×2): 20 mg via ORAL
  Filled 2016-03-30 (×2): qty 1

## 2016-03-30 MED ORDER — SODIUM CHLORIDE 0.9% FLUSH
3.0000 mL | Freq: Two times a day (BID) | INTRAVENOUS | Status: DC
Start: 2016-03-30 — End: 2016-03-31
  Administered 2016-03-30 – 2016-03-31 (×3): 3 mL via INTRAVENOUS

## 2016-03-30 MED ORDER — SODIUM CHLORIDE 0.9% FLUSH: 3.0000 mL | INTRAVENOUS | Status: DC | PRN

## 2016-03-30 MED ORDER — OXYCODONE HCL 5 MG PO TABS
5.0000 mg | ORAL_TABLET | ORAL | Status: DC | PRN
  Administered 2016-03-30 – 2016-03-31 (×3): 5 mg via ORAL
  Filled 2016-03-30 (×3): qty 1

## 2016-03-30 NOTE — Progress Notes (Signed)
Family Medicine Teaching Service Daily Progress Note Intern Pager: [phone]  Patient name: Antonio Alvarez Medical record number: [id number] Date of birth: 07/11/1981 Age: 35 y.o. Gender: male  Primary Care Provider: No PCP Per Patient Consultants: none Code Status: full  Pt Overview and Major Events to Date:  7/14: admitted with N/V due to pancreatitis  Assessment and Plan: Antonio Alvarez is a 35 y.o. male presenting with vomiting. PMH is significant for alcohol abuse, pancreatitis, depression, h/o hyponatremia.  Nausea/vomiting likely d/t alcoholic pancreatitis. Improved. CT abdomen with recurrent distal pancreatitis and extensive abdominal ascitis. Hepatitis panel and HIV negative. Lipase trend: 556 -> 217->109. Ethanol level negative. Got morphine 1 mg twice and 2 mg three times.  - Saline lock - ADAT - Pain control: oxy IR 5 mg q4h prn - Zofran prn  EtOH abuse drinks 5+ beers daily, last drink on 7/12. EtOH neg. No sign of withdrawal. UDS only THC - Continue CIWA  Dehydration likely d/t vomiting - Saline lock - ADAT  Leukocytosis WBC 11.7 on admit. Likely reactive - continues to be afebrile  Social situation: homeless, currently staying with a friend for past few days prior to admission. - consulted SW  FEN/GI:  - saline lock, famotidine - ADAT  Prophylaxis: Lovenox  Disposition: discharge tomorrow if continue to do well on PO pain meds  Subjective:  Reports tolerated meals. Ate bread and noodles last night. Denies nausea and vomiting. Pain improved compared to yesterday.   Objective: Temp:  [98.5 F (36.9 C)-99 F (37.2 C)] 98.7 F (37.1 C) (07/16 0613) Pulse Rate:  [50-59] 50 (07/16 0613) Resp:  [17-20] 17 (07/16 0613) BP: (121-131)/(69-83) 121/69 mmHg (07/16 0613) SpO2:  [99 %-100 %] 99 % (07/16 62130613) Physical Exam: GEN: appears well, NAD. Sitting in bed with his girlfriend.  CVS: regular rate and rythm, normal s1 and s2, no murmurs, no edema RESP: no  increased work of breathing, no crackles or wheeze GI: normal bowel sound, soft, no tenderness to palpation, non-distended, no sign of ascites. No rebound tenderness or guarding.  NEURO: alert and oriented x4, no gross defecits  PSYCH: Normal mood and appropriate affect  Laboratory:  Recent Labs Lab 03/28/16 0930  WBC 11.7*  HGB 14.8  HCT 44.5  PLT 322    Recent Labs Lab 03/28/16 0930  NA 136  K 4.0  CL 97*  CO2 27  BUN 11  CREATININE 1.02  CALCIUM 9.5  PROT 7.8  BILITOT 1.0  ALKPHOS 66  ALT 26  AST 30  GLUCOSE 133*   Imaging/Diagnostic Tests: No results found.  Almon Herculesaye T Xenia Nile, MD 03/30/2016, 6:38 AM PGY-2, [facility] FPTS Intern pager: [phone], text pages welcome

## 2016-03-31 NOTE — Discharge Summary (Signed)
[facility]  Patient name: Antonio LarocheRaphael P Millette Medical record number: [id number] Date of birth: 10/19/1980 Age: 35 y.o. Gender: male Date of Admission: 03/28/2016  Date of Discharge: 03/31/2016 Admitting Physician: Leighton Roachodd D McDiarmid, MD  Primary Care Provider: No PCP Per Patient Consultants: None  Indication for Hospitalization: Abdominal pain secondary to alcohol pancreatitis  Discharge Diagnoses/Problem List:  Acute on Chronic alcoholic pancreatitis   Disposition: Home  Discharge Condition: Stable  Discharge Exam:  General: lying in bed, in NAD Eyes: scleral icterus b/l, PERRL, EOMI ENTM: dry mucous membranes Cardiovascular: RRR, no murmurs appreciated Respiratory: CTAB, normal effort Abdomen: soft, non distended, not tender to palpation, No rebound or guarding  MSK: 5/5 strength Skin: no rashes noted Neuro: A&Ox3, no focal deficits Psych: appropriate affect  Brief Hospital Course:  Antonio Alvarez is a 35 y.o. male presenting with vomiting. PMH is significant for alcohol abuse, pancreatitis, depression, h/o hyponatremia.  #Nausea and Vomiting, resolved Secondary to alcoholic pancreatitis. Lipase 556 on admission, trended down to 109 on discharge day. CT abdomen revealed chronic distal pancreatitis. Patient made NPO, and pain was controlled with meds. Nausea and vomiting resolved on hospital day 2. Patient was stable, with adequate fluid resuscitation and diet was advanced. EtOH abuse drinks 5+ beers daily, last drink on 7/12. Does not currently appear to be in acute withdrawal. Has history of cocaine use documented, patient denied on admit. - UDS - CIWA protocol - HIV - hepatitis panel - monitor VS - EtOH level  #Dehydration  Secondary to emesis, patient received appropriate IV resuscitation and transition to oral.   #Leukocytosis WBC 11.7 on admit. Likely reactive. Patient did not display any sign of infection, fever, chills,  hypotension. Manage clinically, with gradual improvement.  #H/o hyponatremia on admit 136 - Stable thoughout inpatient stay  H/o depression patient has difficult social situation, homeless, currently staying with a friend for past few days prior to admission. - currently stable, on no home medications - consult SW  Issues for Follow Up:  1. Complex social situation with history of alcohol abuse. Needs to be connected with available resources  Significant Procedures:  CT Abdomen Pelvis  Significant Labs and Imaging:   Recent Labs Lab 03/28/16 0930  WBC 11.7*  HGB 14.8  HCT 44.5  PLT 322    Recent Labs Lab 03/28/16 0930  NA 136  K 4.0  CL 97*  CO2 27  GLUCOSE 133*  BUN 11  CREATININE 1.02  CALCIUM 9.5  ALKPHOS 66  AST 30  ALT 26  ALBUMIN 4.3    Results/Tests Pending at Time of Discharge: None  Discharge Medications:    Medication List    Notice    You have not been prescribed any medications.      Discharge Instructions: Please refer to Patient Instructions section of EMR for full details.  Patient was counseled important signs and symptoms that should prompt return to medical care, changes in medications, dietary instructions, activity restrictions, and follow up appointments.   Follow-Up Appointments:  Lovena NeighboursAbdoulaye Saben Donigan, MD 03/31/2016, 12:05 PM PGY-1, [facility]

## 2016-03-31 NOTE — Progress Notes (Signed)
CH responded to a spiritual consult for prayer. The patient was in good spirits. Patient is seeking strength to overcome addictions. I offered prayer, and am available for follow up as needed.  Lorne SkeensBenjamin T Steffie Waggoner    03/31/16 0900  Clinical Encounter Type  Visited With Patient  Visit Type Initial;Spiritual support  Referral From Nurse  Spiritual Encounters  Spiritual Needs Prayer;Emotional

## 2016-03-31 NOTE — Clinical Social Work Note (Addendum)
CSW received consult for patient needing information about substance abuse treatment centers.  CSW provided information about residential and outpatient substance abuse treatment centers.  CSW also provided bus pass for patient to discharge from hospital, CSW to sign off please reconsult if other social work needs arise.  Ervin KnackEric R. Sewell Pitner, MSW, Theresia MajorsLCSWA [phone] 03/31/2016 12:53 PM

## 2016-03-31 NOTE — Discharge Instructions (Addendum)
You were admitted for abdominal pain secondary to pancreatitis this caused by drinking alcohol. If you continue this current trend with your alcohol consumption, we will have more episodes of abdominal pain and more likely more severe. It would be beneficial and wise for your health to seek help to help control your alcohol consumption going forward.

## 2017-02-02 ENCOUNTER — Encounter: Payer: Self-pay | Admitting: Internal Medicine

## 2017-02-02 ENCOUNTER — Ambulatory Visit: Payer: Self-pay | Attending: Internal Medicine | Admitting: Internal Medicine

## 2017-02-02 VITALS — BP 126/82 | HR 73 | Temp 98.1°F | Resp 16 | Ht 71.0 in | Wt 153.0 lb

## 2017-02-02 DIAGNOSIS — Z720 Tobacco use: Secondary | ICD-10-CM

## 2017-02-02 DIAGNOSIS — Z1389 Encounter for screening for other disorder: Secondary | ICD-10-CM

## 2017-02-02 DIAGNOSIS — F1910 Other psychoactive substance abuse, uncomplicated: Secondary | ICD-10-CM | POA: Insufficient documentation

## 2017-02-02 DIAGNOSIS — Z5900 Homelessness unspecified: Secondary | ICD-10-CM | POA: Insufficient documentation

## 2017-02-02 DIAGNOSIS — Z1331 Encounter for screening for depression: Secondary | ICD-10-CM

## 2017-02-02 DIAGNOSIS — L669 Cicatricial alopecia, unspecified: Secondary | ICD-10-CM

## 2017-02-02 DIAGNOSIS — K029 Dental caries, unspecified: Secondary | ICD-10-CM

## 2017-02-02 DIAGNOSIS — F200 Paranoid schizophrenia: Secondary | ICD-10-CM

## 2017-02-02 DIAGNOSIS — R002 Palpitations: Secondary | ICD-10-CM | POA: Insufficient documentation

## 2017-02-02 NOTE — Progress Notes (Signed)
Patient ID: Antonio Alvarez, male    DOB: [birth year]  MRN: [id number]  CC: No chief complaint on file.   Subjective: Antonio Alvarez is a 36 y.o. male who presents to establish care.  Patient has a history of alcoholic pancreatitis, substance abuse disorder, tobacco dependence, paranoid schizophrenia, and homelessness. His concerns today include:  1.  Wants to sign up for [facility] card  2.  Reports occasional palpitations (can occur once a mth or less)  like "every blue moon."  Last less than a minute. -no CP/SOB/LE edema.   -occasional dizziness if he bends down and stands too quickly.  -+ anxious feeling when he does not have money  3.  Hx of Schizophrenia - reports being seen at [facility] in past and prescribed med but never took it.  -endorses auditory hallucinations at times -denies feelings of being down.    4.  Homeless.  [place] other people.  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 -single and has 9 children.  No relationship with them. -working with case worker, Antonio Alvarez, from [facility] to try to get housed.   5.  Substance Abuse -quit cocaine 2 yrs ago.  Endorses use of marijuana and drinks 2-3 beers a day (40 oz). -quit ETOH for 30 days one time in past. "I don't have to have it, I just be drinking it to drink it cause there is nothing else to do." -smokes 1/2 pk a day.  Smoked for 21 yrs. Had quit for 1 mth sometime ago. Not wanting to quit at this time.  Patient Active Problem List   Diagnosis Date Noted  . Homelessness 02/02/2017  . Polysubstance abuse 02/02/2017  . Alcoholic pancreatitis 02/13/2014  . Tobacco abuse 02/13/2014  . Cocaine dependence (HCC) 09/09/2013  . Cannabis dependence (HCC) 09/09/2013  . Paranoid schizophrenia (HCC) 09/09/2013  . Substance induced mood disorder (HCC) 09/08/2013  . Alcohol abuse 08/14/2011     No current outpatient prescriptions on file prior to visit.   No current facility-administered  medications on file prior to visit.     No Known Allergies  Social History   Social History  . Marital status: Single    Spouse name: N/A  . Number of children: 9  . Years of education: 12   Occupational History  . odd jobs    Social History Main Topics  . Smoking status: Current Every Day Smoker    Packs/day: 0.50    Years: 21.00    Types: Cigarettes  . Smokeless tobacco: Never Used  . Alcohol use 29.4 oz/week    49 Cans of beer per week     Comment: 03/28/2016 "average 7 beers/day"  . Drug use: No     Comment: 03/28/2016 quit cocaine per pt 2015  . Sexual activity: Yes   Other Topics Concern  . Not on file   Social History Narrative  . No narrative on file    Family History  Problem Relation Age of Onset  . Alcohol abuse Father     Past Surgical History:  Procedure Laterality Date  . INCISION AND DRAINAGE ABSCESS  X 3   "back, both sides of my neck"    ROS: Review of Systems  Constitutional: Negative for activity change, appetite change, fatigue and fever.  HENT: Positive for ear pain (occasionally). Negative for sore throat.   Respiratory: Negative for cough, chest tightness and shortness of breath.   Cardiovascular: Negative for chest pain.  Gastrointestinal: Negative for constipation and diarrhea.  Genitourinary: Negative for discharge and dysuria.       Sexually active with one male at this time.  Does not use condoms  Musculoskeletal: Positive for back pain (occasional).  Skin:       Hair loss on scalp.  Chronic for over 10 yrs.  Saw derm in past and reports no diagnosis was made.  Neurological: Negative for tremors, seizures and syncope.  Psychiatric/Behavioral: Hallucinations: auditory at times. The patient is nervous/anxious.     PHYSICAL EXAM: BP 126/82 (BP Location: Right Arm, Patient Position: Sitting, Cuff Size: Normal)   Pulse 73   Temp 98.1 F (36.7 C)   Resp 16   Ht 5\' 11"  (1.803 m)   Wt 153 lb (69.4 kg)   SpO2 97%   BMI 21.34  kg/m   Wt Readings from Last 3 Encounters:  02/02/17 153 lb (69.4 kg)  03/28/16 133 lb 2.5 oz (60.4 kg)  04/28/15 145 lb (65.8 kg)    Physical Exam General appearance - alert, well appearing, young to middle age male and in no distress Mental status - alert, oriented to person, place, and time, normal mood, behavior, speech, dress, motor activity, and thought processes Eyes - pupils equal and reactive, extraocular eye movements intact Ears - bilateral TM's and external ear canals normal Nose - normal and patent, no erythema, discharge or polyps Mouth - mucous membranes moist, pharynx normal without lesions. Cavity in LT lower 3rd molar Neck - no thyroid enlargement, no cervical LN Lymphatics -no cervical or axillary lymphadenopathy Chest -clear bilaterally without wheezes or crackles or rhonchi's Heart -regular rate and rhythm no gallops or murmurs appreciated Abdomen -soft nontender normal bowel sounds Neurological -cranial nerves grossly intact.  Power 5 out of 5 in all fours Extremities - no lower extremity edema Skin: Moderate scarring with patchy alopecia of the scalp  Dep[facility] screen [facility] 02/02/2017  Decreased Interest 2  Down, Depressed, Hopeless 0  PHQ - 2 Score 2  Altered sleeping 3  Tired, decreased energy 1  Change in appetite 0  Feeling bad or failure about yourself  0  Trouble concentrating 2  Moving slowly or fidgety/restless 0  PHQ-9 Score 8   GAD 7 : Generalized Anxiety Score 02/02/2017  Nervous, Anxious, on Edge 1  Control/stop worrying 0  Worry too much - different things 1  Trouble relaxing 2  Restless 3  Easily annoyed or irritable 1  Afraid - awful might happen 0  Total GAD 7 Score 8     ASSESSMENT AND PLAN: 1. Homelessness - CBC - Comprehensive metabolic panel - HIV antibody - Tdap vaccine greater than or equal to 7yo IM - future order.  Forgot to administer today so will be given on nurse only visit when pt returns this wk to meet with Ms  Leavy CellaBoyd. - Hepatitis C antibody -continue to work with his case manager to try to secure permanent housing -will see our financial counselor to [facility]  2. Dental cavity - Ambulatory referral to Dentistry  3. Scarring alopecia - Ambulatory referral to Dermatology  4. Polysubstance abuse -discussed ongoing health risk associated with excessive ETOH and street drug. Goals    . I will try to cut back to just one 40 oz beer a day.      5. Tobacco abuse -advised to quit, discussed health risks associated with smoking and smoking cessation methods.  Pt not ready to quit  6.  Paranoid schizophrenia (HCC) -given info about [facility] and encouraged to schedule apt  7. Palpitations -very infrequent and may be associated with his substance use - TSH  8. Positive depression screening -also has positive anxiety screen -discussed with pt.  He states he does not feel down or depressed at this time. -advised to be seen at [facility].  Info given   Cell [phone].  Pt says when lab results come in, if his cell phone not working, I can  Call his case worker who will then get in contact with him.  Do not give results to case worker. Patient was given the opportunity to ask questions.  Patient verbalized understanding of the plan and was able to repeat key elements of the plan.   Orders Placed This Encounter  Procedures  . Tdap vaccine greater than or equal to 7yo IM  . CBC  . Comprehensive metabolic panel  . TSH  . HIV antibody  . Hepatitis C antibody  . Ambulatory referral to Dentistry  . Ambulatory referral to Dermatology     Requested Prescriptions    No prescriptions requested or ordered in this encounter    Return if symptoms worsen or fail to improve.  Jonah Blue, MD, FACP

## 2017-02-02 NOTE — Patient Instructions (Signed)
Please give appointment with Ms. Leavy CellaBoyd so that he can sign up for [employer] [facility].   Patient Instructions: -please follow up if palpitations become more frequent. -[facility] given your history of schizophrenia. -You have set goal to cut back to just one 40 oz beer a day.

## 2017-02-03 ENCOUNTER — Telehealth: Payer: Self-pay

## 2017-02-03 LAB — COMPREHENSIVE METABOLIC PANEL
ALT: 20 IU/L (ref 0–44)
AST: 31 IU/L (ref 0–40)
Albumin/Globulin Ratio: 1.7 (ref 1.2–2.2)
Albumin: 4.9 g/dL (ref 3.5–5.5)
Alkaline Phosphatase: 95 IU/L (ref 39–117)
BUN/Creatinine Ratio: 13 (ref 9–20)
BUN: 14 mg/dL (ref 6–20)
Bilirubin Total: 0.4 mg/dL (ref 0.0–1.2)
CO2: 27 mmol/L (ref 18–29)
CREATININE: 1.1 mg/dL (ref 0.76–1.27)
Calcium: 9.8 mg/dL (ref 8.7–10.2)
Chloride: 99 mmol/L (ref 96–106)
GFR calc non Af Amer: 85 mL/min/{1.73_m2} (ref >59)
GFR, EST AFRICAN AMERICAN: 99 mL/min/{1.73_m2} (ref >59)
GLOBULIN, TOTAL: 2.9 g/dL (ref 1.5–4.5)
Glucose: 95 mg/dL (ref 65–99)
Potassium: 4.5 mmol/L (ref 3.5–5.2)
SODIUM: 140 mmol/L (ref 134–144)
TOTAL PROTEIN: 7.8 g/dL (ref 6.0–8.5)

## 2017-02-03 LAB — CBC
HEMATOCRIT: 43.3 % (ref 37.5–51.0)
HEMOGLOBIN: 14.8 g/dL (ref 13.0–17.7)
MCH: 29.7 pg (ref 26.6–33.0)
MCHC: 34.2 g/dL (ref 31.5–35.7)
MCV: 87 fL (ref 79–97)
Platelets: 339 10*3/uL (ref 150–379)
RBC: 4.98 x10E6/uL (ref 4.14–5.80)
RDW: 14.3 % (ref 12.3–15.4)
WBC: 6.8 10*3/uL (ref 3.4–10.8)

## 2017-02-03 LAB — HEPATITIS C ANTIBODY

## 2017-02-03 LAB — TSH: TSH: 1.38 u[IU]/mL (ref 0.450–4.500)

## 2017-02-03 LAB — HIV ANTIBODY (ROUTINE TESTING W REFLEX): HIV SCREEN 4TH GENERATION: NONREACTIVE

## 2017-02-03 NOTE — Telephone Encounter (Signed)
Cll pt - spoke with his case manager - left message for him to call the office for his lab results.

## 2017-02-03 NOTE — Telephone Encounter (Signed)
-----   Message from Marcine Matareborah B Johnson, MD sent at 02/03/2017  3:00 PM EDT ----- Please let patient know that hepatitis C and HIV screens were negative.  Kidney and liver function tests, thyroid level and blood count were normal.

## 2017-02-06 ENCOUNTER — Ambulatory Visit: Payer: Self-pay

## 2017-02-16 ENCOUNTER — Ambulatory Visit: Payer: Self-pay

## 2017-03-09 ENCOUNTER — Ambulatory Visit: Payer: Self-pay | Attending: Internal Medicine

## 2017-07-05 ENCOUNTER — Encounter (HOSPITAL_COMMUNITY): Payer: Self-pay | Admitting: Emergency Medicine

## 2017-07-05 ENCOUNTER — Emergency Department (HOSPITAL_COMMUNITY): Payer: Self-pay

## 2017-07-05 ENCOUNTER — Inpatient Hospital Stay (HOSPITAL_COMMUNITY)
Admission: EM | Admit: 2017-07-05 | Discharge: 2017-07-07 | DRG: 439 | Disposition: A | Discharge status: Home / Self Care | Payer: Self-pay | Attending: Internal Medicine | Admitting: Internal Medicine

## 2017-07-05 DIAGNOSIS — L659 Nonscarring hair loss, unspecified: Secondary | ICD-10-CM | POA: Diagnosis present

## 2017-07-05 DIAGNOSIS — F1910 Other psychoactive substance abuse, uncomplicated: Secondary | ICD-10-CM | POA: Diagnosis present

## 2017-07-05 DIAGNOSIS — F10239 Alcohol dependence with withdrawal, unspecified: Secondary | ICD-10-CM | POA: Diagnosis present

## 2017-07-05 DIAGNOSIS — K5900 Constipation, unspecified: Secondary | ICD-10-CM | POA: Diagnosis present

## 2017-07-05 DIAGNOSIS — K852 Alcohol induced acute pancreatitis without necrosis or infection: Principal | ICD-10-CM

## 2017-07-05 DIAGNOSIS — Z811 Family history of alcohol abuse and dependence: Secondary | ICD-10-CM

## 2017-07-05 DIAGNOSIS — F1010 Alcohol abuse, uncomplicated: Secondary | ICD-10-CM | POA: Diagnosis present

## 2017-07-05 DIAGNOSIS — R339 Retention of urine, unspecified: Secondary | ICD-10-CM

## 2017-07-05 DIAGNOSIS — K760 Fatty (change of) liver, not elsewhere classified: Secondary | ICD-10-CM | POA: Diagnosis present

## 2017-07-05 DIAGNOSIS — L669 Cicatricial alopecia, unspecified: Secondary | ICD-10-CM | POA: Diagnosis present

## 2017-07-05 DIAGNOSIS — R188 Other ascites: Secondary | ICD-10-CM | POA: Diagnosis present

## 2017-07-05 DIAGNOSIS — K2970 Gastritis, unspecified, without bleeding: Secondary | ICD-10-CM | POA: Diagnosis present

## 2017-07-05 DIAGNOSIS — F200 Paranoid schizophrenia: Secondary | ICD-10-CM | POA: Diagnosis present

## 2017-07-05 DIAGNOSIS — F17210 Nicotine dependence, cigarettes, uncomplicated: Secondary | ICD-10-CM | POA: Diagnosis present

## 2017-07-05 DIAGNOSIS — Z720 Tobacco use: Secondary | ICD-10-CM | POA: Diagnosis present

## 2017-07-05 DIAGNOSIS — K859 Acute pancreatitis without necrosis or infection, unspecified: Secondary | ICD-10-CM | POA: Diagnosis present

## 2017-07-05 DIAGNOSIS — Z5900 Homelessness unspecified: Secondary | ICD-10-CM

## 2017-07-05 LAB — RAPID URINE DRUG SCREEN, HOSP PERFORMED
Amphetamines: NOT DETECTED
BARBITURATES: NOT DETECTED
Benzodiazepines: NOT DETECTED
COCAINE: NOT DETECTED
Opiates: NOT DETECTED
TETRAHYDROCANNABINOL: NOT DETECTED

## 2017-07-05 LAB — COMPREHENSIVE METABOLIC PANEL
ALK PHOS: 79 U/L (ref 38–126)
ALT: 31 U/L (ref 17–63)
AST: 34 U/L (ref 15–41)
Albumin: 4.5 g/dL (ref 3.5–5.0)
Anion gap: 10 (ref 5–15)
BILIRUBIN TOTAL: 0.6 mg/dL (ref 0.3–1.2)
BUN: 10 mg/dL (ref 6–20)
CHLORIDE: 102 mmol/L (ref 101–111)
CO2: 25 mmol/L (ref 22–32)
Calcium: 9.3 mg/dL (ref 8.9–10.3)
Creatinine, Ser: 0.94 mg/dL (ref 0.61–1.24)
GFR calc Af Amer: >60 mL/min (ref >60)
GFR calc non Af Amer: >60 mL/min (ref >60)
GLUCOSE: 157 mg/dL — AB (ref 65–99)
POTASSIUM: 4.1 mmol/L (ref 3.5–5.1)
Sodium: 137 mmol/L (ref 135–145)
Total Protein: 8.6 g/dL — ABNORMAL HIGH (ref 6.5–8.1)

## 2017-07-05 LAB — URINALYSIS, ROUTINE W REFLEX MICROSCOPIC
BILIRUBIN URINE: NEGATIVE
Glucose, UA: NEGATIVE mg/dL
Ketones, ur: 5 mg/dL — AB
Nitrite: NEGATIVE
Protein, ur: NEGATIVE mg/dL
SPECIFIC GRAVITY, URINE: 1.013 (ref 1.005–1.030)
pH: 5 (ref 5.0–8.0)

## 2017-07-05 LAB — CBC
HEMATOCRIT: 43.9 % (ref 39.0–52.0)
Hemoglobin: 14.5 g/dL (ref 13.0–17.0)
MCH: 28.9 pg (ref 26.0–34.0)
MCHC: 33 g/dL (ref 30.0–36.0)
MCV: 87.5 fL (ref 78.0–100.0)
PLATELETS: 355 10*3/uL (ref 150–400)
RBC: 5.02 MIL/uL (ref 4.22–5.81)
RDW: 13.9 % (ref 11.5–15.5)
WBC: 13.1 10*3/uL — AB (ref 4.0–10.5)

## 2017-07-05 LAB — ETHANOL: Alcohol, Ethyl (B): <10 mg/dL (ref <10)

## 2017-07-05 LAB — LIPASE, BLOOD: Lipase: 796 U/L — ABNORMAL HIGH (ref 11–51)

## 2017-07-05 LAB — MAGNESIUM: Magnesium: 1.8 mg/dL (ref 1.7–2.4)

## 2017-07-05 MED ORDER — SODIUM CHLORIDE 0.9 % IV SOLN
INTRAVENOUS | Status: DC
  Administered 2017-07-05 – 2017-07-06 (×2): via INTRAVENOUS

## 2017-07-05 MED ORDER — MORPHINE SULFATE (PF) 4 MG/ML IV SOLN
2.0000 mg | INTRAVENOUS | Status: AC | PRN
  Administered 2017-07-05 – 2017-07-06 (×2): 2 mg via INTRAVENOUS
  Filled 2017-07-05 (×2): qty 1

## 2017-07-05 MED ORDER — ONDANSETRON HCL 4 MG/2ML IJ SOLN
4.0000 mg | Freq: Once | INTRAMUSCULAR | Status: AC
  Administered 2017-07-05: 4 mg via INTRAVENOUS
  Filled 2017-07-05: qty 2

## 2017-07-05 MED ORDER — FOLIC ACID 1 MG PO TABS: 1.0000 mg | ORAL_TABLET | Freq: Every day | ORAL | Status: DC

## 2017-07-05 MED ORDER — FOLIC ACID 1 MG PO TABS
1.0000 mg | ORAL_TABLET | Freq: Every day | ORAL | Status: DC
  Administered 2017-07-06 – 2017-07-07 (×2): 1 mg via ORAL
  Filled 2017-07-05 (×2): qty 1

## 2017-07-05 MED ORDER — LORAZEPAM 1 MG PO TABS: 1.0000 mg | ORAL_TABLET | Freq: Four times a day (QID) | ORAL | Status: DC | PRN

## 2017-07-05 MED ORDER — IOPAMIDOL (ISOVUE-300) INJECTION 61%
100.0000 mL | Freq: Once | INTRAVENOUS | Status: AC | PRN
  Administered 2017-07-05: 100 mL via INTRAVENOUS

## 2017-07-05 MED ORDER — THIAMINE HCL 100 MG/ML IJ SOLN: 100.0000 mg | Freq: Every day | INTRAMUSCULAR | Status: DC

## 2017-07-05 MED ORDER — LORAZEPAM 1 MG PO TABS
0.0000 mg | ORAL_TABLET | Freq: Four times a day (QID) | ORAL | Status: DC
  Filled 2017-07-05: qty 2

## 2017-07-05 MED ORDER — LORAZEPAM 2 MG/ML IJ SOLN: 1.0000 mg | Freq: Four times a day (QID) | INTRAMUSCULAR | Status: DC | PRN

## 2017-07-05 MED ORDER — MORPHINE SULFATE (PF) 4 MG/ML IV SOLN
4.0000 mg | INTRAVENOUS | Status: DC | PRN
Start: 2017-07-05 — End: 2017-07-05
  Administered 2017-07-05: 4 mg via INTRAVENOUS
  Filled 2017-07-05: qty 1

## 2017-07-05 MED ORDER — ADULT MULTIVITAMIN W/MINERALS CH
1.0000 | ORAL_TABLET | Freq: Every day | ORAL | Status: DC
  Administered 2017-07-06 – 2017-07-07 (×2): 1 via ORAL
  Filled 2017-07-05 (×2): qty 1

## 2017-07-05 MED ORDER — LORAZEPAM 1 MG PO TABS
0.0000 mg | ORAL_TABLET | Freq: Two times a day (BID) | ORAL | Status: DC
Start: 2017-07-08 — End: 2017-07-07

## 2017-07-05 MED ORDER — SODIUM CHLORIDE 0.9 % IV BOLUS (SEPSIS)
500.0000 mL | Freq: Once | INTRAVENOUS | Status: AC
  Administered 2017-07-05: 500 mL via INTRAVENOUS

## 2017-07-05 MED ORDER — ONDANSETRON HCL 4 MG/2ML IJ SOLN: 4.0000 mg | Freq: Four times a day (QID) | INTRAMUSCULAR | Status: DC | PRN

## 2017-07-05 MED ORDER — HYDROMORPHONE HCL 1 MG/ML IJ SOLN
0.5000 mg | INTRAMUSCULAR | Status: DC | PRN
  Administered 2017-07-05: 0.5 mg via INTRAVENOUS
  Filled 2017-07-05: qty 1

## 2017-07-05 MED ORDER — VITAMIN B-1 100 MG PO TABS
100.0000 mg | ORAL_TABLET | Freq: Every day | ORAL | Status: DC
  Administered 2017-07-06 – 2017-07-07 (×2): 100 mg via ORAL
  Filled 2017-07-05 (×2): qty 1

## 2017-07-05 MED ORDER — SODIUM CHLORIDE 0.9 % IV SOLN
Freq: Once | INTRAVENOUS | Status: AC
  Administered 2017-07-05: 18:00:00 via INTRAVENOUS

## 2017-07-05 MED ORDER — ONDANSETRON HCL 4 MG PO TABS: 4.0000 mg | ORAL_TABLET | Freq: Four times a day (QID) | ORAL | Status: DC | PRN

## 2017-07-05 MED ORDER — ENOXAPARIN SODIUM 40 MG/0.4ML ~~LOC~~ SOLN
40.0000 mg | Freq: Every day | SUBCUTANEOUS | Status: DC
  Filled 2017-07-05: qty 0.4

## 2017-07-05 MED ORDER — VITAMIN B-1 100 MG PO TABS: 100.0000 mg | ORAL_TABLET | Freq: Every day | ORAL | Status: DC

## 2017-07-05 MED ORDER — PROMETHAZINE HCL 25 MG/ML IJ SOLN
12.5000 mg | Freq: Once | INTRAMUSCULAR | Status: AC
  Administered 2017-07-05: 12.5 mg via INTRAVENOUS
  Filled 2017-07-05: qty 1

## 2017-07-05 NOTE — ED Notes (Signed)
Patient transported to CT 

## 2017-07-05 NOTE — H&P (Signed)
History and Physical    Antonio LarocheRaphael P Sinagra JXB:[id number] DOB: 02/01/1981 DOA: 07/05/2017  PCP: Marcine MatarJohnson, Deborah B, MD   Patient coming from: Home  Chief Complaint:   HPI: Antonio Alvarez is a 36 y.o. male with medical history significant of multiple substance- abuse- alcohol, tobacco, Marijuana,cocaine, schizophrenia, homelessness, scarring alopecia. Patient presented with complaints of upper abdominal pain, radiating to back that started at about 10 AM this morning. Patient reports 7-8 episodes of vomiting today, initially nonbloody per later wiith little amount of blood. Normal bowel habits-last bowel movement yesterday. Patient denies dysuria. Patient reports last drink of alcohol- busch ice-sided was at about 10 PM yesterday evening. He normally drinks 6 12 ounces of beer daily. Patient also ate 2 hot dogs yesterday afternoon and thinks this provoked his pancreatitis.   ED Course: mildly elevated blood pressure but otherwise stable vitals, lipase elevated at 796, UA with few bacteria and large leukocytes, WBC elevated at 13.1. CT abdomen and pelvis with contrast showed acute uncomplicated pancreatitis with peripancreatic edema, chronic thick-walled appearance of the urinary bladder. Patient was given 500 ml bolus in ED, morphine and dilaudid, Zofran and Phenergan.  Review of Systems: As per HPI otherwise 10 point review of systems negative.   Past Medical History:  Diagnosis Date  . Abscess    hx of abscess on neck  . Alcohol abuse   . Anxiety   . Bipolar disorder (HCC)   . Cellulitis   . Chronic back pain   . Depression   . Hyponatremia   . Insomnia   . Leukocytosis   . Pancreatitis     Past Surgical History:  Procedure Laterality Date  . INCISION AND DRAINAGE ABSCESS  X 3   "back, both sides of my neck"    reports that he has been smoking Cigarettes.  He has a 10.50 pack-year smoking history. He has never used smokeless tobacco. He reports that he drinks about 29.4 oz of alcohol  per week . He reports that he does not use drugs.  No Known Allergies  Family History  Problem Relation Age of Onset  . Alcohol abuse Father    Prior to Admission medications   Not on File    Physical Exam: Vitals:   07/05/17 1700 07/05/17 1800 07/05/17 1805 07/05/17 2028  BP: (!) 145/97 (!) 135/102 (!) 135/102 (!) 140/95  Pulse: 62 (!) 59 63 62  Resp:   14 15  Temp:    98.9 F (37.2 C)  TempSrc:    Oral  SpO2: 99% 98% 97% 96%  Weight:    65.8 kg (145 lb)  Height:    5\' 11"  (1.803 m)    Constitutional: NAD, calm, comfortable, Significant nodular scarring on scalp, with areas of hair loss Vitals:   07/05/17 1700 07/05/17 1800 07/05/17 1805 07/05/17 2028  BP: (!) 145/97 (!) 135/102 (!) 135/102 (!) 140/95  Pulse: 62 (!) 59 63 62  Resp:   14 15  Temp:    98.9 F (37.2 C)  TempSrc:    Oral  SpO2: 99% 98% 97% 96%  Weight:    65.8 kg (145 lb)  Height:    5\' 11"  (1.803 m)   Eyes: PERRL, lids and conjunctivae normal ENMT: Mucous membranes are moist. Posterior pharynx clear of any exudate or lesions.Normal dentition.  Neck: normal, supple, no masses, no thyromegaly Respiratory: clear to auscultation bilaterally, no wheezing, no crackles. Normal respiratory effort. No accessory muscle use.  Cardiovascular: Regular rate and rhythm, no  murmurs / rubs / gallops. No extremity edema. 2+ pedal pulses.  Abdomen:  Mild epigastric tenderness, no masses palpated. No hepatosplenomegaly. Bowel sounds positive.  Musculoskeletal: no clubbing / cyanosis. No joint deformity upper and lower extremities. Good ROM, no contractures. Normal muscle tone.  Skin: no rashes, lesions, ulcers. No induration Neurologic: CN 2-12 grossly intact. Moving all extremities spontaneously Psychiatric: Normal judgment and insight. Alert and oriented x 3. Normal mood.   Labs on Admission: I have personally reviewed following labs and imaging studies  CBC:  Recent Labs Lab 07/05/17 1325  WBC 13.1*  HGB 14.5    HCT 43.9  MCV 87.5  PLT 355   Basic Metabolic Panel:  Recent Labs Lab 07/05/17 1325  NA 137  K 4.1  CL 102  CO2 25  GLUCOSE 157*  BUN 10  CREATININE 0.94  CALCIUM 9.3   Liver Function Tests:  Recent Labs Lab 07/05/17 1325  AST 34  ALT 31  ALKPHOS 79  BILITOT 0.6  PROT 8.6*  ALBUMIN 4.5    Recent Labs Lab 07/05/17 1325  LIPASE 796*   Urine analysis:    Component Value Date/Time   COLORURINE YELLOW 07/05/2017 1250   APPEARANCEUR CLOUDY (A) 07/05/2017 1250   LABSPEC 1.013 07/05/2017 1250   PHURINE 5.0 07/05/2017 1250   GLUCOSEU NEGATIVE 07/05/2017 1250   HGBUR SMALL (A) 07/05/2017 1250   BILIRUBINUR NEGATIVE 07/05/2017 1250   KETONESUR 5 (A) 07/05/2017 1250   PROTEINUR NEGATIVE 07/05/2017 1250   UROBILINOGEN 0.2 04/27/2015 0734   NITRITE NEGATIVE 07/05/2017 1250   LEUKOCYTESUR LARGE (A) 07/05/2017 1250    Radiological Exams on Admission: Ct Abdomen Pelvis W Contrast  Result Date: 07/05/2017 CLINICAL DATA:  Back pain and upper abdominal pain with emesis since this morning. History of pancreatitis. EXAM: CT ABDOMEN AND PELVIS WITH CONTRAST TECHNIQUE: Multidetector CT imaging of the abdomen and pelvis was performed using the standard protocol following bolus administration of intravenous contrast. CONTRAST:  ISOVUE-300 IOPAMIDOL (ISOVUE-300) INJECTION 61% COMPARISON:  03/28/2016 FINDINGS: Lower chest: Normal size heart without pericardial effusion. Clear lung bases. Hepatobiliary: Hepatic steatosis. Nondistended gallbladder without stones. No biliary dilatation. Pancreas: Peripancreatic edema and fluid consistent with acute pancreatitis. No necrosis or ductal dilatation. No mass. Patent portal and splenic veins. Spleen: No splenomegaly. Adrenals/Urinary Tract: Normal bilateral adrenal glands. Left lower pole renal cortical scarring with stable approximately 17 mm cystic structure in the lower pole, unchanged. Urinary bladder is mildly thick-walled in  appearance circumferentially. Chronic cystitis might account for this appearance. Stomach/Bowel: Nondistended stomach. Normal small bowel rotation without obstruction or dilatation. Moderate fecal residue within the colon. Normal appearing appendix. Vascular/Lymphatic: Normal sized aorta.  No lymphadenopathy. Reproductive: Generous size prostate and seminal vesicles. Other: Small amount of upper abdominal ascites. Musculoskeletal: No acute or significant osseous findings. IMPRESSION: 1. Dominant finding of acute uncomplicated pancreatitis with peripancreatic edema and adjacent small volume ascites. 2. Chronic thick-walled appearance the urinary bladder question chronic cystitis or stigmata of chronic bladder outlet obstruction from mildly enlarged prostate. 3. Chronic stable 17 mm complex cyst in the left lower pole with associated cortical thinning. 4. Hepatic steatosis. Electronically Signed   By: Tollie Eth M.D.   On: 07/05/2017 19:28   EKG: None  Assessment/Plan Principal Problem:   Acute pancreatitis Active Problems:   Alcohol abuse   Paranoid schizophrenia (HCC)   Tobacco abuse   Homelessness   Polysubstance abuse (HCC)   Scarring alopecia  Acute pancreatitis-lipase elevated at 796, CT abdomen findings of acute  uncomplicated pancreatitis with peripancreatic edema. WBC elevated at 13.1. Stable kidney functionLikely due to alcohol abuse. - Nothing by mouth - IV fluids normal saline 100 mL/h -pain control with morphine 2 mg every 3 hourly -Zofran - lipid panel, rule out hypertriglyceridemia. - CBC a.m.  Polysubstance abuse- alcohol, tobacco, cannabis. Patient denies cocaine abuse but listed in problem list. Tobacco- 1/2 to 1 pack per day. - UDS  Alcohol abuse- patient drinks 6 12 ounce beers daily- busch ice. Last drink last night stating p.m. - blood Alcohol level - thiamine folate - CIWA - check magnesium  Homelessness- homeless for about 12 years, family lives in [place]  mother and sister. - Social work consult.  Paranoid Schizophrenia, scarring alopecia- stable, not on medications.  Chronic bladder wall thickening- no urinary symptoms. UA with few bacteria and large leukocytes. - follow-up urine cultures - will hold off on antibiotics at this time pending clinical course and urine cultures, symptoms most likely due to acute pancreatitis. -Might need to follow-up as outpatient   DVT prophylaxis: Lovenox Code Status: Full Family Communication: None at bedside Disposition Plan: To be determined Consults called: social work Admission status: inpatient, tele    Onnie Boer MD [facility] Pager [phone]  If 7PM-7AM, please contact night-coverage [email or web address] Password [email or web address]  07/05/2017, 9:27 PM

## 2017-07-05 NOTE — ED Notes (Signed)
Patient aware a urine culture is needed. Urinal at bedside.

## 2017-07-05 NOTE — ED Triage Notes (Addendum)
Pt reports back pain over entire spine, upper abd pain, and emesis since this am. No diarrhea. Hx of pancreatitis. Thinks his pain may have started due to something he ate yesterday.

## 2017-07-05 NOTE — ED Notes (Signed)
Bed: [facility]4 Expected date:  Expected time:  Means of arrival:  Comments: 

## 2017-07-05 NOTE — ED Provider Notes (Addendum)
[facility]-EMERGENCY DEPT Provider Note   CSN: [id number] Arrival date & time: 07/05/17  1234     History   Chief Complaint Chief Complaint  Patient presents with  . Abdominal Pain  . Back Pain  . Emesis    HPI Antonio Alvarez is a 36 y.o. male. Chief complaint abdominal pain, back pain.  HPI:  36 year old male. History of alcohol abuse and previous pancreatitis. Last alcohol was yesterday. He drinks 3-4 beers a day. States "I don't think that liquor anymore, gives me pancreatitis".  Vomiting today. Pain is epigastric and left upper quadrant and back. Denies any symptoms of which ought today, or in the past.  Per his past CT scans as chronic thickening of the bladder with distention is been present through several years. His chronic hematuria and urine WBCs. He is unaware of the chronic bladder issue.  His urine output is normal. No gross hematuria.  Past Medical History:  Diagnosis Date  . Abscess    hx of abscess on neck  . Alcohol abuse   . Anxiety   . Bipolar disorder (HCC)   . Cellulitis   . Chronic back pain   . Depression   . Hyponatremia   . Insomnia   . Leukocytosis   . Pancreatitis     Patient Active Problem List   Diagnosis Date Noted  . Homelessness 02/02/2017  . Polysubstance abuse (HCC) 02/02/2017  . Scarring alopecia 02/02/2017  . Palpitations 02/02/2017  . Alcoholic pancreatitis 02/13/2014  . Tobacco abuse 02/13/2014  . Cocaine dependence (HCC) 09/09/2013  . Cannabis dependence (HCC) 09/09/2013  . Paranoid schizophrenia (HCC) 09/09/2013  . Substance induced mood disorder (HCC) 09/08/2013  . Alcohol abuse 08/14/2011    Past Surgical History:  Procedure Laterality Date  . INCISION AND DRAINAGE ABSCESS  X 3   "back, both sides of my neck"       Home Medications    Prior to Admission medications   Not on File    Family History Family History  Problem Relation Age of Onset  . Alcohol abuse Father      Social History Social History  Substance Use Topics  . Smoking status: Current Every Day Smoker    Packs/day: 0.50    Years: 21.00    Types: Cigarettes  . Smokeless tobacco: Never Used  . Alcohol use 29.4 oz/week    49 Cans of beer per week     Comment: 03/28/2016 "average 7 beers/day"     Allergies   Patient has no known allergies.   Review of Systems Review of Systems  Constitutional: Negative for appetite change, chills, diaphoresis, fatigue and fever.  HENT: Negative for mouth sores, sore throat and trouble swallowing.   Eyes: Negative for visual disturbance.  Respiratory: Negative for cough, chest tightness, shortness of breath and wheezing.   Cardiovascular: Negative for chest pain.  Gastrointestinal: Positive for abdominal pain, diarrhea and nausea. Negative for abdominal distention and vomiting.  Endocrine: Negative for polydipsia, polyphagia and polyuria.  Genitourinary: Negative for dysuria, frequency and hematuria.  Musculoskeletal: Negative for gait problem.  Skin: Negative for color change, pallor and rash.  Neurological: Negative for dizziness, syncope, light-headedness and headaches.  Hematological: Does not bruise/bleed easily.  Psychiatric/Behavioral: Negative for behavioral problems and confusion.     Physical Exam Updated Vital Signs BP (!) 135/102 (BP Location: Left Arm)   Pulse 63   Temp 98 F (36.7 C) (Oral)   Resp 14   SpO2 97%  Physical Exam  Constitutional: He is oriented to person, place, and time. He appears well-developed and well-nourished. No distress.  HENT:  Head: Normocephalic.  Eyes: Pupils are equal, round, and reactive to light. Conjunctivae are normal. No scleral icterus.  Neck: Normal range of motion. Neck supple. No thyromegaly present.  Cardiovascular: Normal rate and regular rhythm.  Exam reveals no gallop and no friction rub.   No murmur heard. Pulmonary/Chest: Effort normal and breath sounds normal. No respiratory  distress. He has no wheezes. He has no rales.  Abdominal: Soft. Bowel sounds are normal. He exhibits no distension. There is no tenderness. There is no rebound.  Tenderness in the mid epigastrium and left upper quadrant.  No peritoneal irritation.  Musculoskeletal: Normal range of motion.  Neurological: He is alert and oriented to person, place, and time.  Skin: Skin is warm and dry. No rash noted.  Psychiatric: He has a normal mood and affect. His behavior is normal.     ED Treatments / Results  Labs (all labs ordered are listed, but only abnormal results are displayed) Labs Reviewed  LIPASE, BLOOD - Abnormal; Notable for the following:       Result Value   Lipase 796 (*)    All other components within normal limits  COMPREHENSIVE METABOLIC PANEL - Abnormal; Notable for the following:    Glucose, Bld 157 (*)    Total Protein 8.6 (*)    All other components within normal limits  CBC - Abnormal; Notable for the following:    WBC 13.1 (*)    All other components within normal limits  URINALYSIS, ROUTINE W REFLEX MICROSCOPIC - Abnormal; Notable for the following:    APPearance CLOUDY (*)    Hgb urine dipstick SMALL (*)    Ketones, ur 5 (*)    Leukocytes, UA LARGE (*)    Bacteria, UA FEW (*)    Squamous Epithelial / LPF 0-5 (*)    All other components within normal limits  URINE CULTURE    EKG  EKG Interpretation None       Radiology Ct Abdomen Pelvis W Contrast  Result Date: 07/05/2017 CLINICAL DATA:  Back pain and upper abdominal pain with emesis since this morning. History of pancreatitis. EXAM: CT ABDOMEN AND PELVIS WITH CONTRAST TECHNIQUE: Multidetector CT imaging of the abdomen and pelvis was performed using the standard protocol following bolus administration of intravenous contrast. CONTRAST:  100mL ISOVUE-300 IOPAMIDOL (ISOVUE-300) INJECTION 61% COMPARISON:  03/28/2016 FINDINGS: Lower chest: Normal size heart without pericardial effusion. Clear lung bases.  Hepatobiliary: Hepatic steatosis. Nondistended gallbladder without stones. No biliary dilatation. Pancreas: Peripancreatic edema and fluid consistent with acute pancreatitis. No necrosis or ductal dilatation. No mass. Patent portal and splenic veins. Spleen: No splenomegaly. Adrenals/Urinary Tract: Normal bilateral adrenal glands. Left lower pole renal cortical scarring with stable approximately 17 mm cystic structure in the lower pole, unchanged. Urinary bladder is mildly thick-walled in appearance circumferentially. Chronic cystitis might account for this appearance. Stomach/Bowel: Nondistended stomach. Normal small bowel rotation without obstruction or dilatation. Moderate fecal residue within the colon. Normal appearing appendix. Vascular/Lymphatic: Normal sized aorta.  No lymphadenopathy. Reproductive: Generous size prostate and seminal vesicles. Other: Small amount of upper abdominal ascites. Musculoskeletal: No acute or significant osseous findings. IMPRESSION: 1. Dominant finding of acute uncomplicated pancreatitis with peripancreatic edema and adjacent small volume ascites. 2. Chronic thick-walled appearance the urinary bladder question chronic cystitis or stigmata of chronic bladder outlet obstruction from mildly enlarged prostate. 3. 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 4. Hepatic steatosis. Electronically Signed   By: Tollie Eth M.D.   On: 07/05/2017 19:28    Procedures Procedures (including critical care time)  Medications Ordered in ED Medications  morphine 4 MG/ML injection 4 mg (4 mg Intravenous Given 07/05/17 1712)  HYDROmorphone (DILAUDID) injection 0.5 mg (0.5 mg Intravenous Given 07/05/17 1856)  sodium chloride 0.9 % bolus 500 mL (0 mLs Intravenous Stopped 07/05/17 1750)  0.9 %  sodium chloride infusion ( Intravenous New Bag/Given 07/05/17 1800)  ondansetron (ZOFRAN) injection 4 mg (4 mg Intravenous Given 07/05/17 1712)    iopamidol (ISOVUE-300) 61 % injection 100 mL (100 mLs Intravenous Contrast Given 07/05/17 1821)  promethazine (PHENERGAN) injection 12.5 mg (12.5 mg Intravenous Given 07/05/17 1856)     Initial Impression / Assessment and Plan / ED Course  I have reviewed the triage vital signs and the nursing notes.  Pertinent labs & imaging results that were available during my care of the patient were reviewed by me and considered in my medical decision making (see chart for details).    Elevated lipase. I have a clinical exam consistent with pancreatitis. His renal function remains normal. We'll image for changes from his pancreatitis. IV fluids. Pain control. Emetic control. Reevaluation.  CT scan shows edema around the pancreas and minimal small-volume ascites. No other signs of complication. He has enlarged urinary bladder with thickened wall. This is been present since 2014 on CT scans. He has leukocytes and RBCs in his urine. He is not symptomatic and is voiding without difficulty. CT scan describes slightly large prostate and is likely chronic. He was remedicated for some recurrent pain and nausea after CT states he is more comfortable now.  I will discuss admission with hospitalist  Final Clinical Impressions(s) / ED Diagnoses   Final diagnoses:  Alcohol-induced acute pancreatitis, unspecified complication status  Urinary retention    New Prescriptions New Prescriptions   No medications on file     Rolland Porter, MD 07/05/17 1954    Rolland Porter, MD 07/05/17 2023

## 2017-07-06 DIAGNOSIS — F200 Paranoid schizophrenia: Secondary | ICD-10-CM

## 2017-07-06 DIAGNOSIS — F1910 Other psychoactive substance abuse, uncomplicated: Secondary | ICD-10-CM

## 2017-07-06 DIAGNOSIS — F1010 Alcohol abuse, uncomplicated: Secondary | ICD-10-CM

## 2017-07-06 DIAGNOSIS — R339 Retention of urine, unspecified: Secondary | ICD-10-CM

## 2017-07-06 DIAGNOSIS — K861 Other chronic pancreatitis: Secondary | ICD-10-CM

## 2017-07-06 DIAGNOSIS — Z590 Homelessness: Secondary | ICD-10-CM

## 2017-07-06 DIAGNOSIS — N390 Urinary tract infection, site not specified: Secondary | ICD-10-CM

## 2017-07-06 LAB — LIPID PANEL
Cholesterol: 143 mg/dL (ref 0–200)
HDL: 67 mg/dL (ref >40)
LDL CALC: 65 mg/dL (ref 0–99)
Total CHOL/HDL Ratio: 2.1 RATIO
Triglycerides: 53 mg/dL (ref <150)
VLDL: 11 mg/dL (ref 0–40)

## 2017-07-06 LAB — CBC
HEMATOCRIT: 43 % (ref 39.0–52.0)
Hemoglobin: 14.3 g/dL (ref 13.0–17.0)
MCH: 29.1 pg (ref 26.0–34.0)
MCHC: 33.3 g/dL (ref 30.0–36.0)
MCV: 87.6 fL (ref 78.0–100.0)
Platelets: 363 10*3/uL (ref 150–400)
RBC: 4.91 MIL/uL (ref 4.22–5.81)
RDW: 14.2 % (ref 11.5–15.5)
WBC: 15.7 10*3/uL — ABNORMAL HIGH (ref 4.0–10.5)

## 2017-07-06 MED ORDER — SENNA 8.6 MG PO TABS
1.0000 | ORAL_TABLET | Freq: Two times a day (BID) | ORAL | Status: DC
  Administered 2017-07-06 – 2017-07-07 (×3): 8.6 mg via ORAL
  Filled 2017-07-06 (×3): qty 1

## 2017-07-06 MED ORDER — POLYETHYLENE GLYCOL 3350 17 G PO PACK
17.0000 g | PACK | Freq: Every day | ORAL | Status: DC
  Administered 2017-07-06 – 2017-07-07 (×2): 17 g via ORAL
  Filled 2017-07-06 (×3): qty 1

## 2017-07-06 MED ORDER — TAMSULOSIN HCL 0.4 MG PO CAPS
0.4000 mg | ORAL_CAPSULE | Freq: Every day | ORAL | Status: DC
  Administered 2017-07-06 – 2017-07-07 (×2): 0.4 mg via ORAL
  Filled 2017-07-06 (×2): qty 1

## 2017-07-06 MED ORDER — OXYCODONE-ACETAMINOPHEN 5-325 MG PO TABS
1.0000 | ORAL_TABLET | ORAL | Status: DC | PRN
  Administered 2017-07-06 – 2017-07-07 (×6): 1 via ORAL
  Filled 2017-07-06 (×6): qty 1

## 2017-07-06 MED ORDER — DEXTROSE 5 % IV SOLN
1.0000 g | INTRAVENOUS | Status: DC
  Administered 2017-07-06: 1 g via INTRAVENOUS
  Filled 2017-07-06: qty 10

## 2017-07-06 MED ORDER — INFLUENZA VAC SPLIT QUAD 0.5 ML IM SUSY
0.5000 mL | PREFILLED_SYRINGE | INTRAMUSCULAR | Status: DC
  Filled 2017-07-06: qty 0.5

## 2017-07-06 NOTE — Progress Notes (Addendum)
PROGRESS NOTE  Antonio Alvarez:[id number] DOB: 04/01/1981 DOA: 07/05/2017 PCP: Marcine Matar, MD  HPI/Recap of past 24 hours:  Seems comfortable , no vomiting, no fever Report diffuse ab pain, report no bm for several days  Assessment/Plan: Principal Problem:   Acute pancreatitis Active Problems:   Alcohol abuse   Paranoid schizophrenia (HCC)   Tobacco abuse   Homelessness   Polysubstance abuse (HCC)   Scarring alopecia  Recurrent alcohol pancreatitis -patient presented with ab pain, back pain, n/v -CT ab/ple on presentation" Dominant finding of acute uncomplicated pancreatitis with peripancreatic edema and adjacent small volume ascites." -Lipase elevated at 796 with normal lft  on presentation -he is kept npo, started on ivf since admission -improving, no more vomiting, less pain, start clears, repeat lipase  Diffuse abdominal pain:  He does has epigastric pain/tenderness, likely from gastritis /pancreatitis He also has diffuse ab tender and suprapubic tenderness He report being constipated , there is urine retention on ct scan He is on ppi for gastritis Start stool regimen for constipation.   Acute urinary retention vs acute on chronic -review prior ct scans in the system, he has urinary retention in the past ct scans -ua concerning for uti with rbc TNTC, wbc TNTC, urine culture pending -CT ab" Chronic thick-walled appearance the urinary bladder question chronic cystitis or stigmata of chronic bladder outlet obstruction from mildly enlarged prostate." -check psa -He does has leukocytosis, low ab pain, will start rocephin -Insert foley, start flomax -Need outpatient urology follow up  Fatty liver - discussed with patient and advised to completely stop drinking to prevent cirrhosis -Hepatitis panel negative in the past -lft wnl this hospitalization  Alcohol use On alcohol withdrawal protocol No confusion or agitation currently Continue thiamine/folate  supplement  H/o schizopherenia? Vs substance induced? Not on meds at home. Currently calm/cooperation, no paranoia, no hallucination.  Homeless : Child psychotherapist consulted  Code Status: full  Family Communication: patient   Disposition Plan: pending   Consultants:  Child psychotherapist  Procedures:  Foley insertion  Antibiotics:  Rocephin from 10/22   Objective: BP (!) 154/86 (BP Location: Left Arm)   Pulse (!) 56   Temp 98.1 F (36.7 C) (Oral)   Resp 16   Ht 5\' 11"  (1.803 m)   Wt 65.8 kg (145 lb)   SpO2 99%   BMI 20.22 kg/m   Intake/Output Summary (Last 24 hours) at 07/06/17 0835 Last data filed at 07/06/17 0813  Gross per 24 hour  Intake          1196.67 ml  Output                0 ml  Net          1196.67 ml   Filed Weights   07/05/17 2028  Weight: 65.8 kg (145 lb)    Exam: Patient is examined daily including today on 07/06/2017, exams remain the same as of yesterday except that has changed    General:  NAD  Cardiovascular: RRR  Respiratory: CTABL  Abdomen: diffuse tender, guarding when epigastric region palpated, no rigidity, positive BS, + suprapubic tenderness  Musculoskeletal: No Edema  Neuro: alert, oriented   Data Reviewed: Basic Metabolic Panel:  Recent Labs Lab 07/05/17 1325 07/05/17 2138  NA 137  --   K 4.1  --   CL 102  --   CO2 25  --   GLUCOSE 157*  --   BUN 10  --   CREATININE 0.94  --  CALCIUM 9.3  --   MG  --  1.8   Liver Function Tests:  Recent Labs Lab 07/05/17 1325  AST 34  ALT 31  ALKPHOS 79  BILITOT 0.6  PROT 8.6*  ALBUMIN 4.5    Recent Labs Lab 07/05/17 1325  LIPASE 796*   No results for input(s): AMMONIA in the last 168 hours. CBC:  Recent Labs Lab 07/05/17 1325 07/06/17 0600  WBC 13.1* 15.7*  HGB 14.5 14.3  HCT 43.9 43.0  MCV 87.5 87.6  PLT 355 363   Cardiac Enzymes:   No results for input(s): CKTOTAL, CKMB, CKMBINDEX, TROPONINI in the last 168 hours. BNP (last 3 results) No results  for input(s): BNP in the last 8760 hours.  ProBNP (last 3 results) No results for input(s): PROBNP in the last 8760 hours.  CBG: No results for input(s): GLUCAP in the last 168 hours.  No results found for this or any previous visit (from the past 240 hour(s)).   Studies: Ct Abdomen Pelvis W Contrast  Result Date: 07/05/2017 CLINICAL DATA:  Back pain and upper abdominal pain with emesis since this morning. History of pancreatitis. EXAM: CT ABDOMEN AND PELVIS WITH CONTRAST TECHNIQUE: Multidetector CT imaging of the abdomen and pelvis was performed using the standard protocol following bolus administration of intravenous contrast. CONTRAST:  100mL ISOVUE-300 IOPAMIDOL (ISOVUE-300) INJECTION 61% COMPARISON:  03/28/2016 FINDINGS: Lower chest: Normal size heart without pericardial effusion. Clear lung bases. Hepatobiliary: Hepatic steatosis. Nondistended gallbladder without stones. No biliary dilatation. Pancreas: Peripancreatic edema and fluid consistent with acute pancreatitis. No necrosis or ductal dilatation. No mass. Patent portal and splenic veins. Spleen: No splenomegaly. Adrenals/Urinary Tract: Normal bilateral adrenal glands. Left lower pole renal cortical scarring with stable approximately 17 mm cystic structure in the lower pole, unchanged. Urinary bladder is mildly thick-walled in appearance circumferentially. Chronic cystitis might account for this appearance. Stomach/Bowel: Nondistended stomach. Normal small bowel rotation without obstruction or dilatation. Moderate fecal residue within the colon. Normal appearing appendix. Vascular/Lymphatic: Normal sized aorta.  No lymphadenopathy. Reproductive: Generous size prostate and seminal vesicles. Other: Small amount of upper abdominal ascites. Musculoskeletal: No acute or significant osseous findings. IMPRESSION: 1. Dominant finding of acute uncomplicated pancreatitis with peripancreatic edema and adjacent small volume ascites. 2. Chronic  thick-walled appearance the urinary bladder question chronic cystitis or stigmata of chronic bladder outlet obstruction from mildly enlarged prostate. 3. Chronic stable 17 mm complex cyst in the left lower pole with associated cortical thinning. 4. Hepatic steatosis. Electronically Signed   By: Tollie Ethavid  Kwon M.D.   On: 07/05/2017 19:28    Scheduled Meds: . enoxaparin (LOVENOX) injection  40 mg Subcutaneous Daily  . folic acid  1 mg Oral Daily  . [START ON 07/07/2017] Influenza vac split quadrivalent PF  0.5 mL Intramuscular Tomorrow-1000  . LORazepam  0-4 mg Oral Q6H   Followed by  . [START ON 07/08/2017] LORazepam  0-4 mg Oral Q12H  . multivitamin with minerals  1 tablet Oral Daily  . thiamine  100 mg Oral Daily   Or  . thiamine  100 mg Intravenous Daily    Continuous Infusions: . sodium chloride 100 mL/hr at 07/05/17 2302     Time spent: 35mins I have personally reviewed and interpreted on  07/06/2017 daily labs, tele strips, imagings as discussed above under date review session and assessment and plans.  I reviewed all nursing notes,   vitals, pertinent old records/old imagings  I have discussed plan of care as described above with RN ,  patient  on 07/06/2017   Makylie Rivere MD, PhD  [facility] Pager [phone]. If 7PM-7AM, please contact night-coverage at [email or web address], password [facility] 07/06/2017, 8:35 AM  LOS: 1 day

## 2017-07-07 DIAGNOSIS — R339 Retention of urine, unspecified: Secondary | ICD-10-CM

## 2017-07-07 LAB — CBC WITH DIFFERENTIAL/PLATELET
Basophils Absolute: 0 10*3/uL (ref 0.0–0.1)
Basophils Relative: 0 %
EOS ABS: 0.3 10*3/uL (ref 0.0–0.7)
Eosinophils Relative: 3 %
HEMATOCRIT: 40.2 % (ref 39.0–52.0)
HEMOGLOBIN: 13.3 g/dL (ref 13.0–17.0)
LYMPHS ABS: 1.8 10*3/uL (ref 0.7–4.0)
LYMPHS PCT: 17 %
MCH: 29.2 pg (ref 26.0–34.0)
MCHC: 33.1 g/dL (ref 30.0–36.0)
MCV: 88.2 fL (ref 78.0–100.0)
MONOS PCT: 7 %
Monocytes Absolute: 0.7 10*3/uL (ref 0.1–1.0)
NEUTROS ABS: 7.5 10*3/uL (ref 1.7–7.7)
NEUTROS PCT: 73 %
Platelets: 321 10*3/uL (ref 150–400)
RBC: 4.56 MIL/uL (ref 4.22–5.81)
RDW: 13.9 % (ref 11.5–15.5)
WBC: 10.3 10*3/uL (ref 4.0–10.5)

## 2017-07-07 LAB — URINALYSIS, ROUTINE W REFLEX MICROSCOPIC
BILIRUBIN URINE: NEGATIVE
Glucose, UA: NEGATIVE mg/dL
KETONES UR: NEGATIVE mg/dL
Nitrite: NEGATIVE
PH: 7 (ref 5.0–8.0)
Protein, ur: 100 mg/dL — AB
SPECIFIC GRAVITY, URINE: 1.014 (ref 1.005–1.030)

## 2017-07-07 LAB — COMPREHENSIVE METABOLIC PANEL
ALK PHOS: 68 U/L (ref 38–126)
ALT: 19 U/L (ref 17–63)
ANION GAP: 10 (ref 5–15)
AST: 19 U/L (ref 15–41)
Albumin: 3.5 g/dL (ref 3.5–5.0)
BILIRUBIN TOTAL: 0.8 mg/dL (ref 0.3–1.2)
BUN: 6 mg/dL (ref 6–20)
CALCIUM: 8.4 mg/dL — AB (ref 8.9–10.3)
CO2: 26 mmol/L (ref 22–32)
Chloride: 102 mmol/L (ref 101–111)
Creatinine, Ser: 0.84 mg/dL (ref 0.61–1.24)
Glucose, Bld: 107 mg/dL — ABNORMAL HIGH (ref 65–99)
Potassium: 3.6 mmol/L (ref 3.5–5.1)
Sodium: 138 mmol/L (ref 135–145)
TOTAL PROTEIN: 7.1 g/dL (ref 6.5–8.1)

## 2017-07-07 LAB — URINE CULTURE: CULTURE: NO GROWTH

## 2017-07-07 LAB — MAGNESIUM: Magnesium: 1.8 mg/dL (ref 1.7–2.4)

## 2017-07-07 LAB — LIPASE, BLOOD: LIPASE: 157 U/L — AB (ref 11–51)

## 2017-07-07 LAB — PSA: PROSTATIC SPECIFIC ANTIGEN: 4.39 ng/mL — AB (ref 0.00–4.00)

## 2017-07-07 MED ORDER — ADULT MULTIVITAMIN W/MINERALS CH: 1.0000 | ORAL_TABLET | Freq: Every day | ORAL | 0 refills | Status: DC

## 2017-07-07 MED ORDER — CEPHALEXIN 500 MG PO CAPS
500.0000 mg | ORAL_CAPSULE | Freq: Two times a day (BID) | ORAL | 0 refills | Status: AC
Start: 2017-07-07 — End: 2017-07-10

## 2017-07-07 MED ORDER — SENNOSIDES-DOCUSATE SODIUM 8.6-50 MG PO TABS: 1.0000 | ORAL_TABLET | Freq: Every day | ORAL | 0 refills | Status: DC

## 2017-07-07 MED ORDER — FOLIC ACID 1 MG PO TABS: 1.0000 mg | ORAL_TABLET | Freq: Every day | ORAL | 0 refills | Status: DC

## 2017-07-07 MED ORDER — BISACODYL 10 MG RE SUPP
10.0000 mg | Freq: Every day | RECTAL | Status: DC
  Administered 2017-07-07: 10 mg via RECTAL
  Filled 2017-07-07 (×2): qty 1

## 2017-07-07 MED ORDER — PANTOPRAZOLE SODIUM 20 MG PO TBEC
20.0000 mg | DELAYED_RELEASE_TABLET | Freq: Every day | ORAL | Status: DC
  Administered 2017-07-07: 20 mg via ORAL
  Filled 2017-07-07: qty 1

## 2017-07-07 MED ORDER — THIAMINE HCL 100 MG PO TABS: 100.0000 mg | ORAL_TABLET | Freq: Every day | ORAL | 0 refills | Status: DC

## 2017-07-07 MED ORDER — CEPHALEXIN 500 MG PO CAPS: 500.0000 mg | ORAL_CAPSULE | Freq: Two times a day (BID) | ORAL | 0 refills | Status: DC

## 2017-07-07 MED ORDER — POTASSIUM CHLORIDE CRYS ER 20 MEQ PO TBCR
40.0000 meq | EXTENDED_RELEASE_TABLET | Freq: Once | ORAL | Status: AC
  Administered 2017-07-07: 40 meq via ORAL
  Filled 2017-07-07: qty 2

## 2017-07-07 MED ORDER — TAMSULOSIN HCL 0.4 MG PO CAPS: 0.4000 mg | ORAL_CAPSULE | Freq: Every day | ORAL | 0 refills | Status: DC

## 2017-07-07 MED ORDER — POLYETHYLENE GLYCOL 3350 17 G PO PACK
17.0000 g | PACK | Freq: Once | ORAL | Status: AC
  Administered 2017-07-07: 17 g via ORAL

## 2017-07-07 MED ORDER — CEPHALEXIN 500 MG PO CAPS
500.0000 mg | ORAL_CAPSULE | Freq: Two times a day (BID) | ORAL | Status: DC
  Administered 2017-07-07: 500 mg via ORAL
  Filled 2017-07-07: qty 1

## 2017-07-07 MED ORDER — PANTOPRAZOLE SODIUM 20 MG PO TBEC: 20.0000 mg | DELAYED_RELEASE_TABLET | Freq: Every day | ORAL | 0 refills | Status: DC

## 2017-07-07 NOTE — Discharge Summary (Signed)
Discharge Summary  Antonio LarocheRaphael P Colantonio ZOX:[id number] DOB: 08/31/1981  PCP: Marcine MatarJohnson, Deborah B, MD  Admit date: 07/05/2017 Discharge date: 07/07/2017  Time spent: <6630mins  Recommendations for Outpatient Follow-up:  1. F/u with PMD within a week  for hospital discharge follow up, repeat cbc/bmp at follow up 2. F/u with urology for urinary retention, hematuria  Discharge Diagnoses:  Active Hospital Problems   Diagnosis Date Noted  . Acute pancreatitis 07/05/2017  . Polysubstance abuse (HCC) 02/02/2017  . Homelessness 02/02/2017  . Scarring alopecia 02/02/2017  . Tobacco abuse 02/13/2014  . Paranoid schizophrenia (HCC) 09/09/2013  . Alcohol abuse 08/14/2011    Resolved Hospital Problems   Diagnosis Date Noted Date Resolved  No resolved problems to display.    Discharge Condition: stable  Diet recommendation: regular diet  Filed Weights   07/05/17 2028  Weight: 65.8 kg (145 lb)    History of present illness:  PCP: Marcine MatarJohnson, Deborah B, MD   Patient coming from: Home  Chief Complaint:   HPI: Antonio LarocheRaphael P Alvarez is a 36 y.o. male with medical history significant of multiple substance- abuse- alcohol, tobacco, Marijuana,cocaine, schizophrenia, homelessness, scarring alopecia. Patient presented with complaints of upper abdominal pain, radiating to back that started at about 10 AM this morning. Patient reports 7-8 episodes of vomiting today, initially nonbloody per later wiith little amount of blood. Normal bowel habits-last bowel movement yesterday. Patient denies dysuria. Patient reports last drink of alcohol- busch ice-sided was at about 10 PM yesterday evening. He normally drinks 6 12 ounces of beer daily. Patient also ate 2 hot dogs yesterday afternoon and thinks this provoked his pancreatitis.   ED Course: mildly elevated blood pressure but otherwise stable vitals, lipase elevated at 796, UA with few bacteria and large leukocytes, WBC elevated at 13.1. CT abdomen and pelvis with  contrast showed acute uncomplicated pancreatitis with peripancreatic edema, chronic thick-walled appearance of the urinary bladder. Patient was given 500 ml bolus in ED, morphine and dilaudid, Zofran and Phenergan.   Hospital Course:  Principal Problem:   Acute pancreatitis Active Problems:   Alcohol abuse   Paranoid schizophrenia (HCC)   Tobacco abuse   Homelessness   Polysubstance abuse (HCC)   Scarring alopecia   Recurrent alcohol pancreatitis -patient presented with ab pain, back pain, n/v -CT ab/ple on presentation" Dominant finding of acute uncomplicated pancreatitis with peripancreatic edema and adjacent small volume ascites." -Lipase elevated at 796 with normal lft  on presentation -he was npo, on ivf initially, lipase trending down,  -he is tolerating diet advancement.   Diffuse abdominal pain:  -He does has epigastric pain/tenderness, likely from gastritis /pancreatitis -He also has diffuse ab tender and suprapubic tenderness -He report being constipated , there is urine retention on ct scan -He is on ppi for gastritis -Start stool regimen for constipation.   Acute urinary retention vs acute on chronic -review prior ct scans in the system, he has urinary retention in the past ct scans -ua concerning for uti with rbc TNTC, wbc TNTC, urine culture pending -CT ab" Chronic thick-walled appearance the urinary bladder question chronic cystitis or stigmata of chronic bladder outlet obstruction from mildly enlarged prostate." -psa 4.39 -He does has leukocytosis, low ab pain, he is treated with rocephin -urine culture no growth, leukocytosis resolved, foley removed, he is discharged on flomax/keflex  -Need outpatient urology follow up  Fatty liver - discussed with patient and advised to completely stop drinking to prevent cirrhosis -Hepatitis panel negative in the past -lft wnl this hospitalization  Alcohol use On alcohol withdrawal protocol No confusion or  agitation currently Continue thiamine/folate supplement  H/o schizopherenia? Vs substance induced? Not on meds at home. Currently calm/cooperation, no paranoia, no hallucination.  Homeless : Child psychotherapist consulted  Code Status: full  Family Communication: patient , patient declined my offer to contact his family.  Disposition Plan: discharge from the hospital, social worker to assist on discharge.   Consultants:  Child psychotherapist  Procedures:  Foley insertion and removal  Antibiotics:  Rocephin from 10/22    Discharge Exam: BP 129/73 (BP Location: Left Arm)   Pulse (!) 58   Temp 98 F (36.7 C) (Oral)   Resp 20   Ht 5\' 11"  (1.803 m)   Wt 65.8 kg (145 lb)   SpO2 99%   BMI 20.22 kg/m   General: NAD Cardiovascular: rrr Respiratory: CTABL Ab: less tender, soft, +bs  Discharge Instructions You were cared for by a hospitalist during your hospital stay. If you have any questions about your discharge medications or the care you received while you were in the hospital after you are discharged, you can call the unit and asked to speak with the hospitalist on call if the hospitalist that took care of you is not available. Once you are discharged, your primary care physician will handle any further medical issues. Please note that NO REFILLS for any discharge medications will be authorized once you are discharged, as it is imperative that you return to your primary care physician (or establish a relationship with a primary care physician if you do not have one) for your aftercare needs so that they can reassess your need for medications and monitor your lab values.  Discharge Instructions    Diet general    Complete by:  As directed    Increase activity slowly    Complete by:  As directed      Allergies as of 07/07/2017   No Known Allergies     Medication List    TAKE these medications   cephALEXin 500 MG capsule Commonly known as:  KEFLEX Take 1 capsule (500  mg total) by mouth every 12 (twelve) hours.   folic acid 1 MG tablet Commonly known as:  FOLVITE Take 1 tablet (1 mg total) by mouth daily.   multivitamin with minerals Tabs tablet Take 1 tablet by mouth daily.   pantoprazole 20 MG tablet Commonly known as:  PROTONIX Take 1 tablet (20 mg total) by mouth daily.   senna-docusate 8.6-50 MG tablet Commonly known as:  Senokot-S Take 1 tablet by mouth at bedtime.   tamsulosin 0.4 MG Caps capsule Commonly known as:  FLOMAX Take 1 capsule (0.4 mg total) by mouth daily.   thiamine 100 MG tablet Take 1 tablet (100 mg total) by mouth daily.      No Known Allergies Follow-up Information    [facility] Follow up in 2 week(s).   Why:  urinary retention, hematuria Contact information: [address] [place] [address] [phone]        Marcine Matar, MD Follow up in 1 week(s).   Specialty:  Internal Medicine Why:  hospital discharge follow up Contact information: [address] [place] [address] [phone]            The results of significant diagnostics from this hospitalization (including imaging, microbiology, ancillary and laboratory) are listed below for reference.    Significant Diagnostic Studies: Ct Abdomen Pelvis W Contrast  Result  Date: 07/05/2017 CLINICAL DATA:  Back pain and upper abdominal pain with emesis since this morning. History of pancreatitis. EXAM: CT ABDOMEN AND PELVIS WITH CONTRAST TECHNIQUE: Multidetector CT imaging of the abdomen and pelvis was performed using the standard protocol following bolus administration of intravenous contrast. CONTRAST:  ISOVUE-300 IOPAMIDOL (ISOVUE-300) INJECTION 61% COMPARISON:  03/28/2016 FINDINGS: Lower chest: Normal size heart without pericardial effusion. Clear lung bases. Hepatobiliary: Hepatic steatosis. Nondistended gallbladder without stones. No biliary dilatation. Pancreas: Peripancreatic edema and fluid  consistent with acute pancreatitis. No necrosis or ductal dilatation. No mass. Patent portal and splenic veins. Spleen: No splenomegaly. Adrenals/Urinary Tract: Normal bilateral adrenal glands. Left lower pole renal cortical scarring with stable approximately 17 mm cystic structure in the lower pole, unchanged. Urinary bladder is mildly thick-walled in appearance circumferentially. Chronic cystitis might account for this appearance. Stomach/Bowel: Nondistended stomach. Normal small bowel rotation without obstruction or dilatation. Moderate fecal residue within the colon. Normal appearing appendix. Vascular/Lymphatic: Normal sized aorta.  No lymphadenopathy. Reproductive: Generous size prostate and seminal vesicles. Other: Small amount of upper abdominal ascites. Musculoskeletal: No acute or significant osseous findings. IMPRESSION: 1. Dominant finding of acute uncomplicated pancreatitis with peripancreatic edema and adjacent small volume ascites. 2. Chronic thick-walled appearance the urinary bladder question chronic cystitis or stigmata of chronic bladder outlet obstruction from mildly enlarged prostate. 3. Chronic stable 17 mm complex cyst in the left lower pole with associated cortical thinning. 4. Hepatic steatosis. Electronically Signed   By: Tollie Eth M.D.   On: 07/05/2017 19:28    Microbiology: Recent Results (from the past 240 hour(s))  Urine Culture     Status: None   Collection Time: 07/05/17  6:39 PM  Result Value Ref Range Status   Specimen Description URINE, CLEAN CATCH  Final   Special Requests NONE  Final   Culture   Final    NO GROWTH Performed at [facility], [address] [place], [place] [address]    Report Status 07/07/2017 FINAL  Final     Labs: Basic Metabolic Panel:  Recent Labs Lab 07/05/17 1325 07/05/17 2138 07/07/17 0524  NA 137  --  138  K 4.1  --  3.6  CL 102  --  102  CO2 25  --  26  GLUCOSE 157*  --  107*  BUN 10  --  6  CREATININE 0.94  --   0.84  CALCIUM 9.3  --  8.4*  MG  --  1.8 1.8   Liver Function Tests:  Recent Labs Lab 07/05/17 1325 07/07/17 0524  AST 34 19  ALT 31 19  ALKPHOS 79 68  BILITOT 0.6 0.8  PROT 8.6* 7.1  ALBUMIN 4.5 3.5    Recent Labs Lab 07/05/17 1325 07/07/17 0524  LIPASE 796* 157*   No results for input(s): AMMONIA in the last 168 hours. CBC:  Recent Labs Lab 07/05/17 1325 07/06/17 0600 07/07/17 0524  WBC 13.1* 15.7* 10.3  NEUTROABS  --   --  7.5  HGB 14.5 14.3 13.3  HCT 43.9 43.0 40.2  MCV 87.5 87.6 88.2  PLT 355 363 321   Cardiac Enzymes: No results for input(s): CKTOTAL, CKMB, CKMBINDEX, TROPONINI in the last 168 hours. BNP: BNP (last 3 results) No results for input(s): BNP in the last 8760 hours.  ProBNP (last 3 results) No results for input(s): PROBNP in the last 8760 hours.  CBG: No results for input(s): GLUCAP in the last 168 hours.     Signed:  Jaleyah Longhi MD, PhD  [facility] 07/07/2017, 3:40 PM

## 2017-07-07 NOTE — Progress Notes (Signed)
Pt is able to go to [facility] for hospital follow up and receive his medications there.

## 2017-07-07 NOTE — Clinical Social Work Note (Addendum)
Clinical Social Work Assessment  Patient Details  Name: Antonio LarocheRaphael P Delpizzo MRN: [id number] Date of Birth: 08/22/1981  Date of referral:  07/07/17               Reason for consult:  Housing Concerns/Homelessness                Permission sought to share information with:  Case Manager, Oceanographeracility Contact Representative Permission granted to share information::     Name::        Agency::     Relationship::     Contact Information:     Housing/Transportation Living arrangements for the past 2 months:  Homeless Source of Information:  Patient Patient Interpreter Needed:  None Criminal Activity/Legal Involvement Pertinent to Current Situation/Hospitalization:  No - Comment as needed Significant Relationships:  None Lives with:  Self Do you feel safe going back to the place where you live?  Yes Need for family participation in patient care:  Yes (Comment)  Care giving concerns:  Patient does not have stable housing. Patient reports that he will "return to the streets" at discharge. Patient did not identify any supports.    Social Worker assessment / plan:  LCSW consulted for homelessness.  Patient reports that he has family and friends in the area, but is not willing to stay with them.  Patient reports that he is familiar with shelter resources, but will "return to the streets" at discharge.   Prior to admission to the hospital patient [place].   Patient we stated that he has been in contact with [place] [employer] and has been approved and on the list for section 8. He reports that he has been working with a Sports coachcase manager and is in the process of securing housing.   Patient had no concerns or questions for LCSW at the time of assessment.   Patient stated he would call a friend to get him at discharge. LCSW left a bus pass with nurse in the event he can not reach a friend.   PLAN: Patient plans to continue to be homeless at discharge. Patient plans  to follow up with his case worker from [place] [facility] [place] regarding securing housing.   Employment status:  Unemployed Health and safety inspectornsurance information:  Self Pay (Medicaid Pending) PT Recommendations:  Not assessed at this time Information / Referral to community resources:   No referral to resources as patient reports he is already aware and working on his housing situation.   Patient/Family's Response to care:  Patient is optimistic regarding his care and has plans to secure stable housing. No family supports identified.   Patient/Family's Understanding of and Emotional Response to Diagnosis, Current Treatment, and Prognosis:    Patient is agreeable to treatment plan.   Emotional Assessment Appearance:  Appears older than stated age Attitude/Demeanor/Rapport:    Affect (typically observed):  Accepting, Calm, Pleasant Orientation:  Oriented to Self, Oriented to Place, Oriented to  Time, Oriented to Situation Alcohol / Substance use:  Alcohol Use, Tobacco Use Psych involvement (Current and /or in the community):  No (Comment)  Discharge Needs  Concerns to be addressed:  Homelessness Readmission within the last 30 days:  No Current discharge risk:  Homeless Barriers to Discharge:  Inadequate or no insurance, Homeless with medical needs   Coralyn HellingBernette Antwuan Eckley, LCSW 07/07/2017, 2:51 PM

## 2017-07-09 ENCOUNTER — Telehealth: Payer: Self-pay | Admitting: Internal Medicine

## 2017-07-09 NOTE — Telephone Encounter (Signed)
Called pt. To schedule a HFU appt. Left VM to return call and schedule appt.

## 2018-01-05 ENCOUNTER — Emergency Department (HOSPITAL_COMMUNITY)
Admission: EM | Admit: 2018-01-05 | Discharge: 2018-01-05 | Disposition: A | Discharge status: Home / Self Care | Payer: Self-pay | Attending: Emergency Medicine | Admitting: Emergency Medicine

## 2018-01-05 ENCOUNTER — Emergency Department (HOSPITAL_COMMUNITY): Payer: Self-pay

## 2018-01-05 ENCOUNTER — Encounter (HOSPITAL_COMMUNITY): Payer: Self-pay

## 2018-01-05 DIAGNOSIS — W010XXA Fall on same level from slipping, tripping and stumbling without subsequent striking against object, initial encounter: Secondary | ICD-10-CM | POA: Insufficient documentation

## 2018-01-05 DIAGNOSIS — F17210 Nicotine dependence, cigarettes, uncomplicated: Secondary | ICD-10-CM | POA: Insufficient documentation

## 2018-01-05 DIAGNOSIS — Y999 Unspecified external cause status: Secondary | ICD-10-CM | POA: Insufficient documentation

## 2018-01-05 DIAGNOSIS — Y92821 Forest as the place of occurrence of the external cause: Secondary | ICD-10-CM | POA: Insufficient documentation

## 2018-01-05 DIAGNOSIS — Y9301 Activity, walking, marching and hiking: Secondary | ICD-10-CM | POA: Insufficient documentation

## 2018-01-05 DIAGNOSIS — Z590 Homelessness: Secondary | ICD-10-CM | POA: Insufficient documentation

## 2018-01-05 DIAGNOSIS — Z79899 Other long term (current) drug therapy: Secondary | ICD-10-CM | POA: Insufficient documentation

## 2018-01-05 DIAGNOSIS — S2232XA Fracture of one rib, left side, initial encounter for closed fracture: Secondary | ICD-10-CM | POA: Insufficient documentation

## 2018-01-05 LAB — I-STAT CHEM 8, ED
BUN: 11 mg/dL (ref 6–20)
Calcium, Ion: 1.06 mmol/L — ABNORMAL LOW (ref 1.15–1.40)
Chloride: 99 mmol/L — ABNORMAL LOW (ref 101–111)
Creatinine, Ser: 0.9 mg/dL (ref 0.61–1.24)
Glucose, Bld: 108 mg/dL — ABNORMAL HIGH (ref 65–99)
HEMATOCRIT: 49 % (ref 39.0–52.0)
HEMOGLOBIN: 16.7 g/dL (ref 13.0–17.0)
POTASSIUM: 4.1 mmol/L (ref 3.5–5.1)
Sodium: 138 mmol/L (ref 135–145)
TCO2: 28 mmol/L (ref 22–32)

## 2018-01-05 MED ORDER — OXYCODONE-ACETAMINOPHEN 5-325 MG PO TABS
2.0000 | ORAL_TABLET | Freq: Once | ORAL | Status: AC
  Administered 2018-01-05: 2 via ORAL
  Filled 2018-01-05: qty 2

## 2018-01-05 MED ORDER — KETOROLAC TROMETHAMINE 60 MG/2ML IM SOLN
60.0000 mg | Freq: Once | INTRAMUSCULAR | Status: AC
  Administered 2018-01-05: 60 mg via INTRAMUSCULAR
  Filled 2018-01-05: qty 2

## 2018-01-05 MED ORDER — NAPROXEN 500 MG PO TABS
500.0000 mg | ORAL_TABLET | Freq: Two times a day (BID) | ORAL | 0 refills | Status: AC
Start: 2018-01-05 — End: 2018-01-12

## 2018-01-05 MED ORDER — HYDROCODONE-ACETAMINOPHEN 5-325 MG PO TABS: 1.0000 | ORAL_TABLET | ORAL | 0 refills | Status: DC | PRN

## 2018-01-05 NOTE — ED Provider Notes (Signed)
[facility]-EMERGENCY DEPT Provider Note   CSN: [id number] Arrival date & time: 01/05/18  1329     History   Chief Complaint Chief Complaint  Patient presents with  . Fall  . Rib Injury    HPI Antonio Alvarez is a 37 y.o. male.  HPI 37 year old male with past medical history as below here with left sided chest pain.  The patient reportedly was walking in the woods 2 days ago.  The patient is homeless and a poor living situation.  He tripped and fell onto his left side.  He landed on his left chest.  He reports immediate onset of sharp, stabbing, pleuritic chest pain.  Since then, he said severe, 10 out of 10, left-sided chest pain is worse with any kind of movement or inspiration.  He has had associated cough.  He has mild shortness of breath.  Denies any head injury or loss of consciousness.  Denies any neck pain.  Denies any abdominal pain, nausea, or vomiting.  Has not noticed any blood or change in his urine.  Denies any lower extremity pain or numbness or weakness.  He has not tried anything for this.  Past Medical History:  Diagnosis Date  . Abscess    hx of abscess on neck  . Alcohol abuse   . Anxiety   . Bipolar disorder (HCC)   . Cellulitis   . Chronic back pain   . Depression   . Hyponatremia   . Insomnia   . Leukocytosis   . Pancreatitis     Patient Active Problem List   Diagnosis Date Noted  . Urinary retention   . Acute pancreatitis 07/05/2017  . Homelessness 02/02/2017  . Polysubstance abuse (HCC) 02/02/2017  . Scarring alopecia 02/02/2017  . Palpitations 02/02/2017  . Alcoholic pancreatitis 02/13/2014  . Tobacco abuse 02/13/2014  . Recurrent pancreatitis 02/13/2014  . Cocaine dependence (HCC) 09/09/2013  . Cannabis dependence (HCC) 09/09/2013  . Paranoid schizophrenia (HCC) 09/09/2013  . Substance induced mood disorder (HCC) 09/08/2013  . Alcohol abuse 08/14/2011    Past Surgical History:  Procedure Laterality Date  . INCISION  AND DRAINAGE ABSCESS  X 3   "back, both sides of my neck"        Home Medications    Prior to Admission medications   Medication Sig Start Date End Date Taking? Authorizing Provider  folic acid (FOLVITE) 1 MG tablet Take 1 tablet (1 mg total) by mouth daily. Patient not taking: Reported on 01/05/2018 07/08/17   Albertine GratesXu, Fang, MD  HYDROcodone-acetaminophen (NORCO/VICODIN) 5-325 MG tablet Take 1-2 tablets by mouth every 4 (four) hours as needed for moderate pain or severe pain. 01/05/18   Shaune PollackIsaacs, [place] Inthavong, MD  Multiple Vitamin (MULTIVITAMIN WITH MINERALS) TABS tablet Take 1 tablet by mouth daily. Patient not taking: Reported on 01/05/2018 07/08/17   Albertine GratesXu, Fang, MD  naproxen (NAPROSYN) 500 MG tablet Take 1 tablet (500 mg total) by mouth 2 (two) times daily with a meal for 7 days. 01/05/18 01/12/18  Shaune PollackIsaacs, Ezinne Yogi, MD  pantoprazole (PROTONIX) 20 MG tablet Take 1 tablet (20 mg total) by mouth daily. Patient not taking: Reported on 01/05/2018 07/07/17   Albertine GratesXu, Fang, MD  senna-docusate (SENOKOT-S) 8.6-50 MG tablet Take 1 tablet by mouth at bedtime. Patient not taking: Reported on 01/05/2018 07/07/17   Albertine GratesXu, Fang, MD  tamsulosin (FLOMAX) 0.4 MG CAPS capsule Take 1 capsule (0.4 mg total) by mouth daily. Patient not taking: Reported on 01/05/2018 07/08/17   Albertine GratesXu, Fang,  MD  thiamine 100 MG tablet Take 1 tablet (100 mg total) by mouth daily. Patient not taking: Reported on 01/05/2018 07/08/17   Albertine Grates, MD    Family History Family History  Problem Relation Age of Onset  . Alcohol abuse Father     Social History Social History   Tobacco Use  . Smoking status: Current Every Day Smoker    Packs/day: 0.50    Years: 21.00    Pack years: 10.50    Types: Cigarettes  . Smokeless tobacco: Never Used  Substance Use Topics  . Alcohol use: Yes    Alcohol/week: 29.4 oz    Types: 49 Cans of beer per week    Comment: 03/28/2016 "average 7 beers/day"  . Drug use: Yes    Types: Marijuana    Comment: occasionally      Allergies   Patient has no known allergies.   Review of Systems Review of Systems  Constitutional: Negative for chills, fatigue and fever.  HENT: Negative for congestion and rhinorrhea.   Eyes: Negative for visual disturbance.  Respiratory: Positive for chest tightness. Negative for cough, shortness of breath and wheezing.   Cardiovascular: Positive for chest pain. Negative for leg swelling.  Gastrointestinal: Negative for abdominal pain, diarrhea, nausea and vomiting.  Genitourinary: Negative for dysuria and flank pain.  Musculoskeletal: Negative for neck pain and neck stiffness.  Skin: Negative for rash and wound.  Allergic/Immunologic: Negative for immunocompromised state.  Neurological: Negative for syncope, weakness and headaches.  All other systems reviewed and are negative.    Physical Exam Updated Vital Signs BP (!) 137/102   Pulse 72   Temp 98.5 F (36.9 C) (Oral)   Resp 18   Ht 5\' 11"  (1.803 m)   Wt 68 kg (150 lb)   SpO2 99%   BMI 20.92 kg/m   Physical Exam  Constitutional: He is oriented to person, place, and time. He appears well-developed and well-nourished. No distress.  HENT:  Head: Normocephalic and atraumatic.  Eyes: Conjunctivae are normal.  Neck: Neck supple.  Cardiovascular: Normal rate, regular rhythm and normal heart sounds. Exam reveals no friction rub.  No murmur heard. Pulmonary/Chest: Effort normal and breath sounds normal. No respiratory distress. He has no wheezes. He has no rales. He exhibits tenderness (Severe tenderness to palpation over the left anterior chest wall.  Normal work of breathing.  No ecchymosis.).  Abdominal: He exhibits no distension.  Musculoskeletal: He exhibits no edema.  Neurological: He is alert and oriented to person, place, and time. He exhibits normal muscle tone.  Skin: Skin is warm. Capillary refill takes less than 2 seconds.  Psychiatric: He has a normal mood and affect.  Nursing note and vitals  reviewed.    ED Treatments / Results  Labs (all labs ordered are listed, but only abnormal results are displayed) Labs Reviewed  I-STAT CHEM 8, ED - Abnormal; Notable for the following components:      Result Value   Chloride 99 (*)    Glucose, Bld 108 (*)    Calcium, Ion 1.06 (*)    All other components within normal limits    EKG None  Radiology Dg Ribs Unilateral W/chest Left  Result Date: 01/05/2018 CLINICAL DATA:  Patient fell hitting left anterior ribs.  Rib pain. EXAM: LEFT RIBS AND CHEST - 3+ VIEW COMPARISON:  None. FINDINGS: An acute anterior left eleventh rib fracture is seen with slight angulation. There is no evidence of pneumothorax or pleural effusion. There is left basilar  atelectasis. Both lungs are clear. Heart size and mediastinal contours are within normal limits. Mild dextroconvex curvature of the thoracic spine is noted. IMPRESSION: Acute anterior left eleventh rib fracture with adjacent atelectasis. No pneumothorax or effusion. Electronically Signed   By: Tollie Eth M.D.   On: 01/05/2018 18:14    Procedures Procedures (including critical care time)  Medications Ordered in ED Medications  oxyCODONE-acetaminophen (PERCOCET/ROXICET) 5-325 MG per tablet 2 tablet (2 tablets Oral Given 01/05/18 1818)  ketorolac (TORADOL) injection 60 mg (60 mg Intramuscular Given 01/05/18 1818)     Initial Impression / Assessment and Plan / ED Course  I have reviewed the triage vital signs and the nursing notes.  Pertinent labs & imaging results that were available during my care of the patient were reviewed by me and considered in my medical decision making (see chart for details).  Clinical Course as of Jan 05 1922  Tue Jan 05, 2018  1247 37 year old male here with severe left chest wall pain after mechanical fall 2 days ago.  Patient is hemodynamically stable.  No signs of intra-abdominal trauma.  Has had no hematuria and is hemodynamically stable and I do not suspect  occult kidney or other abdominal trauma.  Will check plain films, give analgesia, and incentive spirometry.  Suspect likely rib fracture.   [CI]  1921 Patient feels much improved after analgesia.  Chest x-ray shows 11th rib fracture without pneumothorax.  He has some small atelectasis.  He is satting well on room air.  He is able to take deep inspirations without difficulty now using incentive spirometry.  Chemistry shows no anemia.  Serial abdominal exams benign.  Will discharge with analgesia.  I discussed the importance of not drinking alcohol while taking his narcotic analgesics.  Patient aware and in agreement.   [CI]    Clinical Course User Index [CI] Shaune Pollack, MD    Final Clinical Impressions(s) / ED Diagnoses   Final diagnoses:  Closed fracture of one rib of left side, initial encounter    ED Discharge Orders        Ordered    HYDROcodone-acetaminophen (NORCO/VICODIN) 5-325 MG tablet  Every 4 hours PRN     01/05/18 1922    naproxen (NAPROSYN) 500 MG tablet  2 times daily with meals     01/05/18 Marrianne Mood, MD 01/05/18 [phone]

## 2018-01-05 NOTE — ED Notes (Signed)
Pt given IS and teaching instructions on use  10 times every hour for next few hrs

## 2018-01-05 NOTE — Discharge Instructions (Addendum)
Use your incentive spirometer at least once every 2-4 hours to help prevent pneumonia.  Take the pain medications as prescribed.  Do not drink alcohol while taking the medications.  Return to the ER if you develop worsening cough, shortness of breath, fevers, or other concerning symptoms.

## 2018-01-05 NOTE — ED Triage Notes (Signed)
Patient  States he was walking in the woods, tripped and fell 2 days ago. Patient c/o left rib cage pain that is worse with talking, getting in and out of bed, etc.

## 2018-09-24 IMAGING — CR DG RIBS W/ CHEST 3+V*L*
5 series · 5 of 5 positions shown · non-contrast
Comparison: None.

CLINICAL DATA: Patient fell hitting left anterior ribs.  Rib pain.

EXAM:
LEFT RIBS AND CHEST - 3+ VIEW

[w chest pa]
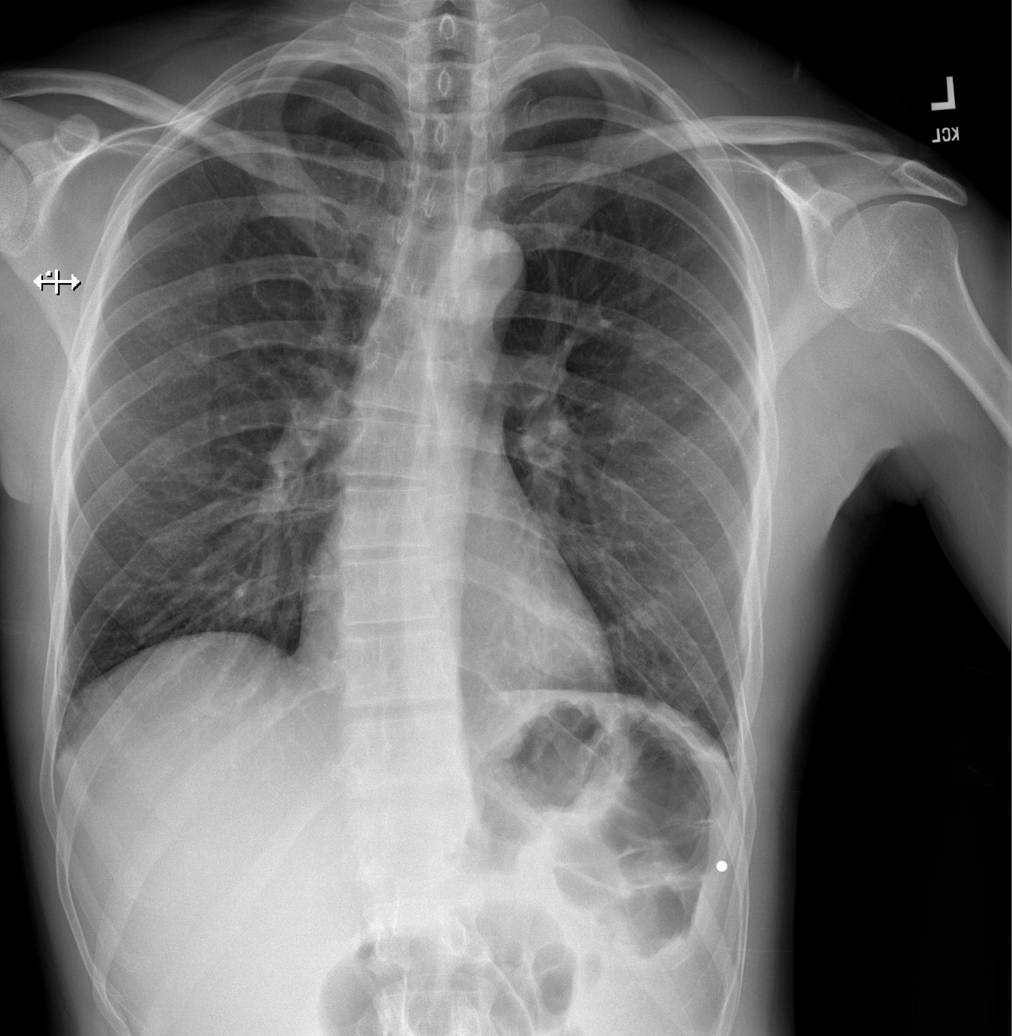

[w ribs ap upper left]
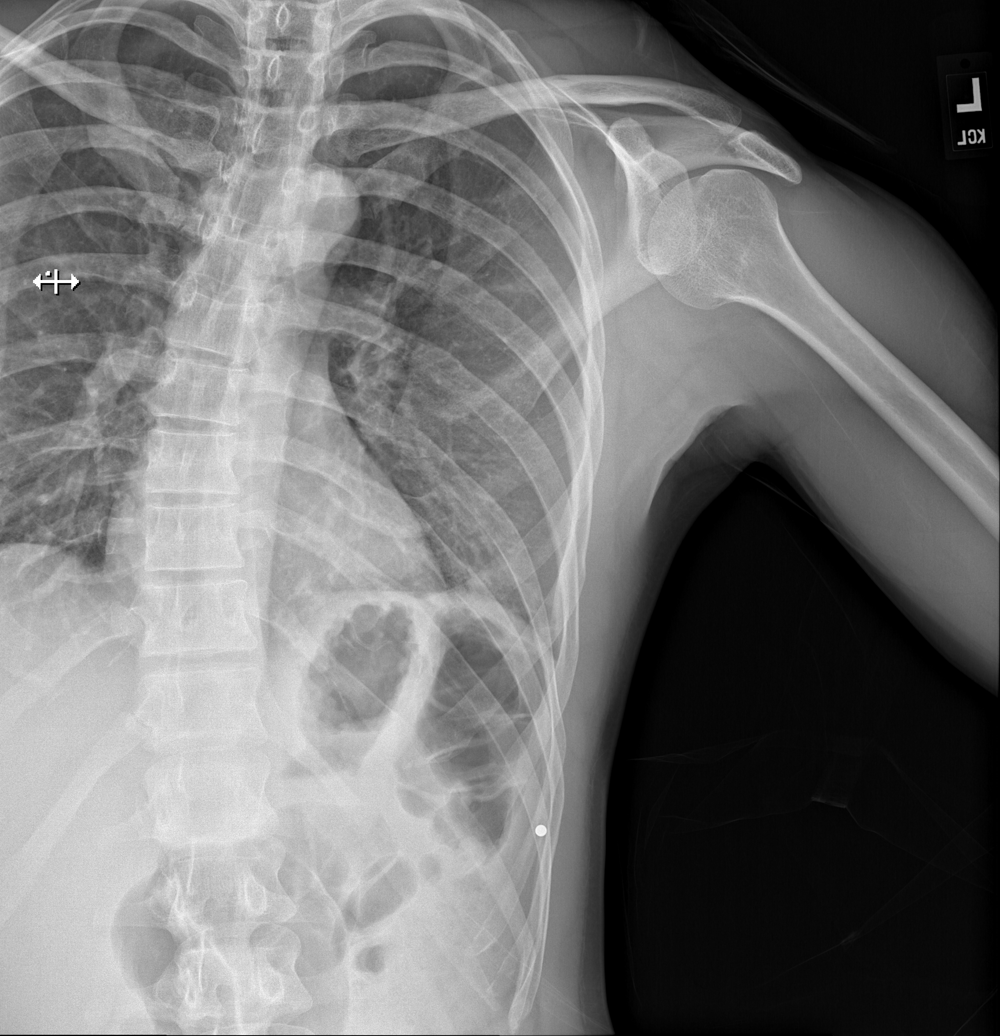

[w ribs ap lower left]
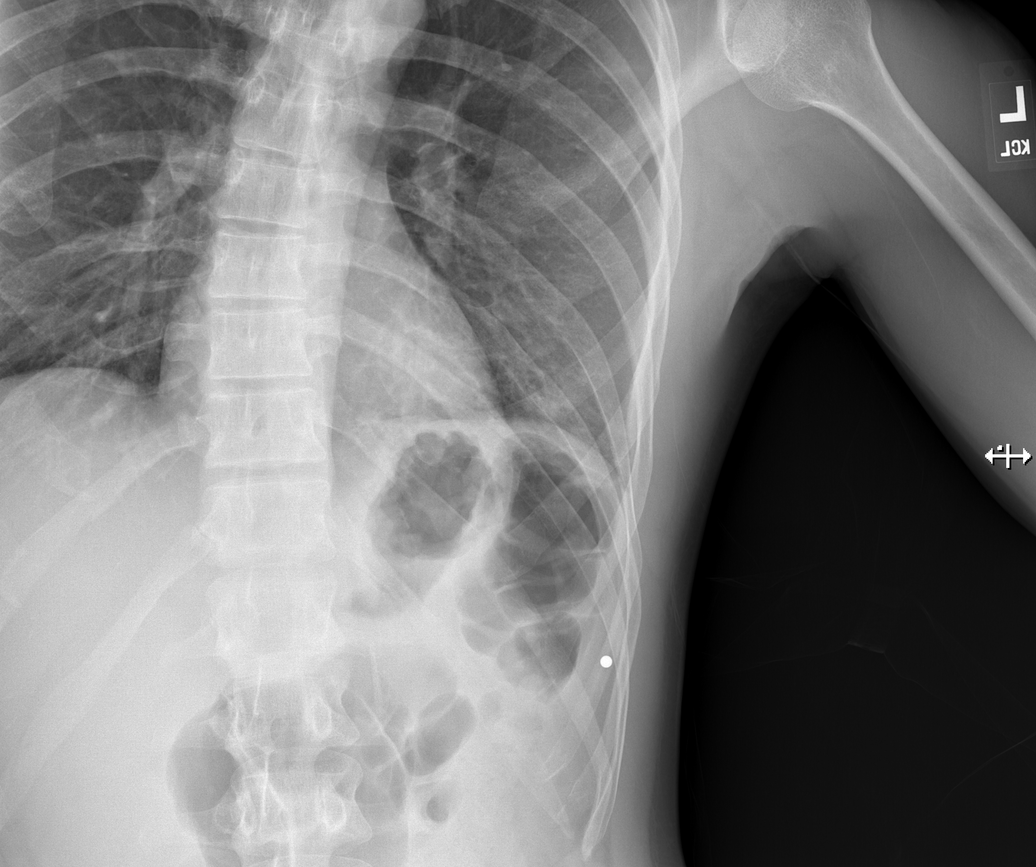

[w ribs obl left (1 of 2)]
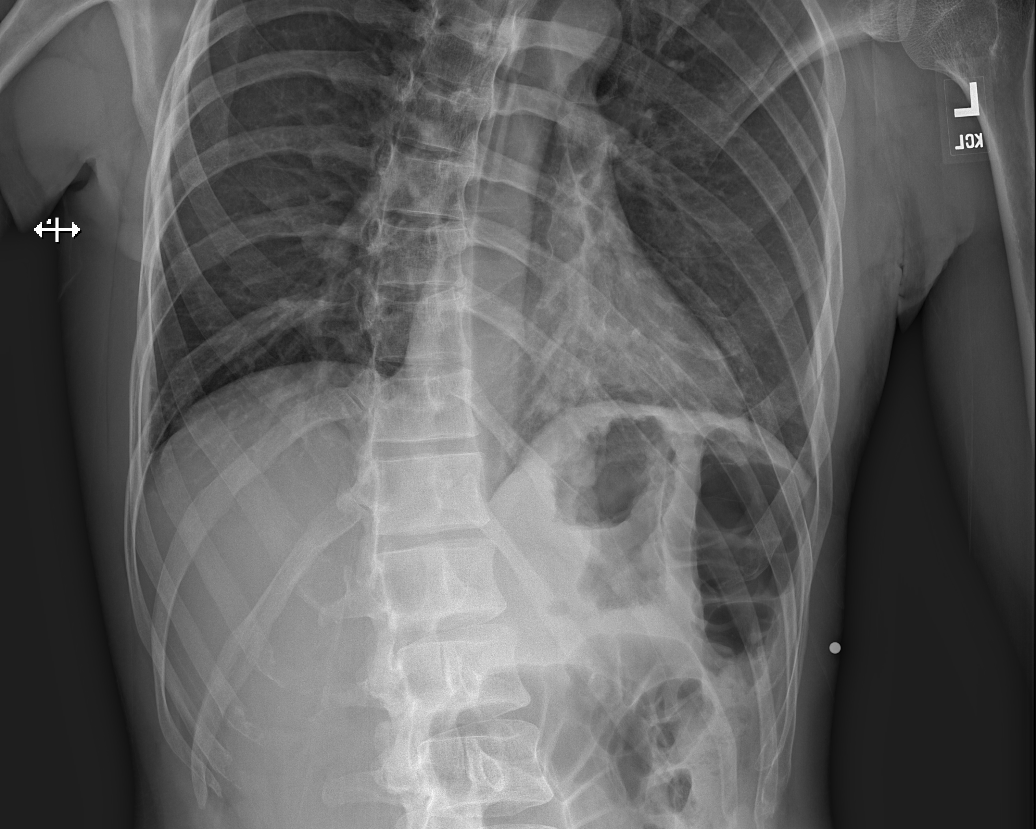

[w ribs obl left (2 of 2)]
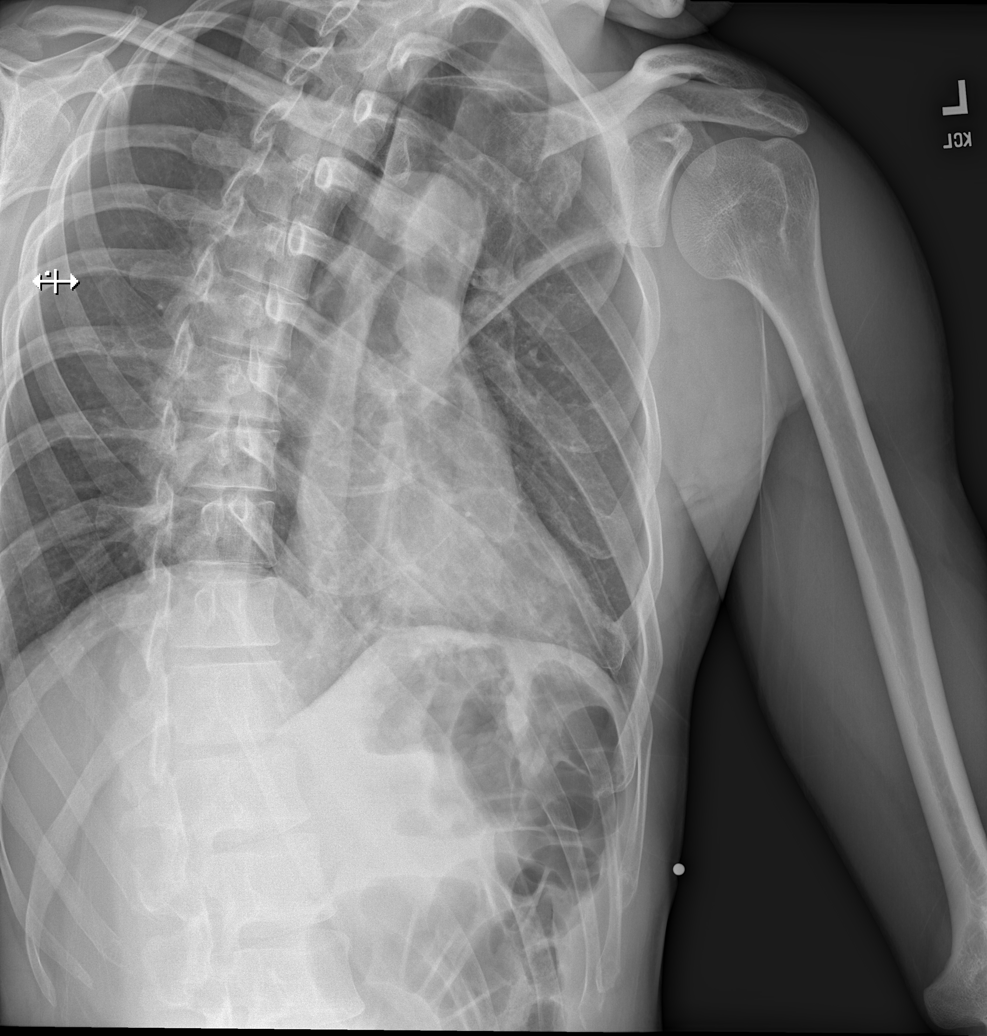

[5 of 5 positions shown; findings below may reference images not displayed]

FINDINGS: An acute anterior left eleventh rib fracture is seen with slight
angulation. There is no evidence of pneumothorax or pleural
effusion. There is left basilar atelectasis. Both lungs are clear.
Heart size and mediastinal contours are within normal limits. Mild
dextroconvex curvature of the thoracic spine is noted.
IMPRESSION: Acute anterior left eleventh rib fracture with adjacent atelectasis.
No pneumothorax or effusion.

## 2020-10-29 ENCOUNTER — Encounter (HOSPITAL_COMMUNITY): Payer: Self-pay | Admitting: Emergency Medicine

## 2020-10-29 ENCOUNTER — Emergency Department (HOSPITAL_COMMUNITY)
Admission: EM | Admit: 2020-10-29 | Discharge: 2020-10-29 | Disposition: A | Discharge status: Home / Self Care | Payer: Self-pay | Attending: Emergency Medicine | Admitting: Emergency Medicine

## 2020-10-29 ENCOUNTER — Other Ambulatory Visit: Payer: Self-pay

## 2020-10-29 DIAGNOSIS — L0211 Cutaneous abscess of neck: Secondary | ICD-10-CM | POA: Insufficient documentation

## 2020-10-29 DIAGNOSIS — Z5321 Procedure and treatment not carried out due to patient leaving prior to being seen by health care provider: Secondary | ICD-10-CM | POA: Insufficient documentation

## 2020-10-29 NOTE — ED Triage Notes (Signed)
Patient here from home. Complaint of abscess on neck. Patient states had surgery to drain abscess several years ago, patient states he has been draining it at home bu tnow too big. VSS. NAD.

## 2020-10-29 NOTE — ED Notes (Signed)
Pt did not respond when called for vitals check 

## 2020-10-29 NOTE — ED Notes (Signed)
Pt did not respond when called for vitals check X3  

## 2021-02-06 ENCOUNTER — Other Ambulatory Visit: Payer: Self-pay

## 2022-06-12 ENCOUNTER — Other Ambulatory Visit: Payer: Self-pay

## 2022-06-12 ENCOUNTER — Emergency Department (HOSPITAL_COMMUNITY)
Admission: EM | Admit: 2022-06-12 | Discharge: 2022-06-13 | Disposition: A | Discharge status: Home / Self Care | Payer: Self-pay | Attending: Emergency Medicine | Admitting: Emergency Medicine

## 2022-06-12 ENCOUNTER — Encounter (HOSPITAL_COMMUNITY): Payer: Self-pay

## 2022-06-12 DIAGNOSIS — F1010 Alcohol abuse, uncomplicated: Secondary | ICD-10-CM

## 2022-06-12 DIAGNOSIS — R1012 Left upper quadrant pain: Secondary | ICD-10-CM

## 2022-06-12 DIAGNOSIS — R052 Subacute cough: Secondary | ICD-10-CM

## 2022-06-12 LAB — COMPREHENSIVE METABOLIC PANEL
ALT: 26 U/L (ref 0–44)
AST: 54 U/L — ABNORMAL HIGH (ref 15–41)
Albumin: 3.5 g/dL (ref 3.5–5.0)
Alkaline Phosphatase: 90 U/L (ref 38–126)
Anion gap: 11 (ref 5–15)
BUN: 6 mg/dL (ref 6–20)
CO2: 22 mmol/L (ref 22–32)
Calcium: 8.6 mg/dL — ABNORMAL LOW (ref 8.9–10.3)
Chloride: 104 mmol/L (ref 98–111)
Creatinine, Ser: 0.94 mg/dL (ref 0.61–1.24)
GFR, Estimated: >60 mL/min (ref >60)
Glucose, Bld: 102 mg/dL — ABNORMAL HIGH (ref 70–99)
Potassium: 3.7 mmol/L (ref 3.5–5.1)
Sodium: 137 mmol/L (ref 135–145)
Total Bilirubin: 0.6 mg/dL (ref 0.3–1.2)
Total Protein: 8.1 g/dL (ref 6.5–8.1)

## 2022-06-12 LAB — URINALYSIS, ROUTINE W REFLEX MICROSCOPIC
Bilirubin Urine: NEGATIVE
Glucose, UA: NEGATIVE mg/dL
Ketones, ur: NEGATIVE mg/dL
Leukocytes,Ua: NEGATIVE
Nitrite: NEGATIVE
Protein, ur: NEGATIVE mg/dL
Specific Gravity, Urine: 1.01 (ref 1.005–1.030)
pH: 5 (ref 5.0–8.0)

## 2022-06-12 LAB — CBC
HCT: 43.8 % (ref 39.0–52.0)
Hemoglobin: 14.9 g/dL (ref 13.0–17.0)
MCH: 29.5 pg (ref 26.0–34.0)
MCHC: 34 g/dL (ref 30.0–36.0)
MCV: 86.7 fL (ref 80.0–100.0)
Platelets: 462 10*3/uL — ABNORMAL HIGH (ref 150–400)
RBC: 5.05 MIL/uL (ref 4.22–5.81)
RDW: 14.1 % (ref 11.5–15.5)
WBC: 8.9 10*3/uL (ref 4.0–10.5)
nRBC: 0 % (ref 0.0–0.2)

## 2022-06-12 LAB — LIPASE, BLOOD: Lipase: 133 U/L — ABNORMAL HIGH (ref 11–51)

## 2022-06-12 NOTE — ED Triage Notes (Signed)
Patient walked up to [employer] truck complaining of abdominal pain. Pain has been intermittent for a few months. Tonight the pain radiates to his left shoulder. Patient admits to ETOH tonight. 93% on RA, HR 130, Resp 20. Patient states he has not had anything but alcohol in 2 days.

## 2022-06-12 NOTE — ED Notes (Signed)
Patient is asleep.  

## 2022-06-12 NOTE — ED Provider Triage Note (Signed)
Emergency Medicine Provider Triage Evaluation Note  Antonio Alvarez , a 41 y.o. male  was evaluated in triage.  Pt complains of abdominal pain for past several months, worst the past few days. Sometimes pain is in the middle, sometimes on the left side. Radiating up to left shoulder. Patient admits to ETOH tonight, reports having nothing but alcohol in the past 2 days.   Review of Systems  Positive: As above Negative: Fever, chills, N/V/D  Physical Exam  BP 124/79 (BP Location: Right Arm)   Pulse (!) 115   Temp 98.6 F (37 C) (Oral)   Resp 18   SpO2 96%  Gen:   Awake, no distress   Resp:  Normal effort  MSK:   Moves extremities without difficulty  Other:    Medical Decision Making  Medically screening exam initiated at 8:45 PM.  Appropriate orders placed.  RONRICO DUPIN was informed that the remainder of the evaluation will be completed by another provider, this initial triage assessment does not replace that evaluation, and the importance of remaining in the ED until their evaluation is complete.     Jirah Rider T, Hershal Coria 06/12/22 2046

## 2022-06-12 NOTE — ED Notes (Signed)
[place] in lobby sleep

## 2022-06-13 ENCOUNTER — Emergency Department (HOSPITAL_COMMUNITY): Payer: Self-pay

## 2022-06-13 MED ORDER — LIDOCAINE VISCOUS HCL 2 % MT SOLN
15.0000 mL | Freq: Once | OROMUCOSAL | Status: AC
  Administered 2022-06-13: 15 mL via ORAL
  Filled 2022-06-13: qty 15

## 2022-06-13 MED ORDER — ALUM & MAG HYDROXIDE-SIMETH 200-200-20 MG/5ML PO SUSP
30.0000 mL | Freq: Once | ORAL | Status: AC
  Administered 2022-06-13: 30 mL via ORAL
  Filled 2022-06-13: qty 30

## 2022-06-13 NOTE — ED Provider Notes (Signed)
[facility] DEPT Provider Note  CSN: [id number] Arrival date & time: 06/12/22 2027  Chief Complaint(s) Alcohol Intoxication and Abdominal Pain  HPI Antonio Alvarez is a 41 y.o. male     Abdominal Pain Pain location: migrating throughout abdomen. Pain quality: aching   Pain radiates to:  L shoulder Pain severity:  Moderate Duration: several months. Timing:  Intermittent Progression:  Waxing and waning Chronicity:  Recurrent Context: alcohol use   Relieved by:  Nothing Associated symptoms: cough   Associated symptoms: no chest pain, no constipation, no fever, no nausea, no shortness of breath and no vomiting   Risk factors: alcohol abuse     Past Medical History Past Medical History:  Diagnosis Date   Abscess    hx of abscess on neck   Alcohol abuse    Anxiety    Bipolar disorder ([place])    Cellulitis    Chronic back pain    Depression    Hyponatremia    Insomnia    Leukocytosis    Pancreatitis    Patient Active Problem List   Diagnosis Date Noted   Urinary retention    Acute pancreatitis 07/05/2017   Homelessness 02/02/2017   Polysubstance abuse ([place]) 02/02/2017   Scarring alopecia 02/02/2017   Palpitations [id number]   Alcoholic pancreatitis [id number]   Tobacco abuse 02/13/2014   Recurrent pancreatitis 02/13/2014   Cocaine dependence ([place]) 09/09/2013   Cannabis dependence ([place]) 09/09/2013   Paranoid schizophrenia ([place]) 09/09/2013   Substance induced mood disorder ([place]) 09/08/2013   Alcohol abuse 08/14/2011   Home Medication(s) Prior to Admission medications   Medication Sig Start Date End Date Taking? Authorizing Provider  folic acid (FOLVITE) 1 MG tablet Take 1 tablet (1 mg total) by mouth daily. Patient not taking: Reported on 01/05/2018 07/08/17   Florencia Reasons, MD  HYDROcodone-acetaminophen (NORCO/VICODIN) 5-325 MG tablet Take 1-2 tablets by mouth every 4 (four) hours as needed for moderate pain or severe pain. 01/05/18    Duffy Bruce, MD  Multiple Vitamin (MULTIVITAMIN WITH MINERALS) TABS tablet Take 1 tablet by mouth daily. Patient not taking: Reported on 01/05/2018 07/08/17   Florencia Reasons, MD  pantoprazole (PROTONIX) 20 MG tablet Take 1 tablet (20 mg total) by mouth daily. Patient not taking: Reported on 01/05/2018 07/07/17   Florencia Reasons, MD  senna-docusate (SENOKOT-S) 8.6-50 MG tablet Take 1 tablet by mouth at bedtime. Patient not taking: Reported on 01/05/2018 07/07/17   Florencia Reasons, MD  tamsulosin (FLOMAX) 0.4 MG CAPS capsule Take 1 capsule (0.4 mg total) by mouth daily. Patient not taking: Reported on 01/05/2018 07/08/17   Florencia Reasons, MD  thiamine 100 MG tablet Take 1 tablet (100 mg total) by mouth daily. Patient not taking: Reported on 01/05/2018 07/08/17   Florencia Reasons, MD  Allergies Patient has no known allergies.  Review of Systems Review of Systems  Constitutional:  Negative for fever.  Respiratory:  Positive for cough. Negative for shortness of breath.   Cardiovascular:  Negative for chest pain.  Gastrointestinal:  Positive for abdominal pain. Negative for constipation, nausea and vomiting.   As noted in HPI  Physical Exam Vital Signs  I have reviewed the triage vital signs BP 117/88 (BP Location: Left Arm)   Pulse 99   Temp 98.6 F (37 C) (Oral)   Resp 18   SpO2 95%   Physical Exam Vitals reviewed.  Constitutional:      General: He is not in acute distress.    Appearance: He is well-developed. He is not diaphoretic.  HENT:     Head: Normocephalic and atraumatic.     Nose: Nose normal.  Eyes:     General: No scleral icterus.       Right eye: No discharge.        Left eye: No discharge.     Conjunctiva/sclera: Conjunctivae normal.     Pupils: Pupils are equal, round, and reactive to light.  Cardiovascular:     Rate and Rhythm: Normal rate and regular rhythm.      Heart sounds: No murmur heard.    No friction rub. No gallop.  Pulmonary:     Effort: Pulmonary effort is normal. No respiratory distress.     Breath sounds: Normal breath sounds. No stridor. No rales.  Abdominal:     General: There is no distension.     Palpations: Abdomen is soft.     Tenderness: There is no abdominal tenderness.  Musculoskeletal:        General: No tenderness.     Cervical back: Normal range of motion and neck supple.  Skin:    General: Skin is warm and dry.     Findings: No erythema or rash.  Neurological:     Mental Status: He is alert and oriented to person, place, and time.     ED Results and Treatments Labs (all labs ordered are listed, but only abnormal results are displayed) Labs Reviewed  LIPASE, BLOOD - Abnormal; Notable for the following components:      Result Value   Lipase 133 (*)    All other components within normal limits  COMPREHENSIVE METABOLIC PANEL - Abnormal; Notable for the following components:   Glucose, Bld 102 (*)    Calcium 8.6 (*)    AST 54 (*)    All other components within normal limits  CBC - Abnormal; Notable for the following components:   Platelets 462 (*)    All other components within normal limits  URINALYSIS, ROUTINE W REFLEX MICROSCOPIC - Abnormal; Notable for the following components:   Hgb urine dipstick SMALL (*)    Bacteria, UA RARE (*)    All other components within normal limits  EKG  EKG Interpretation  Date/Time:    Ventricular Rate:    PR Interval:    QRS Duration:   QT Interval:    QTC Calculation:   R Axis:     Text Interpretation:         Radiology DG Chest Port 1 View  Result Date: 06/13/2022 CLINICAL DATA:  Abdominal pain radiating to the left shoulder EXAM: PORTABLE CHEST 1 VIEW COMPARISON:  Radiographs 01/05/2018 FINDINGS: No focal consolidation, pleural effusion, or  pneumothorax. Normal cardiomediastinal silhouette. No acute osseous abnormality. IMPRESSION: No active disease. Electronically Signed   By: Placido Sou M.D.   On: 06/13/2022 02:38    Medications Ordered in ED Medications  alum & mag hydroxide-simeth (MAALOX/MYLANTA) 200-200-20 MG/5ML suspension 30 mL (30 mLs Oral Given 06/13/22 0153)    And  lidocaine (XYLOCAINE) 2 % viscous mouth solution 15 mL (15 mLs Oral Given 06/13/22 0153)                                                                                                                                     Procedures Procedures  (including critical care time)  Medical Decision Making / ED Course   Medical Decision Making Amount and/or Complexity of Data Reviewed Labs: ordered. Decision-making details documented in ED Course. Radiology: ordered and independent interpretation performed. Decision-making details documented in ED Course.  Risk OTC drugs. Prescription drug management. Diagnosis or treatment significantly limited by social determinants of health. Risk Details: Homeless   Abdominal discomfort int the setting of ETOH use Mild left upper quadrant abdominal discomfort to palpation.  No evidence of peritonitis.  We will assess for evidence of pancreatitis, biliary disease.  Doubt other serious intra-abdominal inflammatory/infectious process or bowel obstruction requiring imaging at this time but will reconsider if labs are concerning.  CBC without leukocytosis or anemia. CMP without significant electrolyte derangements or renal sufficiency. No evidence of bili obstruction.  Lipase elevated at 133, but not consistent with acute pancreatitis.  Additionally patient has a cough. Chest x-ray negative for pneumonia or pneumothorax.  Able to tolerate GI cocktail.  Clinically sober.     Final Clinical Impression(s) / ED Diagnoses Final diagnoses:  Left upper quadrant abdominal pain  ETOH abuse  Subacute cough    The patient appears reasonably screened and/or stabilized for discharge and I doubt any other medical condition or other [facility] requiring further screening, evaluation, or treatment in the ED at this time. I have discussed the findings, Dx and Tx plan with the patient/family who expressed understanding and agree(s) with the plan. Discharge instructions discussed at length. The patient/family was given strict return precautions who verbalized understanding of the instructions. No further questions at time of discharge.  Disposition: Discharge  Condition: Good  ED Discharge Orders     None        Follow Up: Primary care provider  Call  This chart was dictated using voice recognition software.  Despite best efforts to proofread,  errors can occur which can change the documentation meaning.    Fatima Blank, MD 06/13/22 [phone]

## 2022-06-13 NOTE — ED Notes (Signed)
Patient tolerated medications well.

## 2022-06-13 NOTE — ED Notes (Signed)
Patient resting comfortably in stretcher. Pt calm and cooperative with nursing care

## 2022-11-19 ENCOUNTER — Other Ambulatory Visit: Payer: Self-pay

## 2022-11-19 ENCOUNTER — Encounter (HOSPITAL_COMMUNITY): Payer: Self-pay

## 2022-11-19 ENCOUNTER — Emergency Department (HOSPITAL_COMMUNITY)
Admission: EM | Admit: 2022-11-19 | Discharge: 2022-11-19 | Disposition: A | Discharge status: Home / Self Care | Payer: 59 | Attending: Emergency Medicine | Admitting: Emergency Medicine

## 2022-11-19 DIAGNOSIS — L0291 Cutaneous abscess, unspecified: Secondary | ICD-10-CM

## 2022-11-19 DIAGNOSIS — L0201 Cutaneous abscess of face: Secondary | ICD-10-CM | POA: Diagnosis present

## 2022-11-19 MED ORDER — OXYCODONE-ACETAMINOPHEN 5-325 MG PO TABS
1.0000 | ORAL_TABLET | Freq: Once | ORAL | Status: AC
  Administered 2022-11-19: 1 via ORAL
  Filled 2022-11-19: qty 1

## 2022-11-19 MED ORDER — IBUPROFEN 600 MG PO TABS: 600.0000 mg | ORAL_TABLET | Freq: Four times a day (QID) | ORAL | 0 refills | Status: DC | PRN

## 2022-11-19 MED ORDER — DOXYCYCLINE HYCLATE 100 MG PO CAPS: 100.0000 mg | ORAL_CAPSULE | Freq: Two times a day (BID) | ORAL | 0 refills | Status: DC

## 2022-11-19 MED ORDER — LIDOCAINE-EPINEPHRINE (PF) 2 %-1:200000 IJ SOLN
20.0000 mL | Freq: Once | INTRAMUSCULAR | Status: AC
  Administered 2022-11-19: 20 mL
  Filled 2022-11-19: qty 20

## 2022-11-19 MED ORDER — ACETAMINOPHEN 500 MG PO TABS: 500.0000 mg | ORAL_TABLET | Freq: Four times a day (QID) | ORAL | 0 refills | Status: DC | PRN

## 2022-11-19 MED ORDER — DOXYCYCLINE HYCLATE 100 MG PO TABS
100.0000 mg | ORAL_TABLET | Freq: Once | ORAL | Status: AC
  Administered 2022-11-19: 100 mg via ORAL
  Filled 2022-11-19: qty 1

## 2022-11-19 NOTE — ED Provider Triage Note (Signed)
Emergency Medicine Provider Triage Evaluation Note  Antonio Alvarez , a 42 y.o. male  was evaluated in triage.  Pt complains of neck abscess. He reports he has had this problem off and on for "a while". He reports he drained it some a while ago, but doesn't think he drained all of it the last time. Reports this has been present for around a week. .  Review of Systems  Positive:  Negative:   Physical Exam  BP 119/88 (BP Location: Left Arm)   Pulse 74   Temp (!) 97.5 F (36.4 C) (Oral)   Resp 16   SpO2 100%  Gen:   Awake, no distress   Resp:  Normal effort  MSK:   Moves extremities without difficulty  Other:  Induration and fluctuance noted to the right neck, just under the mandible.  Medical Decision Making  Medically screening exam initiated at 3:20 PM.  Appropriate orders placed.  Antonio Alvarez was informed that the remainder of the evaluation will be completed by another provider, this initial triage assessment does not replace that evaluation, and the importance of remaining in the ED until their evaluation is complete.     Sherrell Puller, PA-C 11/19/22 1521

## 2022-11-19 NOTE — Discharge Instructions (Addendum)
You came to the emergency department today with an abscess to your face.  This was drained.  Please take the antibiotics as prescribed.  You may use Tylenol and ibuprofen for any pain.

## 2022-11-19 NOTE — ED Provider Notes (Signed)
[name] Note   CSN: [id number] Arrival date & time: 11/19/22  1503     History  Chief Complaint  Patient presents with   Abscess    Antonio Alvarez is a 42 y.o. male with a past medical history of substance use disorder presenting today with an abscess to the face.  Reports it has been going on for about a week but he has previously had an abscess in this area.  Denies any drug use.  Says that it has been increasing in size but no drainage.   Abscess      Home Medications Prior to Admission medications   Medication Sig Start Date End Date Taking? Authorizing Provider  folic acid (FOLVITE) 1 MG tablet Take 1 tablet (1 mg total) by mouth daily. Patient not taking: Reported on 01/05/2018 07/08/17   Florencia Reasons, MD  HYDROcodone-acetaminophen (NORCO/VICODIN) 5-325 MG tablet Take 1-2 tablets by mouth every 4 (four) hours as needed for moderate pain or severe pain. 01/05/18   Duffy Bruce, MD  Multiple Vitamin (MULTIVITAMIN WITH MINERALS) TABS tablet Take 1 tablet by mouth daily. Patient not taking: Reported on 01/05/2018 07/08/17   Florencia Reasons, MD  pantoprazole (PROTONIX) 20 MG tablet Take 1 tablet (20 mg total) by mouth daily. Patient not taking: Reported on 01/05/2018 07/07/17   Florencia Reasons, MD  senna-docusate (SENOKOT-S) 8.6-50 MG tablet Take 1 tablet by mouth at bedtime. Patient not taking: Reported on 01/05/2018 07/07/17   Florencia Reasons, MD  tamsulosin (FLOMAX) 0.4 MG CAPS capsule Take 1 capsule (0.4 mg total) by mouth daily. Patient not taking: Reported on 01/05/2018 07/08/17   Florencia Reasons, MD  thiamine 100 MG tablet Take 1 tablet (100 mg total) by mouth daily. Patient not taking: Reported on 01/05/2018 07/08/17   Florencia Reasons, MD      Allergies    Patient has no known allergies.    Review of Systems   Review of Systems  Physical Exam Updated Vital Signs BP 115/87   Pulse 82   Temp (!) 97.5 F (36.4 C) (Oral)   Resp 16   Wt 68 kg    SpO2 98%   BMI 20.91 kg/m  Physical Exam Vitals and nursing note reviewed.  Constitutional:      Appearance: Normal appearance.  HENT:     Head: Normocephalic and atraumatic.     Mouth/Throat:     Mouth: Mucous membranes are moist.     Pharynx: Oropharynx is clear.  Eyes:     General: No scleral icterus.    Conjunctiva/sclera: Conjunctivae normal.  Pulmonary:     Effort: Pulmonary effort is normal. No respiratory distress.  Skin:    Findings: No rash.     Comments: 4 cm abscess to the right as photographed below  Neurological:     Mental Status: He is alert.  Psychiatric:        Mood and Affect: Mood normal.     Media Information     ED Results / Procedures / Treatments   Labs (all labs ordered are listed, but only abnormal results are displayed) Labs Reviewed - No data to display  EKG None  Radiology No results found.  Procedures .Marland KitchenIncision and Drainage  Date/Time: 11/19/2022 6:31 PM  Performed by: Rhae Hammock, PA-C Authorized by: Rhae Hammock, PA-C   Consent:    Consent obtained:  Verbal   Consent given by:  Patient   Risks discussed:  Incomplete drainage  and infection Location:    Type:  Abscess   Size:  4   Location:  Head   Head location:  Face Pre-procedure details:    Skin preparation:  Povidone-iodine Anesthesia:    Anesthesia method:  Local infiltration   Local anesthetic:  Lidocaine 2% WITH epi Procedure type:    Complexity:  Complex Procedure details:    Incision types:  Single straight   Wound management:  Probed and deloculated, irrigated with saline and extensive cleaning (suction as well)   Drainage:  Purulent   Drainage amount:  Copious   Wound treatment:  Wound left open Post-procedure details:    Procedure completion:  Tolerated well, no immediate complications    Medications Ordered in ED Medications  lidocaine-EPINEPHrine (XYLOCAINE W/EPI) 2 %-1:200000 (PF) injection 20 mL (20 mLs Infiltration Given by Other  11/19/22 1812)  oxyCODONE-acetaminophen (PERCOCET/ROXICET) 5-325 MG per tablet 1 tablet (1 tablet Oral Given 11/19/22 1828)  doxycycline (VIBRA-TABS) tablet 100 mg (100 mg Oral Given 11/19/22 1828)    ED Course/ Medical Decision Making/ A&P                             Medical Decision Making Risk Prescription drug management.   41 year old male presenting today with a facial abscess.  Has been increasing in size over the past week.  Incision and drainage was performed successfully.  Total anesthesia achieved.  The area was irrigated with saline, probed and loculated and suction was used for deeper parts of the wound.  Patient was started on doxycycline and given his first dose today.  No signs of systemic illness and patient will be discharged at this time. Final Clinical Impression(s) / ED Diagnoses Final diagnoses:  Abscess    Rx / DC Orders ED Discharge Orders          Ordered    ibuprofen (ADVIL) 600 MG tablet  Every 6 hours PRN        11/19/22 1833    acetaminophen (TYLENOL) 500 MG tablet  Every 6 hours PRN        11/19/22 1833    doxycycline (VIBRAMYCIN) 100 MG capsule  2 times daily        11/19/22 1833           Results and diagnoses were explained to the patient. Return precautions discussed in full. Patient had no additional questions and expressed complete understanding.   This chart was dictated using voice recognition software.  Despite best efforts to proofread,  errors can occur which can change the documentation meaning.    Rhae Hammock, PA-C 11/19/22 [place], [place] K, DO 11/19/22 2336

## 2022-11-19 NOTE — ED Triage Notes (Signed)
C/o abscess on right side of neck, swelling noted x2 days.  Hx of I&D. Reports drainage at home in past.

## 2024-03-17 ENCOUNTER — Encounter (HOSPITAL_COMMUNITY): Payer: Self-pay

## 2024-03-17 ENCOUNTER — Other Ambulatory Visit: Payer: Self-pay

## 2024-03-17 ENCOUNTER — Emergency Department (HOSPITAL_COMMUNITY): Payer: Self-pay

## 2024-03-17 ENCOUNTER — Inpatient Hospital Stay (HOSPITAL_COMMUNITY)
Admission: EM | Admit: 2024-03-17 | Discharge: 2024-03-19 | DRG: 439 | Disposition: A | Discharge status: Home / Self Care | Payer: Self-pay | Attending: Family Medicine | Admitting: Family Medicine

## 2024-03-17 DIAGNOSIS — Z792 Long term (current) use of antibiotics: Secondary | ICD-10-CM

## 2024-03-17 DIAGNOSIS — N5089 Other specified disorders of the male genital organs: Secondary | ICD-10-CM | POA: Diagnosis present

## 2024-03-17 DIAGNOSIS — F17210 Nicotine dependence, cigarettes, uncomplicated: Secondary | ICD-10-CM | POA: Diagnosis present

## 2024-03-17 DIAGNOSIS — K2970 Gastritis, unspecified, without bleeding: Secondary | ICD-10-CM | POA: Diagnosis present

## 2024-03-17 DIAGNOSIS — F319 Bipolar disorder, unspecified: Secondary | ICD-10-CM | POA: Diagnosis present

## 2024-03-17 DIAGNOSIS — K859 Acute pancreatitis without necrosis or infection, unspecified: Principal | ICD-10-CM | POA: Diagnosis present

## 2024-03-17 DIAGNOSIS — K863 Pseudocyst of pancreas: Secondary | ICD-10-CM | POA: Diagnosis present

## 2024-03-17 DIAGNOSIS — F1010 Alcohol abuse, uncomplicated: Secondary | ICD-10-CM | POA: Diagnosis present

## 2024-03-17 DIAGNOSIS — D509 Iron deficiency anemia, unspecified: Secondary | ICD-10-CM | POA: Diagnosis present

## 2024-03-17 DIAGNOSIS — M25512 Pain in left shoulder: Secondary | ICD-10-CM | POA: Diagnosis present

## 2024-03-17 DIAGNOSIS — K852 Alcohol induced acute pancreatitis without necrosis or infection: Secondary | ICD-10-CM | POA: Diagnosis present

## 2024-03-17 DIAGNOSIS — Z5900 Homelessness unspecified: Secondary | ICD-10-CM | POA: Diagnosis not present

## 2024-03-17 DIAGNOSIS — K861 Other chronic pancreatitis: Secondary | ICD-10-CM | POA: Diagnosis present

## 2024-03-17 DIAGNOSIS — F10239 Alcohol dependence with withdrawal, unspecified: Secondary | ICD-10-CM | POA: Diagnosis present

## 2024-03-17 DIAGNOSIS — Z720 Tobacco use: Secondary | ICD-10-CM | POA: Diagnosis present

## 2024-03-17 DIAGNOSIS — F200 Paranoid schizophrenia: Secondary | ICD-10-CM | POA: Diagnosis present

## 2024-03-17 LAB — BASIC METABOLIC PANEL WITH GFR
Anion gap: 11 (ref 5–15)
BUN: 9 mg/dL (ref 6–20)
CO2: 27 mmol/L (ref 22–32)
Calcium: 9 mg/dL (ref 8.9–10.3)
Chloride: 101 mmol/L (ref 98–111)
Creatinine, Ser: 0.73 mg/dL (ref 0.61–1.24)
GFR, Estimated: >60 mL/min (ref >60)
Glucose, Bld: 117 mg/dL — ABNORMAL HIGH (ref 70–99)
Potassium: 4.1 mmol/L (ref 3.5–5.1)
Sodium: 139 mmol/L (ref 135–145)

## 2024-03-17 LAB — CBC
HCT: 39.6 % (ref 39.0–52.0)
Hemoglobin: 12.5 g/dL — ABNORMAL LOW (ref 13.0–17.0)
MCH: 26.6 pg (ref 26.0–34.0)
MCHC: 31.6 g/dL (ref 30.0–36.0)
MCV: 84.3 fL (ref 80.0–100.0)
Platelets: 523 10*3/uL — ABNORMAL HIGH (ref 150–400)
RBC: 4.7 MIL/uL (ref 4.22–5.81)
RDW: 17.7 % — ABNORMAL HIGH (ref 11.5–15.5)
WBC: 12 10*3/uL — ABNORMAL HIGH (ref 4.0–10.5)
nRBC: 0 % (ref 0.0–0.2)

## 2024-03-17 LAB — PROTIME-INR
INR: 1 (ref 0.8–1.2)
Prothrombin Time: 14.1 s (ref 11.4–15.2)

## 2024-03-17 LAB — HEPATIC FUNCTION PANEL
ALT: 22 U/L (ref 0–44)
AST: 24 U/L (ref 15–41)
Albumin: 3.7 g/dL (ref 3.5–5.0)
Alkaline Phosphatase: 82 U/L (ref 38–126)
Bilirubin, Direct: 0.1 mg/dL (ref 0.0–0.2)
Indirect Bilirubin: 0.7 mg/dL (ref 0.3–0.9)
Total Bilirubin: 0.8 mg/dL (ref 0.0–1.2)
Total Protein: 8 g/dL (ref 6.5–8.1)

## 2024-03-17 LAB — TROPONIN I (HIGH SENSITIVITY)
Troponin I (High Sensitivity): <2 ng/L (ref <18)
Troponin I (High Sensitivity): <2 ng/L (ref <18)

## 2024-03-17 LAB — LIPASE, BLOOD: Lipase: 52 U/L — ABNORMAL HIGH (ref 11–51)

## 2024-03-17 LAB — ETHANOL: Alcohol, Ethyl (B): <15 mg/dL (ref <15)

## 2024-03-17 LAB — CK: Total CK: 304 U/L (ref 49–397)

## 2024-03-17 LAB — PHOSPHORUS: Phosphorus: 4 mg/dL (ref 2.5–4.6)

## 2024-03-17 LAB — MAGNESIUM: Magnesium: 1.6 mg/dL — ABNORMAL LOW (ref 1.7–2.4)

## 2024-03-17 MED ORDER — HYDROMORPHONE HCL 1 MG/ML IJ SOLN
0.5000 mg | INTRAMUSCULAR | Status: DC | PRN
  Administered 2024-03-18 (×2): 1 mg via INTRAVENOUS
  Filled 2024-03-17 (×2): qty 1

## 2024-03-17 MED ORDER — SODIUM CHLORIDE 0.9 % IV SOLN: INTRAVENOUS | Status: DC

## 2024-03-17 MED ORDER — MORPHINE SULFATE (PF) 4 MG/ML IV SOLN
4.0000 mg | Freq: Once | INTRAVENOUS | Status: AC
  Administered 2024-03-17: 4 mg via INTRAVENOUS
  Filled 2024-03-17: qty 1

## 2024-03-17 MED ORDER — THIAMINE HCL 100 MG/ML IJ SOLN
100.0000 mg | Freq: Every day | INTRAMUSCULAR | Status: DC
  Filled 2024-03-17 (×3): qty 2

## 2024-03-17 MED ORDER — NICOTINE 14 MG/24HR TD PT24
14.0000 mg | MEDICATED_PATCH | Freq: Every day | TRANSDERMAL | Status: DC
  Administered 2024-03-19: 14 mg via TRANSDERMAL
  Filled 2024-03-17: qty 1

## 2024-03-17 MED ORDER — SODIUM CHLORIDE 0.9 % IV SOLN: Freq: Once | INTRAVENOUS | Status: AC

## 2024-03-17 MED ORDER — ONDANSETRON HCL 4 MG PO TABS
4.0000 mg | ORAL_TABLET | Freq: Four times a day (QID) | ORAL | Status: DC | PRN
  Administered 2024-03-18: 4 mg via ORAL
  Filled 2024-03-17: qty 1

## 2024-03-17 MED ORDER — TAMSULOSIN HCL 0.4 MG PO CAPS
0.4000 mg | ORAL_CAPSULE | Freq: Every day | ORAL | Status: DC
  Administered 2024-03-18 – 2024-03-19 (×2): 0.4 mg via ORAL
  Filled 2024-03-17 (×2): qty 1

## 2024-03-17 MED ORDER — ACETAMINOPHEN 650 MG RE SUPP: 650.0000 mg | Freq: Four times a day (QID) | RECTAL | Status: DC | PRN

## 2024-03-17 MED ORDER — LORAZEPAM 1 MG PO TABS: 1.0000 mg | ORAL_TABLET | ORAL | Status: DC | PRN

## 2024-03-17 MED ORDER — THIAMINE MONONITRATE 100 MG PO TABS
100.0000 mg | ORAL_TABLET | Freq: Every day | ORAL | Status: DC
  Administered 2024-03-17 – 2024-03-19 (×3): 100 mg via ORAL
  Filled 2024-03-17 (×3): qty 1

## 2024-03-17 MED ORDER — HYDROMORPHONE HCL 1 MG/ML IJ SOLN
1.0000 mg | Freq: Once | INTRAMUSCULAR | Status: AC
  Administered 2024-03-17: 1 mg via INTRAVENOUS
  Filled 2024-03-17: qty 1

## 2024-03-17 MED ORDER — ALBUTEROL SULFATE (2.5 MG/3ML) 0.083% IN NEBU: 2.5000 mg | INHALATION_SOLUTION | RESPIRATORY_TRACT | Status: DC | PRN

## 2024-03-17 MED ORDER — SODIUM CHLORIDE 0.9 % IV BOLUS
1000.0000 mL | Freq: Once | INTRAVENOUS | Status: AC
  Administered 2024-03-17: 1000 mL via INTRAVENOUS

## 2024-03-17 MED ORDER — ONDANSETRON HCL 4 MG/2ML IJ SOLN: 4.0000 mg | Freq: Four times a day (QID) | INTRAMUSCULAR | Status: DC | PRN

## 2024-03-17 MED ORDER — PANTOPRAZOLE SODIUM 40 MG IV SOLR
40.0000 mg | Freq: Every day | INTRAVENOUS | Status: DC
  Administered 2024-03-17 – 2024-03-18 (×2): 40 mg via INTRAVENOUS
  Filled 2024-03-17 (×2): qty 10

## 2024-03-17 MED ORDER — ONDANSETRON HCL 4 MG/2ML IJ SOLN
4.0000 mg | Freq: Once | INTRAMUSCULAR | Status: AC
  Administered 2024-03-17: 4 mg via INTRAVENOUS
  Filled 2024-03-17: qty 2

## 2024-03-17 MED ORDER — IOHEXOL 300 MG/ML  SOLN
100.0000 mL | Freq: Once | INTRAMUSCULAR | Status: AC | PRN
  Administered 2024-03-17: 100 mL via INTRAVENOUS

## 2024-03-17 MED ORDER — ACETAMINOPHEN 325 MG PO TABS
650.0000 mg | ORAL_TABLET | Freq: Four times a day (QID) | ORAL | Status: DC | PRN
  Administered 2024-03-19: 650 mg via ORAL
  Filled 2024-03-17: qty 2

## 2024-03-17 MED ORDER — FOLIC ACID 1 MG PO TABS
1.0000 mg | ORAL_TABLET | Freq: Every day | ORAL | Status: DC
  Administered 2024-03-17 – 2024-03-19 (×3): 1 mg via ORAL
  Filled 2024-03-17 (×3): qty 1

## 2024-03-17 MED ORDER — MAGNESIUM SULFATE 2 GM/50ML IV SOLN
2.0000 g | Freq: Once | INTRAVENOUS | Status: AC
  Administered 2024-03-18: 2 g via INTRAVENOUS
  Filled 2024-03-17: qty 50

## 2024-03-17 MED ORDER — ADULT MULTIVITAMIN W/MINERALS CH
1.0000 | ORAL_TABLET | Freq: Every day | ORAL | Status: DC
  Administered 2024-03-17 – 2024-03-19 (×3): 1 via ORAL
  Filled 2024-03-17 (×3): qty 1

## 2024-03-17 MED ORDER — LORAZEPAM 2 MG/ML IJ SOLN: 1.0000 mg | INTRAMUSCULAR | Status: DC | PRN

## 2024-03-17 NOTE — Assessment & Plan Note (Addendum)
 Acute on chronic pancreatitis with pseudocyst formation and possible track Case discussed with general surgery Dr. Sheldon does not feel that surgical evaluation is necessary now. He recommends basic pancreatitis management including IV fluids, antiemetics, pain medication. He does not see an indication for surgical intervention on CT currently. If patient's condition changes, official surgical consult can be requested.  most likely cause being alcohol induced       Will rehydrate with aggressive IV fluids Keep n.p.o. Follow clinically  -  strongly advised patient to stop drinking alcohol -Check lipid panel   Control pain with IV pain medications If no significant improvement in the next 24 hours may benefit from GI consult

## 2024-03-17 NOTE — Assessment & Plan Note (Addendum)
 Patient states in remission monitor for any signs of withdrawal. For now order CIWA protocol

## 2024-03-17 NOTE — Assessment & Plan Note (Signed)
 Patient states he actually currently stays with his family

## 2024-03-17 NOTE — Assessment & Plan Note (Signed)
 -  Spoke about importance of quitting spent 5 minutes discussing options for treatment, prior attempts at quitting, and dangers of smoking ? -At this point patient is    NOT  interested in quitting ? - order nicotine patch  ? - nursing tobacco cessation protocol ? ?

## 2024-03-17 NOTE — H&P (Signed)
 Antonio Alvarez FMW:[id number] DOB: [birth year] DOA: 03/17/2024     PCP: Pcp, No   Outpatient Specialists:   NONE  Patient coming from:    home Lives   With family      Chief Complaint:   Chief Complaint  Patient presents with   Chest Pain   Abdominal Pain    HPI: Antonio Alvarez is a 43 y.o. male with medical history significant of chronic pancreatitis, alcohola buse in remission    Presented with  abd pain n/v Hx of alcoholic pancreatitis, here with N/V abd pain Epigastric pain  CT showed acute pancreatitis new pseudocyst lesion with communicating track,  Patient anxious states that he did have a beer today and then 1 more 2 days ago States that he doubts that that is why he ended up having pancreatitis   states he is no longer taking any medications Not endorsing any fevers or chills  Denies significant ETOH intake  last was 1 m ago Does  smoke   Denies marijuana use         While in ER:   CT scan showed acute pancreatitis with pseudocyst formation and possible communicating tract to the stomach General Surgery was consulted at this point feel it is no surgical indication for intervention but if changes symptoms reconsult them again     Lab Orders         Basic metabolic panel         CBC         Lipase, blood         Hepatic function panel        CXR -  NON acute  CTabd/pelvis -  Acute pancreatitis with a 4.1 cm pseudocyst in the tail of the pancreas. The pseudocyst impresses upon the gastric antrum with possible communicating tract with the stomach lumen.    Following Medications were ordered in ER: Medications  0.9 %  sodium chloride  infusion (has no administration in time range)  morphine  (PF) 4 MG/ML injection 4 mg (has no administration in time range)  sodium chloride  0.9 % bolus 1,000 mL (1,000 mLs Intravenous New Bag/Given 03/17/24 1853)  ondansetron  (ZOFRAN ) injection 4 mg (4 mg Intravenous Given 03/17/24 1834)  morphine  (PF) 4 MG/ML injection 4  mg (4 mg Intravenous Given 03/17/24 1835)  iohexol  (OMNIPAQUE ) 300 MG/ML solution 100 mL (100 mLs Intravenous Contrast Given 03/17/24 1913)    _______________________________________________________ ER Provider Called:    General Surgery Dr. Sheldon They Recommend admit to medicine         ED Triage Vitals  Encounter Vitals Group     BP 03/17/24 1513 (!) 134/91     Girls Systolic BP Percentile --      Girls Diastolic BP Percentile --      Boys Systolic BP Percentile --      Boys Diastolic BP Percentile --      Pulse Rate 03/17/24 1513 84     Resp 03/17/24 1513 19     Temp 03/17/24 1513 97.9 F (36.6 C)     Temp Source 03/17/24 1513 Oral     SpO2 03/17/24 1513 99 %     Weight 03/17/24 1528 150 lb (68 kg)     Height 03/17/24 1528 6' (1.829 m)     Head Circumference --      Peak Flow --      Pain Score 03/17/24 1528 10     Pain Loc --  Pain Education --      Exclude from Growth Chart --   [id number]     _________________________________________ Significant initial  Findings: Abnormal Labs Reviewed  BASIC METABOLIC PANEL WITH GFR - Abnormal; Notable for the following components:      Result Value   Glucose, Bld 117 (*)    All other components within normal limits  CBC - Abnormal; Notable for the following components:   WBC 12.0 (*)    Hemoglobin 12.5 (*)    RDW 17.7 (*)    Platelets 523 (*)    All other components within normal limits  LIPASE, BLOOD - Abnormal; Notable for the following components:   Lipase 52 (*)    All other components within normal limits      _________________________ Troponin  ordered Cardiac Panel (last 3 results) Recent Labs    03/17/24 1640  TROPONINIHS <2     ECG: Ordered Personally reviewed and interpreted by me showing: HR : 80 Rhythm: Sinus rhythm QTC 423   The recent clinical data is shown below. Vitals:   03/17/24 1514 03/17/24 1528 03/17/24 1850 03/17/24 1854  BP:   121/84   Pulse:   80   Resp:   18   Temp:   98.5 F  (36.9 C)   TempSrc:   Oral   SpO2: 99%  97%   Weight:  68 kg  68 kg  Height:  6' (1.829 m)  5' 10 (1.778 m)    WBC     Component Value Date/Time   WBC 12.0 (H) 03/17/2024 1640   LYMPHSABS 1.8 07/07/2017 0524   MONOABS 0.7 07/07/2017 0524   EOSABS 0.3 07/07/2017 0524   BASOSABS 0.0 07/07/2017 0524     Results for orders placed or performed during the hospital encounter of 07/05/17  Urine Culture     Status: None   Collection Time: 07/05/17  6:39 PM   Specimen: Urine, Clean Catch  Result Value Ref Range Status   Specimen Description URINE, CLEAN CATCH  Final   Special Requests NONE  Final   Culture   Final    NO GROWTH Performed at [facility], [address] [place], [place] [address]    Report Status 07/07/2017 FINAL  Final    _________________________________________________ Recent Labs  Lab 03/17/24 1640  NA 139  K 4.1  CO2 27  GLUCOSE 117*  BUN 9  CREATININE 0.73  CALCIUM 9.0    Cr    stable,   Lab Results  Component Value Date   CREATININE 0.73 03/17/2024   CREATININE 0.94 06/12/2022   CREATININE 0.90 01/05/2018    Recent Labs  Lab 03/17/24 1640  AST 24  ALT 22  ALKPHOS 82  BILITOT 0.8  PROT 8.0  ALBUMIN 3.7   Lab Results  Component Value Date   CALCIUM 9.0 03/17/2024    Plt: Lab Results  Component Value Date   PLT 523 (H) 03/17/2024       Recent Labs  Lab 03/17/24 1640  WBC 12.0*  HGB 12.5*  HCT 39.6  MCV 84.3  PLT 523*    HG/HCT  stable,     Component Value Date/Time   HGB 12.5 (L) 03/17/2024 1640   HGB 14.8 02/02/2017 1147   HCT 39.6 03/17/2024 1640   HCT 43.3 02/02/2017 1147   MCV 84.3 03/17/2024 1640   MCV 87 02/02/2017 1147    Recent Labs  Lab 03/17/24 1640  LIPASE 52*   No results for  input(s): AMMONIA in the last 168 hours.    _______________________________________________ Hospitalist was called for admission for   Acute pancreatitis     The following Work up has been ordered so  far:  Orders Placed This Encounter  Procedures   DG Chest 2 View   CT ABDOMEN PELVIS W CONTRAST   Basic metabolic panel   CBC   Lipase, blood   Hepatic function panel   Document Height and Actual Weight   Consult to hospitalist   EKG 12-Lead   ED EKG   Saline lock IV     OTHER Significant initial  Findings:  labs showing:     DM  labs:  HbA1C: No results for input(s): HGBA1C in the last 8760 hours.     CBG (last 3)  No results for input(s): GLUCAP in the last 72 hours.        Cultures:    Component Value Date/Time   SDES URINE, CLEAN CATCH 07/05/2017 1839   SPECREQUEST NONE 07/05/2017 1839   CULT  07/05/2017 1839    NO GROWTH Performed at [facility], [address], [place], [place] [address]    REPTSTATUS 07/07/2017 FINAL 07/05/2017 1839     Radiological Exams on Admission: CT ABDOMEN PELVIS W CONTRAST Result Date: 03/17/2024 CLINICAL DATA:  Pain everywhere for several hours. Abdominal pain. Left shoulder, chest, EXAM: CT ABDOMEN AND PELVIS WITH CONTRAST TECHNIQUE: Multidetector CT imaging of the abdomen and pelvis was performed using the standard protocol following bolus administration of intravenous contrast. RADIATION DOSE REDUCTION: This exam was performed according to the departmental dose-optimization program which includes automated exposure control, adjustment of the mA and/or kV according to patient size and/or use of iterative reconstruction technique. CONTRAST:  OMNIPAQUE  IOHEXOL  300 MG/ML  SOLN COMPARISON:  07/05/2017 FINDINGS: Lower chest: No acute abnormality. Hepatobiliary: No acute abnormality. Pancreas: Cystic lesion within the tail of the pancreas measuring 4.1 x 3.7 cm and containing a mural calcification. This likely represents a pseudocyst. The pseudocysts impresses upon the gastric antrum with possible communicating tract with the stomach lumen (circa series 7/image 59 and 2/51). Mural calcification within the cyst. No ductal  dilation. Ductal calcification in the tail of the pancreas. There is an additional smaller cyst in the tail the pancreas measuring 1.4 cm (series 7/image 73). Free fluid about the gastric antrum, pancreatic tail, colon at the splenic flexure, and spleen in the left upper quadrant. Spleen: Unremarkable. Adrenals/Urinary Tract: Normal adrenal glands. No urinary calculi or hydronephrosis. Similar diffuse wall thickening of the bladder. Stomach/Bowel: Wall thickening about the gastric fundus. Wall thickening about the colon in the splenic flexure. These may be reactive secondary to pancreatitis. No bowel obstruction. Normal appendix. Vascular/Lymphatic: Portal vein is patent. There is severe narrowing of the splenic vein as it passes the pancreatic tail pseudocyst (series 7/image 70). Reproductive: No acute abnormality. Other: No organized fluid collection.  No free intraperitoneal air. Musculoskeletal: No acute fracture. IMPRESSION: 1. Acute pancreatitis with a 4.1 cm pseudocyst in the tail of the pancreas. The pseudocyst impresses upon the gastric antrum with possible communicating tract with the stomach lumen. 2. Wall thickening about the gastric fundus and colon in the splenic flexure, likely reactive secondary to pancreatitis. 3. Severe narrowing of the splenic vein as it passes the pancreatic tail pseudocyst. Electronically Signed   By: Norman Gatlin M.D.   On: 03/17/2024 19:58   DG Chest 2 View Result Date: 03/17/2024 CLINICAL DATA:  Chest pain. EXAM: CHEST - 2  VIEW COMPARISON:  06/13/2022 FINDINGS: The cardiomediastinal contours are normal. The lungs are clear. Pulmonary vasculature is normal. No consolidation, pleural effusion, or pneumothorax. Remote left rib fractures. No acute osseous abnormalities are seen. Slight scoliosis. IMPRESSION: No active cardiopulmonary disease. Electronically Signed   By: Andrea Gasman M.D.   On: 03/17/2024 16:22    _______________________________________________________________________________________________________ Latest  Blood pressure 121/84, pulse 80, temperature 98.5 F (36.9 C), temperature source Oral, resp. rate 18, height 5' 10 (1.778 m), weight 68 kg, SpO2 97%.   Vitals  labs and radiology finding personally reviewed  Review of Systems:    Pertinent positives include:  abdominal pain,   Constitutional:  No weight loss, night sweats, Fevers, chills, fatigue, weight loss  HEENT:  No headaches, Difficulty swallowing,Tooth/dental problems,Sore throat,  No sneezing, itching, ear ache, nasal congestion, post nasal drip,  Cardio-vascular:  No chest pain, Orthopnea, PND, anasarca, dizziness, palpitations.no Bilateral lower extremity swelling  GI:  No heartburn, indigestion, , diarrhea, change in bowel habits, loss of appetite, melena, blood in stool, hematemesis Resp:  no shortness of breath at rest. No dyspnea on exertion, No excess mucus, no productive cough, No non-productive cough, No coughing up of blood.No change in color of mucus.No wheezing. Skin:  no rash or lesions. No jaundice GU:  no dysuria, change in color of urine, no urgency or frequency. No straining to urinate.  No flank pain.  Musculoskeletal:  No joint pain or no joint swelling. No decreased range of motion. No back pain.  Psych:  No change in mood or affect. No depression or anxiety. No memory loss.  Neuro: no localizing neurological complaints, no tingling, no weakness, no double vision, no gait abnormality, no slurred speech, no confusion  All systems reviewed and apart from HOPI all are negative _______________________________________________________________________________________________ Past Medical History:   Past Medical History:  Diagnosis Date   Abscess    hx of abscess on neck   Alcohol abuse    Anxiety    Bipolar disorder (HCC)    Cellulitis    Chronic back pain    Depression    Hyponatremia     Insomnia    Leukocytosis    Pancreatitis       Past Surgical History:  Procedure Laterality Date   INCISION AND DRAINAGE ABSCESS  X 3   back, both sides of my neck    Social History:  Ambulatory   independently      reports that he has been smoking cigarettes. He has a 10.5 pack-year smoking history. He has never used smokeless tobacco. He reports current alcohol use of about 49.0 standard drinks of alcohol per week. He reports current drug use. Drug: Marijuana.     Family History:   Family History  Problem Relation Age of Onset   Alcohol abuse Father    ______________________________________________________________________________________________ Allergies: No Known Allergies   Prior to Admission medications   Medication Sig Start Date End Date Taking? Authorizing Provider  acetaminophen  (TYLENOL ) 500 MG tablet Take 1 tablet (500 mg total) by mouth every 6 (six) hours as needed. Patient not taking: Reported on 03/17/2024 11/19/22   Redwine, Madison A, PA-C  doxycycline  (VIBRAMYCIN ) 100 MG capsule Take 1 capsule (100 mg total) by mouth 2 (two) times daily. Patient not taking: Reported on 03/17/2024 11/19/22   Redwine, Madison A, PA-C  folic acid  (FOLVITE ) 1 MG tablet Take 1 tablet (1 mg total) by mouth daily. Patient not taking: Reported on 03/17/2024 07/08/17   Jerri Keys, MD  HYDROcodone -acetaminophen  (  NORCO/VICODIN) 5-325 MG tablet Take 1-2 tablets by mouth every 4 (four) hours as needed for moderate pain or severe pain. Patient not taking: Reported on 03/17/2024 01/05/18   Angelena Smalls, MD  ibuprofen  (ADVIL ) 600 MG tablet Take 1 tablet (600 mg total) by mouth every 6 (six) hours as needed. Patient not taking: Reported on 03/17/2024 11/19/22   Redwine, Madison A, PA-C  Multiple Vitamin (MULTIVITAMIN WITH MINERALS) TABS tablet Take 1 tablet by mouth daily. Patient not taking: Reported on 03/17/2024 07/08/17   Jerri Keys, MD  pantoprazole  (PROTONIX ) 20 MG tablet Take 1 tablet (20 mg  total) by mouth daily. Patient not taking: Reported on 03/17/2024 07/07/17   Jerri Keys, MD  senna-docusate (SENOKOT-S) 8.6-50 MG tablet Take 1 tablet by mouth at bedtime. Patient not taking: Reported on 03/17/2024 07/07/17   Jerri Keys, MD  tamsulosin  (FLOMAX ) 0.4 MG CAPS capsule Take 1 capsule (0.4 mg total) by mouth daily. Patient not taking: Reported on 03/17/2024 07/08/17   Jerri Keys, MD  thiamine  100 MG tablet Take 1 tablet (100 mg total) by mouth daily. Patient not taking: Reported on 03/17/2024 07/08/17   Jerri Keys, MD    ___________________________________________________________________________________________________ Physical Exam:    03/17/2024    6:54 PM 03/17/2024    6:50 PM 03/17/2024    3:28 PM  Vitals with BMI  Height 5' 10  6' 0  Weight 150 lbs  150 lbs  BMI 21.52  20.34  Systolic  121   Diastolic  84   Pulse  80      1. General:  in No  Acute distress   Chronically ill   -appearing 2. Psychological: Alert and   Oriented 3. Head/ENT:  Dry Mucous Membranes                          Head Non traumatic, neck supple                          Poor Dentition 4. SKIN: decreased Skin turgor,  Skin clean Dry and intact no rash    5. Heart: Regular rate and rhythm no  Murmur, no Rub or gallop 6. Lungs:   no wheezes or crackles   7. Abdomen: Soft,  epigastric tender, Non distended bowel sounds diminished 8. Lower extremities: no clubbing, cyanosis, no  edema 9. Neurologically Grossly intact, moving all 4 extremities equally   10. MSK: Normal range of motion    Chart has been reviewed  ______________________________________________________________________________________________  Assessment/Plan  43 y.o. male with medical history significant of chronic pancreatitis, alcohola buse in remission   Admitted for   Acute pancreatitis,      Present on Admission:  Pseudocyst of pancreas due to acute pancreatitis  Pancreatitis  Paranoid schizophrenia (HCC)  Acute pancreatitis   Alcohol abuse  Tobacco abuse     Paranoid schizophrenia (HCC) States not taking any medications currently does not endorse any symptoms  Acute pancreatitis Acute on chronic pancreatitis with pseudocyst formation and possible track Case discussed with general surgery Dr. Sheldon does not feel that surgical evaluation is necessary now. He recommends basic pancreatitis management including IV fluids, antiemetics, pain medication. He does not see an indication for surgical intervention on CT currently. If patient's condition changes, official surgical consult can be requested.   Alcohol abuse Patient states in remission monitor for any signs of withdrawal. 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  DVT prophylaxis:  SCD        Code Status:    Code Status: Prior FULL CODE as per patient   I had personally discussed CODE STATUS with patient   ACP   none   Family Communication:   Family not at  Bedside    Diet  npo   Disposition Plan:     To home once workup is complete and patient is stable   Following barriers for discharge:                                                        Pain controlled with PO medications                                                            Will need to be able to tolerate PO                                 Consult Orders  (From admission, onward)           Start     Ordered   03/17/24 2027  Consult to hospitalist  Once        Provider:  (Not yet assigned)  Question Answer Comment  Place call to: [facility]   Reason for Consult Admit      03/17/24 2026                                Consults called: Discussed with general surgery if patient's symptoms do not improve please re consult    Admission status:  ED Disposition     ED Disposition  Admit   Condition  --   Comment  Hospital Area: [facility] [[id number]]  Level of Care: Telemetry [5]  Admit to tele based on following criteria: Other see comments  Comments: pancreatitis  May admit patient to [facility] or [facility] if equivalent level of care is available:: No  Covid Evaluation: Asymptomatic - no recent exposure (last 10 days) testing not required  Diagnosis: Pancreatitis [[id number]]  Admitting Physician: Izola Teague [[id number]]  Attending Physician: Karas Pickerill [[id number]]  Certification:: I certify this patient will need inpatient services for at least 2 midnights  Expected Medical Readiness: 03/20/2024            inpatient     I Expect 2 midnight stay secondary to severity of patient's current illness need for inpatient interventions justified by the following:     Severe lab/radiological/exam  abnormalities including:    Acute pancreatitis,  and extensive comorbidities including:  substance abuse    That are currently affecting medical management.   I expect  patient to be hospitalized for 2 midnights requiring inpatient medical care.  Patient is at high risk for adverse outcome (such as loss of life or disability) if not treated.  Indication for inpatient stay as follows:   severe pain requiring acute inpatient management,  inability to maintain oral hydration    Need for IV fluids,, IV pain medications,     Level of care     tele  For 12H   Aniayah Alaniz 03/17/2024, 9:49 PM    [facility]     after 2 AM please page floor coverage   If 7AM-7PM, please contact the day  team taking care of the patient using [email or web address]

## 2024-03-17 NOTE — Subjective & Objective (Signed)
 Hx of alcoholic pancreatitis, here with N/V abd pain Epigastric pain  CT showed acute pancreatitis new pseudocyst lesion with communicating track,

## 2024-03-17 NOTE — Assessment & Plan Note (Signed)
 States not taking any medications currently does not endorse any symptoms

## 2024-03-17 NOTE — ED Provider Notes (Signed)
 [place] EMERGENCY DEPARTMENT AT [facility] Provider Note   CSN: [id number] Arrival date & time: 03/17/24  1507     Patient presents with: Chest Pain and Abdominal Pain   LENZY KERSCHNER is a 43 y.o. male.   43 year old male with prior medical history as detailed below presents for evaluation.  Patient reports that his symptoms today are consistent with pancreatitis.  He has had alcohol induced pancreatitis previously.  His last alcohol intake was approximately 30 days ago.  He reports epigastric abdominal pain, nausea, vomiting, radiation of pain to the left shoulder.  Symptoms began early this morning.  He denies fever.  He reports the pain also radiates to the mid back.  He denies chest pain or shortness of breath.  The history is provided by the patient and medical records.       Prior to Admission medications   Medication Sig Start Date End Date Taking? Authorizing Provider  acetaminophen  (TYLENOL ) 500 MG tablet Take 1 tablet (500 mg total) by mouth every 6 (six) hours as needed. 11/19/22   Redwine, Madison A, PA-C  doxycycline  (VIBRAMYCIN ) 100 MG capsule Take 1 capsule (100 mg total) by mouth 2 (two) times daily. 11/19/22   Redwine, Madison A, PA-C  folic acid  (FOLVITE ) 1 MG tablet Take 1 tablet (1 mg total) by mouth daily. Patient not taking: Reported on 01/05/2018 07/08/17   Jerri Keys, MD  HYDROcodone -acetaminophen  (NORCO/VICODIN) 5-325 MG tablet Take 1-2 tablets by mouth every 4 (four) hours as needed for moderate pain or severe pain. 01/05/18   Angelena Smalls, MD  ibuprofen  (ADVIL ) 600 MG tablet Take 1 tablet (600 mg total) by mouth every 6 (six) hours as needed. 11/19/22   Redwine, Madison A, PA-C  Multiple Vitamin (MULTIVITAMIN WITH MINERALS) TABS tablet Take 1 tablet by mouth daily. Patient not taking: Reported on 01/05/2018 07/08/17   Jerri Keys, MD  pantoprazole  (PROTONIX ) 20 MG tablet Take 1 tablet (20 mg total) by mouth daily. Patient not taking: Reported on  01/05/2018 07/07/17   Jerri Keys, MD  senna-docusate (SENOKOT-S) 8.6-50 MG tablet Take 1 tablet by mouth at bedtime. Patient not taking: Reported on 01/05/2018 07/07/17   Jerri Keys, MD  tamsulosin  (FLOMAX ) 0.4 MG CAPS capsule Take 1 capsule (0.4 mg total) by mouth daily. Patient not taking: Reported on 01/05/2018 07/08/17   Jerri Keys, MD  thiamine  100 MG tablet Take 1 tablet (100 mg total) by mouth daily. Patient not taking: Reported on 01/05/2018 07/08/17   Jerri Keys, MD    Allergies: Patient has no known allergies.    Review of Systems  All other systems reviewed and are negative.   Updated Vital Signs BP (!) 134/91   Pulse 84   Temp 97.9 F (36.6 C) (Oral)   Resp 19   Ht 6' (1.829 m)   Wt 68 kg   SpO2 99%   BMI 20.34 kg/m   Physical Exam Vitals and nursing note reviewed.  Constitutional:      General: He is not in acute distress.    Appearance: Normal appearance. He is well-developed.  HENT:     Head: Normocephalic and atraumatic.  Eyes:     Conjunctiva/sclera: Conjunctivae normal.     Pupils: Pupils are equal, round, and reactive to light.  Cardiovascular:     Rate and Rhythm: Normal rate and regular rhythm.     Heart sounds: Normal heart sounds.  Pulmonary:     Effort: Pulmonary effort is normal. No respiratory distress.  Breath sounds: Normal breath sounds.  Abdominal:     General: There is no distension.     Palpations: Abdomen is soft.     Tenderness: There is abdominal tenderness.     Comments: Tenderness to the epigastrium  Musculoskeletal:        General: No deformity. Normal range of motion.     Cervical back: Normal range of motion and neck supple.  Skin:    General: Skin is warm and dry.  Neurological:     General: No focal deficit present.     Mental Status: He is alert and oriented to person, place, and time.     (all labs ordered are listed, but only abnormal results are displayed) Labs Reviewed  BASIC METABOLIC PANEL WITH GFR - Abnormal;  Notable for the following components:      Result Value   Glucose, Bld 117 (*)    All other components within normal limits  CBC - Abnormal; Notable for the following components:   WBC 12.0 (*)    Hemoglobin 12.5 (*)    RDW 17.7 (*)    Platelets 523 (*)    All other components within normal limits  LIPASE, BLOOD  HEPATIC FUNCTION PANEL  TROPONIN I (HIGH SENSITIVITY)  TROPONIN I (HIGH SENSITIVITY)    EKG: EKG Interpretation Date/Time:  Thursday March 17 2024 15:24:37 EDT Ventricular Rate:  80 PR Interval:  135 QRS Duration:  76 QT Interval:  366 QTC Calculation: 423 R Axis:   69  Text Interpretation: Sinus rhythm Confirmed by Laurice Coy [phone]) on 03/17/2024 6:08:17 PM  Radiology: DG Chest 2 View Result Date: 03/17/2024 CLINICAL DATA:  Chest pain. EXAM: CHEST - 2 VIEW COMPARISON:  06/13/2022 FINDINGS: The cardiomediastinal contours are normal. The lungs are clear. Pulmonary vasculature is normal. No consolidation, pleural effusion, or pneumothorax. Remote left rib fractures. No acute osseous abnormalities are seen. Slight scoliosis. IMPRESSION: No active cardiopulmonary disease. Electronically Signed   By: Andrea Gasman M.D.   On: 03/17/2024 16:22     Procedures   Medications Ordered in the ED  sodium chloride  0.9 % bolus 1,000 mL (has no administration in time range)  ondansetron  (ZOFRAN ) injection 4 mg (has no administration in time range)  morphine  (PF) 4 MG/ML injection 4 mg (has no administration in time range)                                    Medical Decision Making Amount and/or Complexity of Data Reviewed Labs: ordered. Radiology: ordered.  Risk Prescription drug management. Decision regarding hospitalization.    Medical Screen Complete  This patient presented to the ED with complaint of abdominal pain.  This complaint involves an extensive number of treatment options. The initial differential diagnosis includes, but is not limited to,  pancreatitis, metabolic abnormality, etc.  This presentation is: Acute, Chronic, Self-Limited, Previously Undiagnosed, Uncertain Prognosis, Complicated, Systemic Symptoms, and Threat to Life/Bodily Function  Patient with history of pancreatitis.  Patient reports that his pancreatitis was secondary to alcohol intake.  He reports that he has not had any alcohol in the last month.  That being said, patient reports epigastric abdominal pain and nausea and vomiting that began this morning.  Patient's symptoms are consistent with prior episodes of pancreatitis.  Patient's lipase is 52.  Patient's white count is 12.0.  LFTs are within normal limits.  CT imaging demonstrates: IMPRESSION:  1. Acute pancreatitis with  a 4.1 cm pseudocyst in the tail of the  pancreas. The pseudocyst impresses upon the gastric antrum with  possible communicating tract with the stomach lumen.  2. Wall thickening about the gastric fundus and colon in the splenic  flexure, likely reactive secondary to pancreatitis.  3. Severe narrowing of the splenic vein as it passes the pancreatic  tail pseudocyst.   Pseudocyst and possible communicating tract appear to be new since 2018.  Patient with some improvement after IV fluids and antiemetics and pain medication administration.  Patient would benefit from admission.  Hospitalist service made aware of case.  Additional history obtained: External records from outside sources obtained and reviewed including prior ED visits and prior Inpatient records.   Problem List / ED Course:  Pancreatitis  Disposition:  After consideration of the diagnostic results and the patients response to treatment, I feel that the patent would benefit from admission.       Final diagnoses:  Acute pancreatitis, unspecified complication status, unspecified pancreatitis type    ED Discharge Orders     None          Laurice Maude BROCKS, MD 03/17/24 2043

## 2024-03-17 NOTE — ED Triage Notes (Signed)
 Pt reports pain everywhere x several hours PTA. Pt unable to report where pain starts, but reports L shoulder, chest, and abd pain. No N/V/D.

## 2024-03-17 NOTE — Assessment & Plan Note (Addendum)
 General surgery reviewed reviewed imaging for now no surgical indication Reconsult as needed

## 2024-03-18 DIAGNOSIS — K852 Alcohol induced acute pancreatitis without necrosis or infection: Secondary | ICD-10-CM

## 2024-03-18 LAB — LIPID PANEL
Cholesterol: 133 mg/dL (ref 0–200)
HDL: 51 mg/dL (ref >40)
LDL Cholesterol: 70 mg/dL (ref 0–99)
Total CHOL/HDL Ratio: 2.6 ratio
Triglycerides: 61 mg/dL (ref <150)
VLDL: 12 mg/dL (ref 0–40)

## 2024-03-18 LAB — RETICULOCYTES
Immature Retic Fract: 11.7 % (ref 2.3–15.9)
RBC.: 3.88 MIL/uL — ABNORMAL LOW (ref 4.22–5.81)
Retic Count, Absolute: 36.9 K/uL (ref 19.0–186.0)
Retic Ct Pct: 1 % (ref 0.4–3.1)

## 2024-03-18 LAB — VITAMIN B12: Vitamin B-12: 306 pg/mL (ref 180–914)

## 2024-03-18 LAB — PREALBUMIN: Prealbumin: 12 mg/dL — ABNORMAL LOW (ref 18–38)

## 2024-03-18 LAB — COMPREHENSIVE METABOLIC PANEL WITH GFR
ALT: 15 U/L (ref 0–44)
AST: 16 U/L (ref 15–41)
Albumin: 2.7 g/dL — ABNORMAL LOW (ref 3.5–5.0)
Alkaline Phosphatase: 67 U/L (ref 38–126)
Anion gap: 11 (ref 5–15)
BUN: 8 mg/dL (ref 6–20)
CO2: 23 mmol/L (ref 22–32)
Calcium: 8.1 mg/dL — ABNORMAL LOW (ref 8.9–10.3)
Chloride: 102 mmol/L (ref 98–111)
Creatinine, Ser: 0.94 mg/dL (ref 0.61–1.24)
GFR, Estimated: >60 mL/min (ref >60)
Glucose, Bld: 87 mg/dL (ref 70–99)
Potassium: 3.7 mmol/L (ref 3.5–5.1)
Sodium: 136 mmol/L (ref 135–145)
Total Bilirubin: 0.7 mg/dL (ref 0.0–1.2)
Total Protein: 6.1 g/dL — ABNORMAL LOW (ref 6.5–8.1)

## 2024-03-18 LAB — FERRITIN: Ferritin: 22 ng/mL — ABNORMAL LOW (ref 24–336)

## 2024-03-18 LAB — CBC
HCT: 32.7 % — ABNORMAL LOW (ref 39.0–52.0)
Hemoglobin: 10.4 g/dL — ABNORMAL LOW (ref 13.0–17.0)
MCH: 27.1 pg (ref 26.0–34.0)
MCHC: 31.8 g/dL (ref 30.0–36.0)
MCV: 85.2 fL (ref 80.0–100.0)
Platelets: 405 K/uL — ABNORMAL HIGH (ref 150–400)
RBC: 3.84 MIL/uL — ABNORMAL LOW (ref 4.22–5.81)
RDW: 17.6 % — ABNORMAL HIGH (ref 11.5–15.5)
WBC: 9.7 K/uL (ref 4.0–10.5)
nRBC: 0 % (ref 0.0–0.2)

## 2024-03-18 LAB — IRON AND TIBC
Iron: 19 ug/dL — ABNORMAL LOW (ref 45–182)
Saturation Ratios: 5 % — ABNORMAL LOW (ref 17.9–39.5)
TIBC: 360 ug/dL (ref 250–450)
UIBC: 341 ug/dL

## 2024-03-18 LAB — FOLATE: Folate: >40 ng/mL (ref >5.9)

## 2024-03-18 LAB — TRANSFERRIN: Transferrin: 259 mg/dL (ref 180–329)

## 2024-03-18 LAB — MAGNESIUM: Magnesium: 2.3 mg/dL (ref 1.7–2.4)

## 2024-03-18 LAB — PHOSPHORUS: Phosphorus: 3.3 mg/dL (ref 2.5–4.6)

## 2024-03-18 LAB — HIV ANTIBODY (ROUTINE TESTING W REFLEX): HIV Screen 4th Generation wRfx: NONREACTIVE

## 2024-03-18 MED ORDER — ORAL CARE MOUTH RINSE: 15.0000 mL | OROMUCOSAL | Status: DC | PRN

## 2024-03-18 MED ORDER — PNEUMOCOCCAL 20-VAL CONJ VACC 0.5 ML IM SUSY
0.5000 mL | PREFILLED_SYRINGE | INTRAMUSCULAR | Status: DC
  Filled 2024-03-18: qty 0.5

## 2024-03-18 NOTE — Progress Notes (Addendum)
 Pt tolerated advancement of diet.. no N/V/D during the day.

## 2024-03-18 NOTE — Plan of Care (Signed)

## 2024-03-18 NOTE — Progress Notes (Signed)
 PROGRESS NOTE    Antonio Alvarez  FMW:[id number] DOB: 19-Mar-1981 DOA: 03/17/2024 PCP: Vicci Barnie NOVAK, MD   Brief Narrative:  Antonio Alvarez is a 43 y.o. male with medical history significant of chronic pancreatitis, alcohola buse in remission     Presented with  abd pain n/v Hx of alcoholic pancreatitis, here with N/V abd pain Epigastric pain  CT showed acute pancreatitis new pseudocyst lesion with communicating track,   Patient anxious states that he did have a beer today and then 1 more 2 days ago States that he doubts that that is why he ended up having pancreatitis   states he is no longer taking any medications Not endorsing any fevers or chills  Assessment & Plan:   Principal Problem:   Pancreatitis Active Problems:   Paranoid schizophrenia (HCC)   Alcohol abuse   Tobacco abuse   Homelessness   Acute pancreatitis   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  Likely alcohol induced.  CT scan also indicates possible gastritis.  Will start on clears and then advance later to full liquid diet as tolerated.  Continue pain management.  Supportive care.  Continue Protonix .  Pancreatic pseudocyst: Per official report of the radiology, it appears that pseudocyst impresses upon the gastric antrum with possible communicating tract with the stomach lumen, case was discussed by ED physician with general surgery Dr. Sheldon, who apparently reviewed imaging films and was not concerned and did not recommend any surgical intervention at the moment.  Alcohol abuse: He is not forthcoming with his amount of alcohol consumption.  Says that he is in remission but then also says that he had 1 beer yesterday and 1 beer 2 days ago.  Continue CIWA protocol with as needed Ativan .  Normocytic anemia: Hemoglobin was 14.91-year ago and 12.5 yesterday and down to 10.4 today with  normal MCV.  Will check folate, B12 and rest of the iron studies including FOBT.  Repeat labs in the morning.  Tobacco abuse: I have discussed tobacco cessation with the patient.  I have counseled the patient regarding the negative impacts of continued tobacco use including but not limited to lung cancer, COPD, and cardiovascular disease.  I have discussed alternatives to tobacco and modalities that may help facilitate tobacco cessation including but not limited to biofeedback, hypnosis, and medications.  Total time spent with tobacco counseling was 5 minutes.  Genital/penile/scrotal ulcers: Wound care was consulted for those ulcers however patient had not mentioned anything to me about them.  Seen by wound care.  They have provided instructions about how to take care of the wound.  They recommended outpatient follow-up with urology for further investigation.  These are painless.  However I have ordered GC chlamydia, HSV and RPR.  Pictures below.      DVT prophylaxis: SCDs Start: 03/17/24 2305   Code Status: Full Code  Family Communication:  None present at bedside.  Plan of care discussed with patient in length and he/she verbalized understanding and agreed with it.  Status is: Inpatient Remains inpatient appropriate because: Advancing diet slowly.   Estimated body mass index is 21.52 kg/m as calculated from the following:   Height as of this encounter: 5' 10 (1.778 m).   Weight as of this encounter: 68 kg.    Nutritional Assessment: Body mass index is 21.52 kg/m.SABRA Seen by dietician.  I agree with  the assessment and plan as outlined below: Nutrition Status:        . Skin Assessment: I have examined the patient's skin and I agree with the wound assessment as performed by the wound care RN as outlined below:    Consultants:  None  Procedures:  None none  Antimicrobials:  Anti-infectives (From admission, onward)    None         Subjective: Patient seen and  examined, he states that his pain is down to 4 out of 10.  No nausea or other complaint.  He says that he stopped drinking alcohol on June 9 but then this Tuesday and Wednesday, he had 1 beer each day but the beer was large quantity/40 oz.  Objective: Vitals:   03/17/24 2231 03/17/24 2254 03/18/24 0400 03/18/24 0748  BP:  129/82 99/65 103/76  Pulse:  76 68 69  Resp:  15 18 16   Temp: 98.5 F (36.9 C) 98.1 F (36.7 C) 98 F (36.7 C) 98.4 F (36.9 C)  TempSrc: Oral   Oral  SpO2:  98% 97% 100%  Weight:      Height:       No intake or output data in the 24 hours ending 03/18/24 0817 Filed Weights   03/17/24 1528 03/17/24 1854  Weight: 68 kg 68 kg    Examination:  General exam: Appears calm and comfortable  Respiratory system: Clear to auscultation. Respiratory effort normal. Cardiovascular system: S1 & S2 heard, RRR. No JVD, murmurs, rubs, gallops or clicks. No pedal edema. Gastrointestinal system: Abdomen is nondistended, soft and epigastric and periumbilical moderate tenderness. No organomegaly or masses felt. Normal bowel sounds heard. Central nervous system: Alert and oriented. No focal neurological deficits. Extremities: Symmetric 5 x 5 power. Skin: No rashes, lesions or ulcers Psychiatry: Judgement and insight appear normal. Mood & affect appropriate.    Data Reviewed: I have personally reviewed following labs and imaging studies  CBC: Recent Labs  Lab 03/17/24 1640 03/18/24 0533  WBC 12.0* 9.7  HGB 12.5* 10.4*  HCT 39.6 32.7*  MCV 84.3 85.2  PLT 523* 405*   Basic Metabolic Panel: Recent Labs  Lab 03/17/24 1640 03/17/24 2134 03/18/24 0533  NA 139  --  136  K 4.1  --  3.7  CL 101  --  102  CO2 27  --  23  GLUCOSE 117*  --  87  BUN 9  --  8  CREATININE 0.73  --  0.94  CALCIUM 9.0  --  8.1*  MG  --  1.6* 2.3  PHOS  --  4.0 3.3   GFR: Estimated Creatinine Clearance: 96.5 mL/min (by C-G formula based on SCr of 0.94 mg/dL). Liver Function Tests: Recent  Labs  Lab 03/17/24 1640 03/18/24 0533  AST 24 16  ALT 22 15  ALKPHOS 82 67  BILITOT 0.8 0.7  PROT 8.0 6.1*  ALBUMIN 3.7 2.7*   Recent Labs  Lab 03/17/24 1640  LIPASE 52*   No results for input(s): AMMONIA in the last 168 hours. Coagulation Profile: Recent Labs  Lab 03/17/24 2134  INR 1.0   Cardiac Enzymes: Recent Labs  Lab 03/17/24 2134  CKTOTAL 304   BNP (last 3 results) No results for input(s): PROBNP in the last 8760 hours. HbA1C: No results for input(s): HGBA1C in the last 72 hours. CBG: No results for input(s): GLUCAP in the last 168 hours. Lipid Profile: No results for input(s): CHOL, HDL, LDLCALC, TRIG, CHOLHDL, LDLDIRECT in the last 72  hours. Thyroid Function Tests: No results for input(s): TSH, T4TOTAL, FREET4, T3FREE, THYROIDAB in the last 72 hours. Anemia Panel: No results for input(s): VITAMINB12, FOLATE, FERRITIN, TIBC, IRON, RETICCTPCT in the last 72 hours. Sepsis Labs: No results for input(s): PROCALCITON, LATICACIDVEN in the last 168 hours.  No results found for this or any previous visit (from the past 240 hours).   Radiology Studies: CT ABDOMEN PELVIS W CONTRAST Result Date: 03/17/2024 CLINICAL DATA:  Pain everywhere for several hours. Abdominal pain. Left shoulder, chest, EXAM: CT ABDOMEN AND PELVIS WITH CONTRAST TECHNIQUE: Multidetector CT imaging of the abdomen and pelvis was performed using the standard protocol following bolus administration of intravenous contrast. RADIATION DOSE REDUCTION: This exam was performed according to the departmental dose-optimization program which includes automated exposure control, adjustment of the mA and/or kV according to patient size and/or use of iterative reconstruction technique. CONTRAST:  OMNIPAQUE  IOHEXOL  300 MG/ML  SOLN COMPARISON:  07/05/2017 FINDINGS: Lower chest: No acute abnormality. Hepatobiliary: No acute abnormality. Pancreas: Cystic lesion within  the tail of the pancreas measuring 4.1 x 3.7 cm and containing a mural calcification. This likely represents a pseudocyst. The pseudocysts impresses upon the gastric antrum with possible communicating tract with the stomach lumen (circa series 7/image 59 and 2/51). Mural calcification within the cyst. No ductal dilation. Ductal calcification in the tail of the pancreas. There is an additional smaller cyst in the tail the pancreas measuring 1.4 cm (series 7/image 73). Free fluid about the gastric antrum, pancreatic tail, colon at the splenic flexure, and spleen in the left upper quadrant. Spleen: Unremarkable. Adrenals/Urinary Tract: Normal adrenal glands. No urinary calculi or hydronephrosis. Similar diffuse wall thickening of the bladder. Stomach/Bowel: Wall thickening about the gastric fundus. Wall thickening about the colon in the splenic flexure. These may be reactive secondary to pancreatitis. No bowel obstruction. Normal appendix. Vascular/Lymphatic: Portal vein is patent. There is severe narrowing of the splenic vein as it passes the pancreatic tail pseudocyst (series 7/image 70). Reproductive: No acute abnormality. Other: No organized fluid collection.  No free intraperitoneal air. Musculoskeletal: No acute fracture. IMPRESSION: 1. Acute pancreatitis with a 4.1 cm pseudocyst in the tail of the pancreas. The pseudocyst impresses upon the gastric antrum with possible communicating tract with the stomach lumen. 2. Wall thickening about the gastric fundus and colon in the splenic flexure, likely reactive secondary to pancreatitis. 3. Severe narrowing of the splenic vein as it passes the pancreatic tail pseudocyst. Electronically Signed   By: Norman Gatlin M.D.   On: 03/17/2024 19:58   DG Chest 2 View Result Date: 03/17/2024 CLINICAL DATA:  Chest pain. EXAM: CHEST - 2 VIEW COMPARISON:  06/13/2022 FINDINGS: The cardiomediastinal contours are normal. The lungs are clear. Pulmonary vasculature is normal. No  consolidation, pleural effusion, or pneumothorax. Remote left rib fractures. No acute osseous abnormalities are seen. Slight scoliosis. IMPRESSION: No active cardiopulmonary disease. Electronically Signed   By: Andrea Gasman M.D.   On: 03/17/2024 16:22    Scheduled Meds:  folic acid   1 mg Oral Daily   multivitamin with minerals  1 tablet Oral Daily   nicotine   14 mg Transdermal Daily   pantoprazole  (PROTONIX ) IV  40 mg Intravenous QHS   [START ON 03/19/2024] pneumococcal 20-valent conjugate vaccine  0.5 mL Intramuscular Tomorrow-1000   tamsulosin   0.4 mg Oral Daily   thiamine   100 mg Oral Daily   Or   thiamine   100 mg Intravenous Daily   Continuous Infusions:  sodium chloride   sodium chloride  125 mL/hr at 03/17/24 2307     LOS: 1 day   Fredia Skeeter, MD [facility]  03/18/2024, 8:17 AM   *Please note that this is a verbal dictation therefore any spelling or grammatical errors are due to the Dragon Medical One system interpretation.  Please page via Amion and do not message via secure chat for urgent patient care matters. Secure chat can be used for non urgent patient care matters.  How to contact the [facility] Attending or Consulting provider 7A - 7P or covering provider during after hours 7P -7A, for this patient?  Check the care team in [facility] and look for a) attending/consulting [facility] provider listed and b) the [facility] team listed. Page or secure chat 7A-7P. Log into [email or web address] and use [place]'s universal password to access. If you do not have the password, please contact the hospital operator. Locate the [facility] provider you are looking for under [facility] and page to a number that you can be directly reached. If you still have difficulty reaching the provider, please page the [facility] (Director on Call) for the Hospitalists listed on amion for assistance.

## 2024-03-18 NOTE — Consult Note (Addendum)
 WOC Nurse Consult Note: Reason for Consult:ulcers on penis and scrotum  Wound type: full thickness to penile shaft and base of penis/scrotum  4 small full thickness areas vs lesions  Pressure Injury POA: NA not pressure  Measurement: see nursing flowsheet  Wound azi:dyjqu wound appears red moist; base of penis onto scrotum 50% pink 50% tan  Drainage (amount, consistency, odor) see nursing flowsheet  Periwound: Dressing procedure/placement/frequency: Cleanse wounds/lesions to penis and scrotum with NS, wrap a piece of single layer Xeroform gauze Soila [phone]) around penis and apply a small piece of Xeroform to ulcerations on scrotum.  Difficult area to hold dressing, can cover with ABD pad and attempt to hold in place with mesh underwear.   These wounds/lesions require further evaluation and workup to determine etiology.  Will send secure chat to primary MD regarding same.   POC discussed with bedside nurse.  Appreciate M. Sesser, Rn assistance with this consult. WOC team will not follow.  Re-consult if further needs arise.    Thank you,    Powell Bar MSN, RN-BC, [employer]

## 2024-03-18 NOTE — Plan of Care (Signed)
 Pt. Admitted this shift. Pain is well-controlled under current regimen. Problem: Education: Goal: Knowledge of General Education information will improve Description: Including pain rating scale, medication(s)/side effects and non-pharmacologic comfort measures Outcome: Progressing   Problem: Health Behavior/Discharge Planning: Goal: Ability to manage health-related needs will improve Outcome: Progressing   Problem: Clinical Measurements: Goal: Ability to maintain clinical measurements within normal limits will improve Outcome: Progressing Goal: Will remain free from infection Outcome: Progressing Goal: Diagnostic test results will improve Outcome: Progressing Goal: Respiratory complications will improve Outcome: Progressing Goal: Cardiovascular complication will be avoided Outcome: Progressing   Problem: Activity: Goal: Risk for activity intolerance will decrease Outcome: Progressing   Problem: Nutrition: Goal: Adequate nutrition will be maintained Outcome: Progressing   Problem: Coping: Goal: Level of anxiety will decrease Outcome: Progressing   Problem: Elimination: Goal: Will not experience complications related to bowel motility Outcome: Progressing Goal: Will not experience complications related to urinary retention Outcome: Progressing   Problem: Pain Managment: Goal: General experience of comfort will improve and/or be controlled Outcome: Progressing   Problem: Safety: Goal: Ability to remain free from injury will improve Outcome: Progressing   Problem: Skin Integrity: Goal: Risk for impaired skin integrity will decrease Outcome: Progressing

## 2024-03-18 NOTE — Progress Notes (Signed)
 Pt denies of assessment of genital area/ and also testing. RN made MD aware.

## 2024-03-18 NOTE — Progress Notes (Signed)
 SATURATION QUALIFICATIONS: (This note is used to comply with regulatory documentation for home oxygen)  Patient Saturations on Room Air at Rest = 99%  Patient Saturations on Room Air while Ambulating = 95%  Pt does not meed needs for oxygen use while ambulating

## 2024-03-19 LAB — COMPREHENSIVE METABOLIC PANEL WITH GFR
ALT: 15 U/L (ref 0–44)
AST: 19 U/L (ref 15–41)
Albumin: 2.8 g/dL — ABNORMAL LOW (ref 3.5–5.0)
Alkaline Phosphatase: 63 U/L (ref 38–126)
Anion gap: 9 (ref 5–15)
BUN: 5 mg/dL — ABNORMAL LOW (ref 6–20)
CO2: 27 mmol/L (ref 22–32)
Calcium: 8.5 mg/dL — ABNORMAL LOW (ref 8.9–10.3)
Chloride: 103 mmol/L (ref 98–111)
Creatinine, Ser: 0.74 mg/dL (ref 0.61–1.24)
GFR, Estimated: >60 mL/min
Glucose, Bld: 97 mg/dL (ref 70–99)
Potassium: 3.8 mmol/L (ref 3.5–5.1)
Sodium: 139 mmol/L (ref 135–145)
Total Bilirubin: 0.5 mg/dL (ref 0.0–1.2)
Total Protein: 6.5 g/dL (ref 6.5–8.1)

## 2024-03-19 LAB — CBC WITH DIFFERENTIAL/PLATELET
Abs Immature Granulocytes: 0.01 10*3/uL (ref 0.00–0.07)
Basophils Absolute: 0 10*3/uL (ref 0.0–0.1)
Basophils Relative: 1 %
Eosinophils Absolute: 0.2 10*3/uL (ref 0.0–0.5)
Eosinophils Relative: 4 %
HCT: 32.1 % — ABNORMAL LOW (ref 39.0–52.0)
Hemoglobin: 10.1 g/dL — ABNORMAL LOW (ref 13.0–17.0)
Immature Granulocytes: 0 %
Lymphocytes Relative: 31 %
Lymphs Abs: 1.9 10*3/uL (ref 0.7–4.0)
MCH: 26.6 pg (ref 26.0–34.0)
MCHC: 31.5 g/dL (ref 30.0–36.0)
MCV: 84.5 fL (ref 80.0–100.0)
Monocytes Absolute: 0.6 10*3/uL (ref 0.1–1.0)
Monocytes Relative: 11 %
Neutro Abs: 3.2 10*3/uL (ref 1.7–7.7)
Neutrophils Relative %: 53 %
Platelets: 398 10*3/uL (ref 150–400)
RBC: 3.8 MIL/uL — ABNORMAL LOW (ref 4.22–5.81)
RDW: 17.5 % — ABNORMAL HIGH (ref 11.5–15.5)
WBC: 6 10*3/uL (ref 4.0–10.5)
nRBC: 0 % (ref 0.0–0.2)

## 2024-03-19 LAB — RPR
RPR Ser Ql: REACTIVE — AB
RPR Titer: 1:32 {titer}

## 2024-03-19 MED ORDER — OMEPRAZOLE 40 MG PO CPDR: 40.0000 mg | DELAYED_RELEASE_CAPSULE | Freq: Two times a day (BID) | ORAL | 0 refills | Status: DC

## 2024-03-19 MED ORDER — FERROUS SULFATE 325 (65 FE) MG PO TABS: 325.0000 mg | ORAL_TABLET | Freq: Every day | ORAL | 0 refills | Status: DC

## 2024-03-19 NOTE — Plan of Care (Signed)
  Problem: Education: Goal: Knowledge of General Education information will improve Description: Including pain rating scale, medication(s)/side effects and non-pharmacologic comfort measures Outcome: Progressing   Problem: Clinical Measurements: Goal: Ability to maintain clinical measurements within normal limits will improve Outcome: Progressing Goal: Will remain free from infection Outcome: Progressing Goal: Diagnostic test results will improve Outcome: Progressing   Problem: Nutrition: Goal: Adequate nutrition will be maintained Outcome: Progressing   Problem: Pain Managment: Goal: General experience of comfort will improve and/or be controlled Outcome: Progressing

## 2024-03-19 NOTE — Discharge Summary (Addendum)
 Physician Discharge Summary  Antonio Alvarez FMW:[id number] DOB: 1981-04-01 DOA: 03/17/2024  PCP: Antonio Barnie NOVAK, MD  Admit date: 03/17/2024 Discharge date: 03/19/2024 30 Day Unplanned Readmission Risk Score    Flowsheet Row ED to Hosp-Admission (Discharged) from 03/17/2024 in [facility]  30 Day Unplanned Readmission Risk Score (%) 12.32 Filed at 03/19/2024 0800    This score is the patient's risk of an unplanned readmission within 30 days of being discharged (0 -100%). The score is based on dignosis, age, lab data, medications, orders, and past utilization.   Low:  0-14.9   Medium: 15-21.9   High: 22-29.9   Extreme: 30 and above          Admitted From: Home Disposition: Home  Recommendations for Outpatient Follow-up:  Follow up with PCP in 1-2 weeks Please obtain BMP/CBC in one week Follow-up with urology in 1 to 2 weeks for further evaluation of genital ulcers Please follow up with your PCP on the following pending results: Unresulted Labs (From admission, onward)     Start     Ordered   03/18/24 0948  RPR  Add-on,   AD        03/18/24 9047   03/18/24 0948  HSV(herpes simplex vrs) 1+2 ab-IgG  Add-on,   AD        03/18/24 9047   03/18/24 0821  Occult blood card to lab, stool  Once,   R        03/18/24 0820              Home Health: None Equipment/Devices: None  Discharge Condition: Stable CODE STATUS: Full code Diet recommendation: Cardiac  Subjective: Seen and examined, he has no complaints.  Tolerated soft diet last night and this morning.  No abdominal pain or nausea.  He did not bring up any thing about his genital ulcer again.  I had lengthy discussion with him, details below.  Brief/Interim Summary: Antonio Alvarez is a 43 y.o. male with medical history significant of chronic pancreatitis, alcohola buse presented with abdominal pain, nausea, was diagnosed with pancreatitis based on the CT scan.  No fever, chest pain shortness  of breath or any other complaint.  Details of the hospitalization as below.   Acute alcoholic pancreatitis/gastritis: Likely alcohol induced as patient states that he is in remission but also endorsed consuming 1 large beer a 40 ounce each day on Tuesday and Wednesday.  CT scan also indicates possible gastritis.  Was managed conservatively with n.p.o. initially, started on liquid diet yesterday which advance to soft diet per his request, tolerated dinner and breakfast this morning.  Acute pancreatitis has resolved.  Discharging home today on 28 days of omeprazole  for gastritis.   Pancreatic pseudocyst: Per official report of the radiology, it appears that pseudocyst impresses upon the gastric antrum with possible communicating tract with the stomach lumen, case was discussed by ED physician with general surgery Dr. Sheldon, who apparently reviewed imaging films and was not concerned and did not recommend any surgical intervention at the moment.   Alcohol abuse: He is not forthcoming with his amount of alcohol consumption.  Says that he is in remission but then also says that he had 1 beer Tuesday and Wednesday this week.  He was started on CIWA protocol but did not have any withdrawal symptoms.   Normocytic anemia: Hemoglobin was 14.91-year ago and 12.5 yesterday and down to 10. 1 today.  Workup indicates iron deficiency anemia.  Discharging on iron supplements.   Tobacco abuse: I have discussed tobacco cessation with the patient.  I have counseled the patient regarding the negative impacts of continued tobacco use including but not limited to lung cancer, COPD, and cardiovascular disease.  I have discussed alternatives to tobacco and modalities that may help facilitate tobacco cessation including but not limited to biofeedback, hypnosis, and medications.  Total time spent with tobacco counseling was 5 minutes.   Genital/penile/scrotal ulcers: Wound care was consulted for those ulcers however patient had  not mentioned anything to me about them yesterday or today.  Seen by wound care.  They have provided instructions about how to take care of the wound.  They recommended outpatient follow-up with urology for further investigation.  However patient refused wound care with the nurse yesterday.  I had a lengthy discussion with the patient today and requested him to allow nurses to do the wound care and to learn how to do wound care at home.  He responded that  I think they just need the air dog, I think they will heal by itself.  I informed him that unfortunately they do not appear to be simple ulcer or skin cut and needs further evaluation.  I had ordered some labs for him including HSV, RPR as well as GC chlamydia.  HSV and RPR are in process but looks like GC chlamydia was never obtained.  He is not interested in seeking further care but I strongly advised him to follow-up with [facility] for further workup.  Addendum 03/19/2024 at 10:58 AM: Patient had left the hospital almost 2 hours ago, I just received the result of his RPR which is positive.  I tried calling patient at his phone number provided in the chart but no answer and could not leave a voicemail.  I have forwarded the results to patient's PCP Dr. Barnie Louder and requested her to evaluate him further for that and provide treatment.  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    Discharge Instructions   Allergies as of 03/19/2024   No Known Allergies      Medication List     STOP taking these medications    acetaminophen  500 MG tablet Commonly known as: TYLENOL    doxycycline  100 MG capsule Commonly known as: VIBRAMYCIN    HYDROcodone -acetaminophen  5-325 MG tablet Commonly known as: NORCO/VICODIN   ibuprofen  600 MG tablet Commonly  known as: ADVIL    multivitamin with minerals Tabs tablet   pantoprazole  20 MG tablet Commonly known as: PROTONIX    senna-docusate 8.6-50 MG tablet Commonly known as: Senokot-S       TAKE these medications    ferrous sulfate  325 (65 FE) MG tablet Take 1 tablet (325 mg total) by mouth daily with breakfast.   folic acid  1 MG tablet Commonly known as: FOLVITE  Take 1 tablet (1 mg total) by mouth daily.   omeprazole  40 MG capsule Commonly known as: PRILOSEC Take 1 capsule (40 mg total) by mouth 2 (two) times daily for 28 days.   tamsulosin  0.4 MG Caps capsule Commonly known as: FLOMAX  Take 1 capsule (0.4 mg total) by mouth daily.   thiamine  100 MG tablet Commonly known as: VITAMIN B1 Take 1 tablet (100 mg total) by mouth daily.        Follow-up Information  Antonio Barnie NOVAK, MD Follow up in 1 week(s).   Specialty: Internal Medicine Contact information: [address] [place] [address] [place] [address] [phone]         [facility] Follow up in 1 week(s).   Contact information: [address] [place] [address] [place]  [address] [phone]               No Known Allergies  Consultations: None   Procedures/Studies: CT ABDOMEN PELVIS W CONTRAST Result Date: 03/17/2024 CLINICAL DATA:  Pain everywhere for several hours. Abdominal pain. Left shoulder, chest, EXAM: CT ABDOMEN AND PELVIS WITH CONTRAST TECHNIQUE: Multidetector CT imaging of the abdomen and pelvis was performed using the standard protocol following bolus administration of intravenous contrast. RADIATION DOSE REDUCTION: This exam was performed according to the departmental dose-optimization program which includes automated exposure control, adjustment of the mA and/or kV according to patient size and/or use of iterative reconstruction technique. CONTRAST:  OMNIPAQUE  IOHEXOL  300 MG/ML  SOLN COMPARISON:  07/05/2017 FINDINGS: Lower chest: No acute abnormality.  Hepatobiliary: No acute abnormality. Pancreas: Cystic lesion within the tail of the pancreas measuring 4.1 x 3.7 cm and containing a mural calcification. This likely represents a pseudocyst. The pseudocysts impresses upon the gastric antrum with possible communicating tract with the stomach lumen (circa series 7/image 59 and 2/51). Mural calcification within the cyst. No ductal dilation. Ductal calcification in the tail of the pancreas. There is an additional smaller cyst in the tail the pancreas measuring 1.4 cm (series 7/image 73). Free fluid about the gastric antrum, pancreatic tail, colon at the splenic flexure, and spleen in the left upper quadrant. Spleen: Unremarkable. Adrenals/Urinary Tract: Normal adrenal glands. No urinary calculi or hydronephrosis. Similar diffuse wall thickening of the bladder. Stomach/Bowel: Wall thickening about the gastric fundus. Wall thickening about the colon in the splenic flexure. These may be reactive secondary to pancreatitis. No bowel obstruction. Normal appendix. Vascular/Lymphatic: Portal vein is patent. There is severe narrowing of the splenic vein as it passes the pancreatic tail pseudocyst (series 7/image 70). Reproductive: No acute abnormality. Other: No organized fluid collection.  No free intraperitoneal air. Musculoskeletal: No acute fracture. IMPRESSION: 1. Acute pancreatitis with a 4.1 cm pseudocyst in the tail of the pancreas. The pseudocyst impresses upon the gastric antrum with possible communicating tract with the stomach lumen. 2. Wall thickening about the gastric fundus and colon in the splenic flexure, likely reactive secondary to pancreatitis. 3. Severe narrowing of the splenic vein as it passes the pancreatic tail pseudocyst. Electronically Signed   By: Norman Gatlin M.D.   On: 03/17/2024 19:58   DG Chest 2 View Result Date: 03/17/2024 CLINICAL DATA:  Chest pain. EXAM: CHEST - 2 VIEW COMPARISON:  06/13/2022 FINDINGS: The cardiomediastinal contours are  normal. The lungs are clear. Pulmonary vasculature is normal. No consolidation, pleural effusion, or pneumothorax. Remote left rib fractures. No acute osseous abnormalities are seen. Slight scoliosis. IMPRESSION: No active cardiopulmonary disease. Electronically Signed   By: Andrea Gasman M.D.   On: 03/17/2024 16:22     Discharge Exam: Vitals:   03/18/24 1951 03/19/24 0526  BP: 114/75 126/79  Pulse: 76 (!) 56  Resp: 18 19  Temp: 98.2 F (36.8 C) 97.9 F (36.6 C)  SpO2: 99% 99%   Vitals:   03/18/24 0748 03/18/24 1126 03/18/24 1951 03/19/24 0526  BP: 103/76 93/61 114/75 126/79  Pulse: 69 (!) 59 76 (!) 56  Resp: 16 18 18 19   Temp: 98.4 F (36.9  C) 98.2 F (36.8 C) 98.2 F (36.8 C) 97.9 F (36.6 C)  TempSrc: Oral Oral    SpO2: 100% 100% 99% 99%  Weight:      Height:        General: Pt is alert, awake, not in acute distress Cardiovascular: RRR, S1/S2 +, no rubs, no gallops Respiratory: CTA bilaterally, no wheezing, no rhonchi Abdominal: Soft, NT, ND, bowel sounds + Extremities: no edema, no cyanosis    The results of significant diagnostics from this hospitalization (including imaging, microbiology, ancillary and laboratory) are listed below for reference.     Microbiology: No results found for this or any previous visit (from the past 240 hours).   Labs: BNP (last 3 results) No results for input(s): BNP in the last 8760 hours. Basic Metabolic Panel: Recent Labs  Lab 03/17/24 1640 03/17/24 2134 03/18/24 0533 03/19/24 0539  NA 139  --  136 139  K 4.1  --  3.7 3.8  CL 101  --  102 103  CO2 27  --  23 27  GLUCOSE 117*  --  87 97  BUN 9  --  8 5*  CREATININE 0.73  --  0.94 0.74  CALCIUM 9.0  --  8.1* 8.5*  MG  --  1.6* 2.3  --   PHOS  --  4.0 3.3  --    Liver Function Tests: Recent Labs  Lab 03/17/24 1640 03/18/24 0533 03/19/24 0539  AST 24 16 19   ALT 22 15 15   ALKPHOS 82 67 63  BILITOT 0.8 0.7 0.5  PROT 8.0 6.1* 6.5  ALBUMIN 3.7 2.7* 2.8*    Recent Labs  Lab 03/17/24 1640  LIPASE 52*   No results for input(s): AMMONIA in the last 168 hours. CBC: Recent Labs  Lab 03/17/24 1640 03/18/24 0533 03/19/24 0539  WBC 12.0* 9.7 6.0  NEUTROABS  --   --  3.2  HGB 12.5* 10.4* 10.1*  HCT 39.6 32.7* 32.1*  MCV 84.3 85.2 84.5  PLT 523* 405* 398   Cardiac Enzymes: Recent Labs  Lab 03/17/24 2134  CKTOTAL 304   BNP: Invalid input(s): POCBNP CBG: No results for input(s): GLUCAP in the last 168 hours. D-Dimer No results for input(s): DDIMER in the last 72 hours. Hgb A1c No results for input(s): HGBA1C in the last 72 hours. Lipid Profile Recent Labs    03/18/24 0533  CHOL 133  HDL 51  LDLCALC 70  TRIG 61  CHOLHDL 2.6   Thyroid function studies No results for input(s): TSH, T4TOTAL, T3FREE, THYROIDAB in the last 72 hours.  Invalid input(s): FREET3 Anemia work up Recent Labs    03/18/24 0917  VITAMINB12 306  FOLATE >40.0  FERRITIN 22*  TIBC 360  IRON 19*  RETICCTPCT 1.0   Urinalysis    Component Value Date/Time   COLORURINE YELLOW 06/12/2022 2046   APPEARANCEUR CLEAR 06/12/2022 2046   LABSPEC 1.010 06/12/2022 2046   PHURINE 5.0 06/12/2022 2046   GLUCOSEU NEGATIVE 06/12/2022 2046   HGBUR SMALL (A) 06/12/2022 2046   BILIRUBINUR NEGATIVE 06/12/2022 2046   KETONESUR NEGATIVE 06/12/2022 2046   PROTEINUR NEGATIVE 06/12/2022 2046   UROBILINOGEN 0.2 04/27/2015 0734   NITRITE NEGATIVE 06/12/2022 2046   LEUKOCYTESUR NEGATIVE 06/12/2022 2046   Sepsis Labs Recent Labs  Lab 03/17/24 1640 03/18/24 0533 03/19/24 0539  WBC 12.0* 9.7 6.0   Microbiology No results found for this or any previous visit (from the past 240 hours).  FURTHER DISCHARGE INSTRUCTIONS:   Get Medicines  reviewed and adjusted: Please take all your medications with you for your next visit with your Primary MD   Laboratory/radiological data: Please request your Primary MD to go over all hospital tests and  procedure/radiological results at the follow up, please ask your Primary MD to get all Hospital records sent to his/her office.   In some cases, they will be blood work, cultures and biopsy results pending at the time of your discharge. Please request that your primary care M.D. goes through all the records of your hospital data and follows up on these results.   Also Note the following: If you experience worsening of your admission symptoms, develop shortness of breath, life threatening emergency, suicidal or homicidal thoughts you must seek medical attention immediately by calling 911 or calling your MD immediately  if symptoms less severe.   You must read complete instructions/literature along with all the possible adverse reactions/side effects for all the Medicines you take and that have been prescribed to you. Take any new Medicines after you have completely understood and accpet all the possible adverse reactions/side effects.    patient was instructed, not to drive, operate heavy machinery, perform activities at heights, swimming or participation in water activities or provide baby-sitting services while on Pain, Sleep and Anxiety Medications; until their outpatient Physician has advised to do so again. Also recommended to not to take more than prescribed Pain, Sleep and Anxiety Medications.  It is not advisable to combine anxiety, sleep and pain medications without talking with your primary care provider.     Wear Seat belts while driving.   Please note: You were cared for by a hospitalist during your hospital stay. Once you are discharged, your primary care physician will handle any further medical issues. Please note that NO REFILLS for any discharge medications will be authorized once you are discharged, as it is imperative that you return to your primary care physician (or establish a relationship with a primary care physician if you do not have one) for your post hospital discharge needs  so that they can reassess your need for medications and monitor your lab values  Time coordinating discharge: Over 30 minutes  SIGNED:   Fredia Skeeter, MD  [facility] 03/19/2024, 10:25 AM *Please note that this is a verbal dictation therefore any spelling or grammatical errors are due to the Dragon Medical One system interpretation. If 7PM-7AM, please contact night-coverage [email or web address]

## 2024-03-19 NOTE — Plan of Care (Signed)

## 2024-03-20 LAB — HSV(HERPES SIMPLEX VRS) I + II AB-IGG
HSV 1 Glycoprotein G Ab, IgG: NONREACTIVE
HSV 2 Glycoprotein G Ab, IgG: REACTIVE — AB

## 2024-03-21 ENCOUNTER — Ambulatory Visit: Payer: Self-pay | Admitting: Internal Medicine

## 2024-03-21 ENCOUNTER — Telehealth: Payer: Self-pay

## 2024-03-21 NOTE — Transitions of Care (Post Inpatient/ED Visit) (Signed)
   03/21/2024  Name: Antonio Alvarez MRN: [id number] DOB: August 03, 1981  Today's TOC FU Call Status: Today's TOC FU Call Status:: Unsuccessful Call (1st Attempt) Unsuccessful Call (1st Attempt) Date: 03/21/24  Attempted to reach the patient regarding the most recent Inpatient/ED visit.  Follow Up Plan: Additional outreach attempts will be made to reach the patient to complete the Transitions of Care (Post Inpatient/ED visit) call.   I also called [phone] which was listed as a work phone number and the person who answered said he no longer works there.    Dr Vicci is listed as PCP but has not seen the patient in 7 years.   Signature Slater Diesel, RN

## 2024-03-22 ENCOUNTER — Telehealth: Payer: Self-pay

## 2024-03-22 LAB — T.PALLIDUM AB, TOTAL: T Pallidum Abs: REACTIVE — AB

## 2024-03-22 NOTE — Transitions of Care (Post Inpatient/ED Visit) (Signed)
   03/22/2024  Name: Antonio Alvarez MRN: [id number] DOB: 21-Jan-1981  Today's TOC FU Call Status: Today's TOC FU Call Status:: Unsuccessful Call (2nd Attempt) Unsuccessful Call (1st Attempt) Date: 03/21/24 Unsuccessful Call (2nd Attempt) Date: 03/22/24  Attempted to reach the patient regarding the most recent Inpatient/ED visit.  Follow Up Plan: Additional outreach attempts will be made to reach the patient to complete the Transitions of Care (Post Inpatient/ED visit) call.   Signature  Slater Diesel, RN

## 2024-03-23 ENCOUNTER — Telehealth: Payer: Self-pay

## 2024-03-23 NOTE — Telephone Encounter (Signed)
 If unable to reach him, please notify [facility] so they can help track him down and send a letter requesting that he calls to go over lab results. Thanks.

## 2024-03-23 NOTE — Telephone Encounter (Signed)
-----   Message from [place] sent at 03/19/2024 10:56 AM EDT ----- Regarding: RPR positive Hi Dr. Vicci, please review my discharge summary on this patient.  Patient had been discharged but I just received the results of RPR which is positive.  He has genital ulcers.  He was not willing  to seek care for that.  Letting you know so you can call him and further evaluate and provide treatment for this. ----- Message ----- From: Interface, Lab In [place] Sent: 03/19/2024  10:29 AM EDT To: Fredia Skeeter, MD

## 2024-03-23 NOTE — Transitions of Care (Post Inpatient/ED Visit) (Signed)
   03/23/2024  Name: Antonio Alvarez MRN: [id number] DOB: 05-Jul-1981  Today's TOC FU Call Status: Today's TOC FU Call Status:: Unsuccessful Call (3rd Attempt) Unsuccessful Call (1st Attempt) Date: 03/21/24 Unsuccessful Call (2nd Attempt) Date: 03/22/24 Unsuccessful Call (3rd Attempt) Date: 03/23/24  Attempted to reach the patient regarding the most recent Inpatient/ED visit.  Follow Up Plan: No further outreach attempts will be made at this time. We have been unable to contact the patient.  Dr Vicci is listed as his PCP but he has not seen her since 01/2017   Letter sent to patient requesting he contact this clinic to schedule a follow up appointment as we have not been able to reach him.   Signature  Slater Diesel, RN

## 2024-03-26 ENCOUNTER — Encounter (HOSPITAL_COMMUNITY): Payer: Self-pay | Admitting: Emergency Medicine

## 2024-03-26 ENCOUNTER — Emergency Department (HOSPITAL_COMMUNITY)
Admission: EM | Admit: 2024-03-26 | Discharge: 2024-03-26 | Disposition: A | Discharge status: Home / Self Care | Payer: Self-pay | Attending: Emergency Medicine | Admitting: Emergency Medicine

## 2024-03-26 ENCOUNTER — Other Ambulatory Visit: Payer: Self-pay

## 2024-03-26 DIAGNOSIS — N4889 Other specified disorders of penis: Secondary | ICD-10-CM | POA: Insufficient documentation

## 2024-03-26 DIAGNOSIS — L02415 Cutaneous abscess of right lower limb: Secondary | ICD-10-CM | POA: Insufficient documentation

## 2024-03-26 DIAGNOSIS — L02213 Cutaneous abscess of chest wall: Secondary | ICD-10-CM | POA: Insufficient documentation

## 2024-03-26 DIAGNOSIS — N489 Disorder of penis, unspecified: Secondary | ICD-10-CM

## 2024-03-26 DIAGNOSIS — Z23 Encounter for immunization: Secondary | ICD-10-CM | POA: Insufficient documentation

## 2024-03-26 DIAGNOSIS — A539 Syphilis, unspecified: Secondary | ICD-10-CM | POA: Insufficient documentation

## 2024-03-26 MED ORDER — TETANUS-DIPHTH-ACELL PERTUSSIS 5-2.5-18.5 LF-MCG/0.5 IM SUSY
0.5000 mL | PREFILLED_SYRINGE | Freq: Once | INTRAMUSCULAR | Status: AC
  Administered 2024-03-26: 0.5 mL via INTRAMUSCULAR
  Filled 2024-03-26: qty 0.5

## 2024-03-26 MED ORDER — LIDOCAINE-EPINEPHRINE (PF) 2 %-1:200000 IJ SOLN
10.0000 mL | Freq: Once | INTRAMUSCULAR | Status: AC
  Administered 2024-03-26: 10 mL
  Filled 2024-03-26: qty 20

## 2024-03-26 MED ORDER — DOXYCYCLINE HYCLATE 100 MG PO CAPS: 100.0000 mg | ORAL_CAPSULE | Freq: Two times a day (BID) | ORAL | 0 refills | Status: DC

## 2024-03-26 MED ORDER — PENICILLIN G BENZATHINE 1200000 UNIT/2ML IM SUSY
2.4000 10*6.[IU] | PREFILLED_SYRINGE | Freq: Once | INTRAMUSCULAR | Status: AC
Start: 2024-03-26 — End: 2024-03-26
  Administered 2024-03-26: 2.4 10*6.[IU] via INTRAMUSCULAR
  Filled 2024-03-26: qty 4

## 2024-03-26 NOTE — ED Provider Notes (Signed)
 [place] EMERGENCY DEPARTMENT AT [facility] Provider Note   CSN: [id number] Arrival date & time: 03/26/24  0044     Patient presents with: Abscess   Antonio Alvarez is a 43 y.o. male.   43 year old male with complaint of abscess to right lateral chest wall and thigh as well as lesions to his penis. Patient was recently admitted for pancreatitis, mentioned his penile lesions while admitted, RPR was positive, urine testing was ordered but not completed for gonorrhea/chlamydia. Patient was not aware he was positive for syphilis. HSV IgG +. Following dc, patient was advised to follow up with PCP and urology however he does not have a PCP and has not sought urology follow up. States lesions on penis dressed by himself 3 days ago and has not changed the dressing. Denies any pain associated with the lesions.        Prior to Admission medications   Medication Sig Start Date End Date Taking? Authorizing Provider  doxycycline  (VIBRAMYCIN ) 100 MG capsule Take 1 capsule (100 mg total) by mouth 2 (two) times daily. 03/26/24  Yes Beverley Leita LABOR, PA-C  ferrous sulfate  325 (65 FE) MG tablet Take 1 tablet (325 mg total) by mouth daily with breakfast. 03/19/24 06/17/24  Vernon Ranks, MD  folic acid  (FOLVITE ) 1 MG tablet Take 1 tablet (1 mg total) by mouth daily. Patient not taking: Reported on 03/17/2024 07/08/17   Jerri Keys, MD  omeprazole  (PRILOSEC) 40 MG capsule Take 1 capsule (40 mg total) by mouth 2 (two) times daily for 28 days. 03/19/24 04/16/24  Vernon Ranks, MD  tamsulosin  (FLOMAX ) 0.4 MG CAPS capsule Take 1 capsule (0.4 mg total) by mouth daily. Patient not taking: Reported on 03/17/2024 07/08/17   Jerri Keys, MD  thiamine  100 MG tablet Take 1 tablet (100 mg total) by mouth daily. Patient not taking: Reported on 03/17/2024 07/08/17   Jerri Keys, MD    Allergies: Patient has no known allergies.    Review of Systems Negative except as per HPI Updated Vital Signs BP 120/83   Pulse 86   Resp  17   Wt 68 kg   SpO2 100%   BMI 21.52 kg/m   Physical Exam Vitals and nursing note reviewed.  Constitutional:      General: He is not in acute distress.    Appearance: He is well-developed. He is not diaphoretic.  HENT:     Head: Normocephalic and atraumatic.  Pulmonary:     Effort: Pulmonary effort is normal.  Skin:    General: Skin is warm and dry.     Comments: Right chest wall lesion appears more consistent with keloid however does have some erythema and fluctuance, no drainage.   Right lateral thigh area with fluctuance and erythema, no drainage   Multiple shallow painless ulcers to body of penis, glans, scrotum   Neurological:     Mental Status: He is alert and oriented to person, place, and time.  Psychiatric:        Behavior: Behavior normal.        (all labs ordered are listed, but only abnormal results are displayed) Labs Reviewed  GC/CHLAMYDIA PROBE AMP ([place]) NOT AT [facility]    EKG: None  Radiology: No results found.   .Incision and Drainage  Date/Time: 03/26/2024 2:25 AM  Performed by: Beverley Leita LABOR, PA-C Authorized by: Beverley Leita LABOR, PA-C   Consent:    Consent obtained:  Verbal   Consent given by:  Patient  Risks discussed:  Bleeding, incomplete drainage, pain and damage to other organs   Alternatives discussed:  No treatment Universal protocol:    Procedure explained and questions answered to patient or proxy's satisfaction: yes     Relevant documents present and verified: yes     Test results available : yes     Imaging studies available: yes     Required blood products, implants, devices, and special equipment available: yes     Site/side marked: yes     Immediately prior to procedure, a time out was called: yes     Patient identity confirmed:  Verbally with patient Location:    Type:  Abscess   Size:  2.5cm x 2.5cm   Location:  Lower extremity   Lower extremity location:  Leg   Leg location:  R upper leg Pre-procedure  details:    Skin preparation:  Betadine Anesthesia:    Anesthesia method:  Local infiltration   Local anesthetic:  Lidocaine  1% w/o epi Procedure type:    Complexity:  Complex Procedure details:    Incision types:  Single straight   Incision depth:  Subcutaneous   Wound management:  Probed and deloculated, irrigated with saline and extensive cleaning   Drainage:  Purulent   Drainage amount:  Moderate   Packing materials:  1/4 in gauze   Amount 1/4:  2 Post-procedure details:    Procedure completion:  Tolerated well, no immediate complications .Incision and Drainage  Date/Time: 03/26/2024 2:25 AM  Performed by: Beverley Leita LABOR, PA-C Authorized by: Beverley Leita LABOR, PA-C   Consent:    Consent obtained:  Verbal   Consent given by:  Patient   Risks discussed:  Bleeding, incomplete drainage, pain and damage to other organs   Alternatives discussed:  No treatment Universal protocol:    Procedure explained and questions answered to patient or proxy's satisfaction: yes     Relevant documents present and verified: yes     Test results available : yes     Imaging studies available: yes     Required blood products, implants, devices, and special equipment available: yes     Site/side marked: yes     Immediately prior to procedure, a time out was called: yes     Patient identity confirmed:  Verbally with patient Location:    Type:  Abscess   Size:  3cm x 2cm   Location:  Trunk   Trunk location:  Chest Pre-procedure details:    Skin preparation:  Betadine Anesthesia:    Anesthesia method:  Local infiltration   Local anesthetic:  Lidocaine  1% WITH epi Procedure type:    Complexity:  Complex Procedure details:    Incision types:  Single straight   Incision depth:  Subcutaneous   Wound management:  Probed and deloculated, irrigated with saline and extensive cleaning   Drainage:  Purulent   Drainage amount:  Moderate   Packing materials:  1/4 in gauze   Amount 1/4:   2 Post-procedure details:    Procedure completion:  Tolerated well, no immediate complications    Medications Ordered in the ED  lidocaine -EPINEPHrine  (XYLOCAINE  W/EPI) 2 %-1:200000 (PF) injection 10 mL (10 mLs Infiltration Given 03/26/24 0151)  Tdap (BOOSTRIX) injection 0.5 mL (0.5 mLs Intramuscular Given 03/26/24 0136)  penicillin  g benzathine (BICILLIN  LA) 1200000 UNIT/2ML injection 2.4 Million Units (2.4 Million Units Intramuscular Given 03/26/24 0139)  Medical Decision Making Risk Prescription drug management.   43 year old male presents emergency room with complaint of abscess and penile lesions.  He has 2 areas 1 located on the right chest near right axillary area as well as right lateral thigh, overlying erythema with fluctuance.  These were treated with I&D and packed.  He is started on doxycycline  for this.  Recommend follow-up in urgent care in 2 days for packing removal.  On chart review, patient is positive for syphilis on his last hospital admission, treated with Bicillin .  Given information for the health department and advised he may need additional injections.  Advised to abstain from intercourse.  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  Recommend follow-up with urology, provided with referral for this as well.  At discharge, patient is requesting a place to stay for the next 30 days so that he can attend to his wounds.  Provided with shelter list.  He plans to go to behavioral health.  He denies suicidal or homicidal ideation.     Final diagnoses:  Abscess of chest wall  Abscess of right thigh  Syphilis  Penile lesion    ED Discharge Orders          Ordered    doxycycline  (VIBRAMYCIN ) 100 MG capsule  2 times daily        03/26/24 0223               Beverley Leita LABOR, PA-C 03/26/24 EVIE    Griselda Norris, MD 03/26/24 [phone]

## 2024-03-26 NOTE — Discharge Instructions (Addendum)
 Go to any urgent care in 2 days for packing removal from both of your abscesses. Take antibiotics as prescribed.   Do NOT have sex until you are cleared by the health department to prevent spreading infection. Please follow-up with the health department for recheck, may need weekly antibiotic injections to treat syphilis.  Follow-up with urology for penile lesions.  Recheck with primary care provider, please contact [place] and wellness.

## 2024-03-26 NOTE — ED Notes (Signed)
 Patient in two gowns, one wrapped around his legs as a skirt. When told that the gown was on the wrong way, patient stated its a dress. Patient given a second gown. Patient put second gown on correctly. First gown still in place as a skirt. Patient resting at this time.

## 2024-03-26 NOTE — ED Triage Notes (Addendum)
 Pt in with abscess to R thigh and under R axilla, pt cannot clearly state when they first appeared, denies seeing any drainage. Skin warm and red surrounding R anterior thigh abscess. Endorses ETOH use tonight

## 2024-03-28 LAB — GC/CHLAMYDIA PROBE AMP (~~LOC~~) NOT AT ARMC
Chlamydia: NEGATIVE
Comment: NEGATIVE
Comment: NORMAL
Neisseria Gonorrhea: NEGATIVE

## 2024-03-29 ENCOUNTER — Emergency Department (HOSPITAL_COMMUNITY): Payer: Self-pay

## 2024-03-29 ENCOUNTER — Inpatient Hospital Stay (HOSPITAL_COMMUNITY)
Admission: EM | Admit: 2024-03-29 | Discharge: 2024-04-01 | DRG: 988 | Disposition: A | Discharge status: Home / Self Care | Payer: Self-pay | Attending: Family Medicine | Admitting: Family Medicine

## 2024-03-29 ENCOUNTER — Other Ambulatory Visit: Payer: Self-pay

## 2024-03-29 ENCOUNTER — Encounter (HOSPITAL_COMMUNITY): Payer: Self-pay

## 2024-03-29 DIAGNOSIS — Z5982 Transportation insecurity: Secondary | ICD-10-CM

## 2024-03-29 DIAGNOSIS — F319 Bipolar disorder, unspecified: Secondary | ICD-10-CM | POA: Diagnosis present

## 2024-03-29 DIAGNOSIS — K860 Alcohol-induced chronic pancreatitis: Secondary | ICD-10-CM | POA: Diagnosis present

## 2024-03-29 DIAGNOSIS — K920 Hematemesis: Secondary | ICD-10-CM | POA: Diagnosis present

## 2024-03-29 DIAGNOSIS — Z811 Family history of alcohol abuse and dependence: Secondary | ICD-10-CM

## 2024-03-29 DIAGNOSIS — K2920 Alcoholic gastritis without bleeding: Secondary | ICD-10-CM | POA: Diagnosis present

## 2024-03-29 DIAGNOSIS — I728 Aneurysm of other specified arteries: Secondary | ICD-10-CM | POA: Diagnosis present

## 2024-03-29 DIAGNOSIS — R042 Hemoptysis: Secondary | ICD-10-CM | POA: Diagnosis present

## 2024-03-29 DIAGNOSIS — K863 Pseudocyst of pancreas: Secondary | ICD-10-CM | POA: Diagnosis present

## 2024-03-29 DIAGNOSIS — Z5941 Food insecurity: Secondary | ICD-10-CM

## 2024-03-29 DIAGNOSIS — F1020 Alcohol dependence, uncomplicated: Secondary | ICD-10-CM | POA: Diagnosis present

## 2024-03-29 DIAGNOSIS — K859 Acute pancreatitis without necrosis or infection, unspecified: Principal | ICD-10-CM | POA: Diagnosis present

## 2024-03-29 DIAGNOSIS — Z79899 Other long term (current) drug therapy: Secondary | ICD-10-CM

## 2024-03-29 DIAGNOSIS — F17210 Nicotine dependence, cigarettes, uncomplicated: Secondary | ICD-10-CM | POA: Diagnosis present

## 2024-03-29 DIAGNOSIS — A539 Syphilis, unspecified: Secondary | ICD-10-CM | POA: Diagnosis present

## 2024-03-29 DIAGNOSIS — Z91128 Patient's intentional underdosing of medication regimen for other reason: Secondary | ICD-10-CM

## 2024-03-29 DIAGNOSIS — T471X6A Underdosing of other antacids and anti-gastric-secretion drugs, initial encounter: Secondary | ICD-10-CM | POA: Diagnosis present

## 2024-03-29 DIAGNOSIS — D649 Anemia, unspecified: Secondary | ICD-10-CM | POA: Diagnosis present

## 2024-03-29 DIAGNOSIS — K852 Alcohol induced acute pancreatitis without necrosis or infection: Principal | ICD-10-CM | POA: Diagnosis present

## 2024-03-29 LAB — CBC WITH DIFFERENTIAL/PLATELET
Abs Immature Granulocytes: 0.08 K/uL — ABNORMAL HIGH (ref 0.00–0.07)
Basophils Absolute: 0.1 K/uL (ref 0.0–0.1)
Basophils Relative: 1 %
Eosinophils Absolute: 0.1 K/uL (ref 0.0–0.5)
Eosinophils Relative: 1 %
HCT: 36.2 % — ABNORMAL LOW (ref 39.0–52.0)
Hemoglobin: 11.5 g/dL — ABNORMAL LOW (ref 13.0–17.0)
Immature Granulocytes: 1 %
Lymphocytes Relative: 17 %
Lymphs Abs: 1.9 K/uL (ref 0.7–4.0)
MCH: 26.3 pg (ref 26.0–34.0)
MCHC: 31.8 g/dL (ref 30.0–36.0)
MCV: 82.8 fL (ref 80.0–100.0)
Monocytes Absolute: 0.6 K/uL (ref 0.1–1.0)
Monocytes Relative: 6 %
Neutro Abs: 8.5 K/uL — ABNORMAL HIGH (ref 1.7–7.7)
Neutrophils Relative %: 74 %
Platelets: 449 K/uL — ABNORMAL HIGH (ref 150–400)
RBC: 4.37 MIL/uL (ref 4.22–5.81)
RDW: 17.5 % — ABNORMAL HIGH (ref 11.5–15.5)
WBC: 11.3 K/uL — ABNORMAL HIGH (ref 4.0–10.5)
nRBC: 0 % (ref 0.0–0.2)

## 2024-03-29 LAB — TYPE AND SCREEN
ABO/RH(D): O POS
Antibody Screen: NEGATIVE

## 2024-03-29 LAB — PROTIME-INR
INR: 1 (ref 0.8–1.2)
Prothrombin Time: 13.6 s (ref 11.4–15.2)

## 2024-03-29 LAB — LIPASE, BLOOD: Lipase: 193 U/L — ABNORMAL HIGH (ref 11–51)

## 2024-03-29 LAB — COMPREHENSIVE METABOLIC PANEL WITH GFR
ALT: 19 U/L (ref 0–44)
AST: 29 U/L (ref 15–41)
Albumin: 3.6 g/dL (ref 3.5–5.0)
Alkaline Phosphatase: 93 U/L (ref 38–126)
Anion gap: 13 (ref 5–15)
BUN: 13 mg/dL (ref 6–20)
CO2: 25 mmol/L (ref 22–32)
Calcium: 9.2 mg/dL (ref 8.9–10.3)
Chloride: 98 mmol/L (ref 98–111)
Creatinine, Ser: 0.61 mg/dL (ref 0.61–1.24)
GFR, Estimated: >60 mL/min (ref >60)
Glucose, Bld: 125 mg/dL — ABNORMAL HIGH (ref 70–99)
Potassium: 3.9 mmol/L (ref 3.5–5.1)
Sodium: 136 mmol/L (ref 135–145)
Total Bilirubin: 0.6 mg/dL (ref 0.0–1.2)
Total Protein: 8.3 g/dL — ABNORMAL HIGH (ref 6.5–8.1)

## 2024-03-29 LAB — ABO/RH: ABO/RH(D): O POS

## 2024-03-29 MED ORDER — MORPHINE SULFATE (PF) 4 MG/ML IV SOLN
4.0000 mg | Freq: Once | INTRAVENOUS | Status: AC
  Administered 2024-03-29: 4 mg via INTRAVENOUS
  Filled 2024-03-29: qty 1

## 2024-03-29 MED ORDER — ONDANSETRON HCL 4 MG PO TABS: 4.0000 mg | ORAL_TABLET | Freq: Four times a day (QID) | ORAL | Status: DC | PRN

## 2024-03-29 MED ORDER — ALBUTEROL SULFATE (2.5 MG/3ML) 0.083% IN NEBU: 2.5000 mg | INHALATION_SOLUTION | RESPIRATORY_TRACT | Status: DC | PRN

## 2024-03-29 MED ORDER — THIAMINE HCL 100 MG/ML IJ SOLN
100.0000 mg | Freq: Every day | INTRAMUSCULAR | Status: DC
  Filled 2024-03-29: qty 2

## 2024-03-29 MED ORDER — ACETAMINOPHEN 325 MG PO TABS: 650.0000 mg | ORAL_TABLET | Freq: Four times a day (QID) | ORAL | Status: DC | PRN

## 2024-03-29 MED ORDER — ONDANSETRON HCL 4 MG/2ML IJ SOLN
4.0000 mg | Freq: Once | INTRAMUSCULAR | Status: AC
  Administered 2024-03-29: 4 mg via INTRAVENOUS
  Filled 2024-03-29: qty 2

## 2024-03-29 MED ORDER — FOLIC ACID 1 MG PO TABS
1.0000 mg | ORAL_TABLET | Freq: Every day | ORAL | Status: DC
  Administered 2024-03-29 – 2024-04-01 (×4): 1 mg via ORAL
  Filled 2024-03-29 (×4): qty 1

## 2024-03-29 MED ORDER — PANTOPRAZOLE SODIUM 40 MG IV SOLR
40.0000 mg | Freq: Once | INTRAVENOUS | Status: AC
  Administered 2024-03-29: 40 mg via INTRAVENOUS
  Filled 2024-03-29: qty 10

## 2024-03-29 MED ORDER — PANTOPRAZOLE SODIUM 40 MG IV SOLR
40.0000 mg | INTRAVENOUS | Status: DC
  Administered 2024-03-30 – 2024-03-31 (×2): 40 mg via INTRAVENOUS
  Filled 2024-03-29 (×2): qty 10

## 2024-03-29 MED ORDER — ONDANSETRON HCL 4 MG/2ML IJ SOLN
4.0000 mg | Freq: Four times a day (QID) | INTRAMUSCULAR | Status: DC | PRN
  Administered 2024-03-30: 4 mg via INTRAVENOUS
  Filled 2024-03-29: qty 2

## 2024-03-29 MED ORDER — OXYCODONE HCL 5 MG PO TABS
5.0000 mg | ORAL_TABLET | ORAL | Status: DC | PRN
  Administered 2024-03-29 – 2024-04-01 (×10): 5 mg via ORAL
  Filled 2024-03-29 (×10): qty 1

## 2024-03-29 MED ORDER — LACTATED RINGERS IV SOLN: INTRAVENOUS | Status: AC

## 2024-03-29 MED ORDER — ACETAMINOPHEN 650 MG RE SUPP: 650.0000 mg | Freq: Four times a day (QID) | RECTAL | Status: DC | PRN

## 2024-03-29 MED ORDER — ADULT MULTIVITAMIN W/MINERALS CH
1.0000 | ORAL_TABLET | Freq: Every day | ORAL | Status: DC
  Administered 2024-03-29 – 2024-04-01 (×4): 1 via ORAL
  Filled 2024-03-29 (×4): qty 1

## 2024-03-29 MED ORDER — THIAMINE MONONITRATE 100 MG PO TABS
100.0000 mg | ORAL_TABLET | Freq: Every day | ORAL | Status: DC
  Administered 2024-03-29 – 2024-04-01 (×4): 100 mg via ORAL
  Filled 2024-03-29 (×4): qty 1

## 2024-03-29 MED ORDER — IOHEXOL 350 MG/ML SOLN
100.0000 mL | Freq: Once | INTRAVENOUS | Status: AC | PRN
  Administered 2024-03-29: 100 mL via INTRAVENOUS

## 2024-03-29 MED ORDER — LORAZEPAM 1 MG PO TABS
1.0000 mg | ORAL_TABLET | ORAL | Status: DC | PRN
  Administered 2024-03-30 – 2024-03-31 (×2): 1 mg via ORAL
  Filled 2024-03-29 (×2): qty 1

## 2024-03-29 MED ORDER — HYDROMORPHONE HCL 1 MG/ML IJ SOLN
0.5000 mg | INTRAMUSCULAR | Status: DC | PRN
  Administered 2024-03-29 – 2024-04-01 (×16): 1 mg via INTRAVENOUS
  Filled 2024-03-29 (×16): qty 1

## 2024-03-29 NOTE — H&P (Addendum)
 History and Physical  Antonio Alvarez FMW:[id number] DOB: 17-Jul-1981 DOA: 03/29/2024  PCP: Vicci Barnie NOVAK, MD   Chief Complaint: Abdominal pain  HPI: Antonio Alvarez is a 43 y.o. male with medical history significant for tobacco abuse, alcohol abuse, associated chronic pancreatitis and gastritis being admitted to the hospital with recurrent abdominal pain and acute on chronic pancreatitis as well as gastritis.  History is provided by the patient, states he has been drinking alcohol on and off the last 10 days since he was discharged from the hospital.  Presented to the emergency department this afternoon with left lower quadrant abdominal pain, which she typically has when his pancreas is flaring up.  He also coughed up a blood clot this afternoon.  States he has not been taking any of his prescribed medications.  Started feeling some mild nausea this morning, eventually threw up, and later coughed up a blood clot.  Workup in the emergency department as detailed below shows evidence of continued pancreatic pseudocyst and inflammation.  Review of Systems: Please see HPI for pertinent positives and negatives. A complete 10 system review of systems are otherwise negative.  Past Medical History:  Diagnosis Date   Abscess    hx of abscess on neck   Alcohol abuse    Anxiety    Bipolar disorder (HCC)    Cellulitis    Chronic back pain    Depression    Hyponatremia    Insomnia    Leukocytosis    Pancreatitis    Past Surgical History:  Procedure Laterality Date   INCISION AND DRAINAGE ABSCESS  X 3   back, both sides of my neck   Social History:  reports that he has been smoking cigarettes. He has a 10.5 pack-year smoking history. He has never used smokeless tobacco. He reports current alcohol use of about 49.0 standard drinks of alcohol per week. He reports current drug use. Drug: Marijuana.  No Known Allergies  Family History  Problem Relation Age of Onset   Alcohol abuse Father       Prior to Admission medications   Medication Sig Start Date End Date Taking? Authorizing Provider  doxycycline  (VIBRAMYCIN ) 100 MG capsule Take 1 capsule (100 mg total) by mouth 2 (two) times daily. 03/26/24   Beverley Leita LABOR, PA-C  ferrous sulfate  325 (65 FE) MG tablet Take 1 tablet (325 mg total) by mouth daily with breakfast. 03/19/24 06/17/24  Vernon Ranks, MD  folic acid  (FOLVITE ) 1 MG tablet Take 1 tablet (1 mg total) by mouth daily. Patient not taking: Reported on 03/17/2024 07/08/17   Jerri Keys, MD  omeprazole  (PRILOSEC) 40 MG capsule Take 1 capsule (40 mg total) by mouth 2 (two) times daily for 28 days. 03/19/24 04/16/24  Vernon Ranks, MD  tamsulosin  (FLOMAX ) 0.4 MG CAPS capsule Take 1 capsule (0.4 mg total) by mouth daily. Patient not taking: Reported on 03/17/2024 07/08/17   Jerri Keys, MD  thiamine  100 MG tablet Take 1 tablet (100 mg total) by mouth daily. Patient not taking: Reported on 03/17/2024 07/08/17   Jerri Keys, MD    Physical Exam: BP (!) 124/92   Pulse (!) 58   Temp 98 F (36.7 C) (Oral)   Resp 16   Ht 5' 10 (1.778 m)   Wt 68 kg   SpO2 99%   BMI 21.51 kg/m  General:  Alert, oriented, calm, in no acute distress, resting comfortably, pleasant and cooperative Cardiovascular: RRR, no murmurs or rubs, no peripheral edema  Respiratory: clear to auscultation bilaterally, no wheezes, no crackles  Abdomen: soft, tender in the left lower quadrant, nondistended, normal bowel tones heard  Skin: dry, no rashes  Musculoskeletal: no joint effusions, normal range of motion  Psychiatric: appropriate affect, normal speech  Neurologic: extraocular muscles intact, clear speech, moving all extremities with intact sensorium         Labs on Admission:  Basic Metabolic Panel: Recent Labs  Lab 03/29/24 1716  NA 136  K 3.9  CL 98  CO2 25  GLUCOSE 125*  BUN 13  CREATININE 0.61  CALCIUM 9.2   Liver Function Tests: Recent Labs  Lab 03/29/24 1716  AST 29  ALT 19  ALKPHOS 93   BILITOT 0.6  PROT 8.3*  ALBUMIN 3.6   Recent Labs  Lab 03/29/24 1716  LIPASE 193*   No results for input(s): AMMONIA in the last 168 hours. CBC: Recent Labs  Lab 03/29/24 1716  WBC 11.3*  NEUTROABS 8.5*  HGB 11.5*  HCT 36.2*  MCV 82.8  PLT 449*   Cardiac Enzymes: No results for input(s): CKTOTAL, CKMB, CKMBINDEX, TROPONINI in the last 168 hours. BNP (last 3 results) No results for input(s): BNP in the last 8760 hours.  ProBNP (last 3 results) No results for input(s): PROBNP in the last 8760 hours.  CBG: No results for input(s): GLUCAP in the last 168 hours.  Radiological Exams on Admission: CT ABDOMEN PELVIS W CONTRAST Result Date: 03/29/2024 CLINICAL DATA:  Acute abdominal pain and history of weight loss, initial encounter EXAM: CT ABDOMEN AND PELVIS WITH CONTRAST TECHNIQUE: Multidetector CT imaging of the abdomen and pelvis was performed using the standard protocol following bolus administration of intravenous contrast. RADIATION DOSE REDUCTION: This exam was performed according to the departmental dose-optimization program which includes automated exposure control, adjustment of the mA and/or kV according to patient size and/or use of iterative reconstruction technique. CONTRAST:  OMNIPAQUE  IOHEXOL  350 MG/ML SOLN COMPARISON:  03/17/2024 FINDINGS: Hepatobiliary: No focal liver abnormality is seen. No gallstones, gallbladder wall thickening, or biliary dilatation. Pancreas: Pancreas demonstrates calcifications in the uncinate process as well as in the tail of the pancreas. These are stable in appearance from the prior exam. There is a persistent pseudocyst in the tail of the pancreas which measures 4.6 cm in greatest dimension. Area of increased attenuation is noted on the portal venous phase images along the lateral component which measures approximately 17 mm in greatest dimension. This is larger than that seen on the prior exam and demonstrates washout on  delayed images. These findings raise suspicion for pseudoaneurysm related to the pseudocyst. Feeding vessel is not well visualized. Spleen: Normal in size without focal abnormality. Small amount of perisplenic fluid is noted. Adrenals/Urinary Tract: Adrenal glands are within normal limits. Kidneys demonstrate a normal enhancement pattern bilaterally. No renal calculi or obstructive changes are seen. The bladder is well distended. Stomach/Bowel: No obstructive or inflammatory changes of the colon are seen. The appendix is within normal limits. No inflammatory changes are seen. Small bowel and stomach are within normal limits aside from extrinsic compression on the stomach by the pancreatic pseudocyst. The previously seen findings suspicious for communication between the cyst and stomach are less prominent than on the prior exam. Vascular/Lymphatic: No aortic abnormality is noted. Mild narrowing of the splenic vein is seen as it passes the pancreatic pseudocyst stable from the prior exam. Increased venous collaterals are noted from the spleen surrounding the stomach. Reproductive: Prostate is unremarkable. Other: Some free fluid  is noted in the upper abdomen surrounding the stomach and spleen. This may be related to some persistent changes of acute pancreatitis. Musculoskeletal: Degenerative changes of lumbar spine. No acute bony abnormality noted. 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 Within the peripheral aspect of the pseudocyst, there is an area of enhancement which washes out on delayed images highly suspicious for pseudoaneurysm. This has increased in size when compared with the prior exam. Donor vessel is not well appreciated on this study. Critical Value/emergent results were called by telephone at the time of interpretation on 03/29/2024 at 7:26 pm to provider [facility] , who verbally  acknowledged these results. No enlarged abdominal or pelvic lymph nodes. Electronically Signed   By: Oneil Devonshire M.D.   On: 03/29/2024 19:29   CT Angio Chest PE W and/or Wo Contrast Result Date: 03/29/2024 CLINICAL DATA:  History of 10 pound weight loss and hemoptysis for 1 month, initial encounter EXAM: CT ANGIOGRAPHY CHEST WITH CONTRAST TECHNIQUE: Multidetector CT imaging of the chest was performed using the standard protocol during bolus administration of intravenous contrast. Multiplanar CT image reconstructions and MIPs were obtained to evaluate the vascular anatomy. RADIATION DOSE REDUCTION: This exam was performed according to the departmental dose-optimization program which includes automated exposure control, adjustment of the mA and/or kV according to patient size and/or use of iterative reconstruction technique. CONTRAST:  OMNIPAQUE  IOHEXOL  350 MG/ML SOLN COMPARISON:  03/17/2024 FINDINGS: Cardiovascular: Thoracic aorta shows no aneurysmal dilatation or dissection. No cardiac enlargement is seen. The pulmonary artery shows a normal branching pattern bilaterally. No focal filling defect to suggest pulmonary embolism is noted. No coronary calcifications are seen. Mediastinum/Nodes: Thoracic inlet is within normal limits. No hilar or mediastinal adenopathy is noted. The esophagus as visualized is within normal limits. Lungs/Pleura: Lungs are well aerated bilaterally. No focal infiltrate or sizable effusion is seen. No parenchymal nodules are noted. Musculoskeletal: No acute rib abnormality is noted. Review of the MIP images confirms the above findings. IMPRESSION: No evidence of pulmonary emboli. No acute abnormality noted. Electronically Signed   By: Oneil Devonshire M.D.   On: 03/29/2024 19:14   DG Chest 2 View Result Date: 03/29/2024 EXAM: 2 VIEW(S) XRAY OF THE CHEST 03/29/2024 05:24:14 PM COMPARISON: 03/17/2024 CLINICAL HISTORY: [id number] Hemoptysis [id number]. Per chart: BIB EMS from home for coughing  up blood for the last month that is worsening. Reports 10lb weight loss over the last month. FINDINGS: LUNGS AND PLEURA: No focal pulmonary opacity. No pulmonary edema. No pleural effusion. No pneumothorax. HEART AND MEDIASTINUM: No acute abnormality of the cardiac and mediastinal silhouettes. BONES AND SOFT TISSUES: No acute osseous abnormality. IMPRESSION: 1. No acute process. Electronically signed by: Norman Gatlin MD 03/29/2024 05:26 PM EDT RP Workstation: [id number]   Assessment/Plan Antonio Alvarez is a 43 y.o. male with medical history significant for tobacco abuse, alcohol abuse, associated chronic pancreatitis and gastritis being admitted to the hospital with recurrent abdominal pain and acute on chronic pancreatitis as well as gastritis.  Abdominal pain and nausea-possibly related to acute on chronic pancreatitis versus gastritis, potentially both.  He has not been compliant with proton pump inhibitor therapy, and has continued to drink alcohol. -Observation admission -IV fluids -N.p.o. -Pain and nausea medication as needed  Acute on chronic pancreatitis-in the setting of known pancreatic pseudocyst, pancreatitis related to alcohol abuse.  Treat as above.  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  Has not been compliant with oral medications, has continued to drink alcohol.  Now with recurrent nausea as well as hematemesis. -IV PPI  Alcohol abuse-patient states his last drink was 7/14, as documented during his previous hospitalization he is not very forthright about his drinking. -Thiamine , folate, multivitamin -P.o. Ativan  per CIWA protocol  Possible pseudoaneurysm-seen today on CT scan in the emergency department, ER provider discussed with interventional radiology who will review imaging once again in the morning, but does not feel that any acute intervention is indicated.  RPR positive-patient was noted to have genital and scrotal ulcers during his  last hospitalization, after discharge was noted to have positive RPR. -Check treponemal antibody  DVT prophylaxis: SCDs only    Code Status: Full Code  Consults called: IR  Admission status: Observation  Time spent: 48 minutes  Jaskirat Zertuche CHRISTELLA Gail MD [facility] Pager [phone]  If 7PM-7AM, please contact night-coverage [email or web address] Password [facility]  03/29/2024, 8:27 PM

## 2024-03-29 NOTE — ED Provider Notes (Signed)
 Antonio Alvarez Provider Note   CSN: [id number] Arrival date & time: 03/29/24  1609     Patient presents with: Hemoptysis   Antonio Alvarez is a 43 y.o. male.   HPI   Patient has a history of alcohol abuse pancreatitis chronic back pain, bipolar disorder.  Patient presents ED with complaints of coughing or vomiting up blood today.  Patient states he was feeling nauseous.  He cannot really tell me if he was vomiting or coughing.  Patient states it was both.  Patient states initially thought he vomited up dark-colored material but he also had been eating something that had chocolate.  He then coughed as well.  Patient states he coughed up a large clot of blood.  He states he has been having some pain as well in the left side of his chest and abdomen.  No known fevers.  She denies any blood in his stool.  No dark black stool.  No known fevers.  Patient does smoke.  Prior to Admission medications   Medication Sig Start Date End Date Taking? Authorizing Provider  doxycycline  (VIBRAMYCIN ) 100 MG capsule Take 1 capsule (100 mg total) by mouth 2 (two) times daily. 03/26/24   Beverley Leita LABOR, PA-C  ferrous sulfate  325 (65 FE) MG tablet Take 1 tablet (325 mg total) by mouth daily with breakfast. 03/19/24 06/17/24  Vernon Ranks, MD  folic acid  (FOLVITE ) 1 MG tablet Take 1 tablet (1 mg total) by mouth daily. Patient not taking: Reported on 03/17/2024 07/08/17   Jerri Keys, MD  omeprazole  (PRILOSEC) 40 MG capsule Take 1 capsule (40 mg total) by mouth 2 (two) times daily for 28 days. 03/19/24 04/16/24  Vernon Ranks, MD  tamsulosin  (FLOMAX ) 0.4 MG CAPS capsule Take 1 capsule (0.4 mg total) by mouth daily. Patient not taking: Reported on 03/17/2024 07/08/17   Jerri Keys, MD  thiamine  100 MG tablet Take 1 tablet (100 mg total) by mouth daily. Patient not taking: Reported on 03/17/2024 07/08/17   Jerri Keys, MD    Allergies: Patient has no known allergies.    Review of  Systems  Updated Vital Signs BP (!) 124/92   Pulse (!) 58   Temp 98 F (36.7 C) (Oral)   Resp 16   Ht 1.778 m (5' 10)   Wt 68 kg   SpO2 99%   BMI 21.51 kg/m   Physical Exam Vitals and nursing note reviewed.  Constitutional:      Appearance: He is well-developed. He is not diaphoretic.  HENT:     Head: Normocephalic and atraumatic.     Right Ear: External ear normal.     Left Ear: External ear normal.  Eyes:     General: No scleral icterus.       Right eye: No discharge.        Left eye: No discharge.     Conjunctiva/sclera: Conjunctivae normal.  Neck:     Trachea: No tracheal deviation.  Cardiovascular:     Rate and Rhythm: Normal rate and regular rhythm.  Pulmonary:     Effort: Pulmonary effort is normal. No respiratory distress.     Breath sounds: Normal breath sounds. No stridor. No wheezing or rales.  Abdominal:     General: Bowel sounds are normal. There is no distension.     Palpations: Abdomen is soft.     Tenderness: There is abdominal tenderness. There is no guarding or rebound.  Musculoskeletal:  General: No tenderness or deformity.     Cervical back: Neck supple.  Skin:    General: Skin is warm and dry.     Findings: No rash.  Neurological:     General: No focal deficit present.     Mental Status: He is alert.     Cranial Nerves: No cranial nerve deficit, dysarthria or facial asymmetry.     Sensory: No sensory deficit.     Motor: No abnormal muscle tone or seizure activity.     Coordination: Coordination normal.  Psychiatric:        Mood and Affect: Mood normal.     (all labs ordered are listed, but only abnormal results are displayed) Labs Reviewed  COMPREHENSIVE METABOLIC PANEL WITH GFR - Abnormal; Notable for the following components:      Result Value   Glucose, Bld 125 (*)    Total Protein 8.3 (*)    All other components within normal limits  CBC WITH DIFFERENTIAL/PLATELET - Abnormal; Notable for the following components:   WBC  11.3 (*)    Hemoglobin 11.5 (*)    HCT 36.2 (*)    RDW 17.5 (*)    Platelets 449 (*)    Neutro Abs 8.5 (*)    Abs Immature Granulocytes 0.08 (*)    All other components within normal limits  LIPASE, BLOOD - Abnormal; Notable for the following components:   Lipase 193 (*)    All other components within normal limits  PROTIME-INR  TYPE AND SCREEN  ABO/RH    EKG: None  Radiology: CT ABDOMEN PELVIS W CONTRAST Result Date: 03/29/2024 CLINICAL DATA:  Acute abdominal pain and history of weight loss, initial encounter EXAM: CT ABDOMEN AND PELVIS WITH CONTRAST TECHNIQUE: Multidetector CT imaging of the abdomen and pelvis was performed using the standard protocol following bolus administration of intravenous contrast. RADIATION DOSE REDUCTION: This exam was performed according to the departmental dose-optimization program which includes automated exposure control, adjustment of the mA and/or kV according to patient size and/or use of iterative reconstruction technique. CONTRAST:  OMNIPAQUE  IOHEXOL  350 MG/ML SOLN COMPARISON:  03/17/2024 FINDINGS: Hepatobiliary: No focal liver abnormality is seen. No gallstones, gallbladder wall thickening, or biliary dilatation. Pancreas: Pancreas demonstrates calcifications in the uncinate process as well as in the tail of the pancreas. These are stable in appearance from the prior exam. There is a persistent pseudocyst in the tail of the pancreas which measures 4.6 cm in greatest dimension. Area of increased attenuation is noted on the portal venous phase images along the lateral component which measures approximately 17 mm in greatest dimension. This is larger than that seen on the prior exam and demonstrates washout on delayed images. These findings raise suspicion for pseudoaneurysm related to the pseudocyst. Feeding vessel is not well visualized. Spleen: Normal in size without focal abnormality. Small amount of perisplenic fluid is noted. Adrenals/Urinary  Tract: Adrenal glands are within normal limits. Kidneys demonstrate a normal enhancement pattern bilaterally. No renal calculi or obstructive changes are seen. The bladder is well distended. Stomach/Bowel: No obstructive or inflammatory changes of the colon are seen. The appendix is within normal limits. No inflammatory changes are seen. Small bowel and stomach are within normal limits aside from extrinsic compression on the stomach by the pancreatic pseudocyst. The previously seen findings suspicious for communication between the cyst and stomach are less prominent than on the prior exam. Vascular/Lymphatic: No aortic abnormality is noted. Mild narrowing of the splenic vein is seen as it passes  the pancreatic pseudocyst stable from the prior exam. Increased venous collaterals are noted from the spleen surrounding the stomach. Reproductive: Prostate is unremarkable. Other: Some free fluid is noted in the upper abdomen surrounding the stomach and spleen. This may be related to some persistent changes of acute pancreatitis. Musculoskeletal: Degenerative changes of lumbar spine. No acute bony abnormality noted. 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 Within the peripheral aspect of the pseudocyst, there is an area of enhancement which washes out on delayed images highly suspicious for pseudoaneurysm. This has increased in size when compared with the prior exam. Donor vessel is not well appreciated on this study. Critical Value/emergent results were called by telephone at the time of interpretation on 03/29/2024 at 7:26 pm to provider [facility] , who verbally acknowledged these results. No enlarged abdominal or pelvic lymph nodes. Electronically Signed   By: Oneil Devonshire M.D.   On: 03/29/2024 19:29   CT Angio Chest PE W and/or Wo Contrast Result Date: 03/29/2024 CLINICAL DATA:  History of 10 pound  weight loss and hemoptysis for 1 month, initial encounter EXAM: CT ANGIOGRAPHY CHEST WITH CONTRAST TECHNIQUE: Multidetector CT imaging of the chest was performed using the standard protocol during bolus administration of intravenous contrast. Multiplanar CT image reconstructions and MIPs were obtained to evaluate the vascular anatomy. RADIATION DOSE REDUCTION: This exam was performed according to the departmental dose-optimization program which includes automated exposure control, adjustment of the mA and/or kV according to patient size and/or use of iterative reconstruction technique. CONTRAST:  OMNIPAQUE  IOHEXOL  350 MG/ML SOLN COMPARISON:  03/17/2024 FINDINGS: Cardiovascular: Thoracic aorta shows no aneurysmal dilatation or dissection. No cardiac enlargement is seen. The pulmonary artery shows a normal branching pattern bilaterally. No focal filling defect to suggest pulmonary embolism is noted. No coronary calcifications are seen. Mediastinum/Nodes: Thoracic inlet is within normal limits. No hilar or mediastinal adenopathy is noted. The esophagus as visualized is within normal limits. Lungs/Pleura: Lungs are well aerated bilaterally. No focal infiltrate or sizable effusion is seen. No parenchymal nodules are noted. Musculoskeletal: No acute rib abnormality is noted. Review of the MIP images confirms the above findings. IMPRESSION: No evidence of pulmonary emboli. No acute abnormality noted. Electronically Signed   By: Oneil Devonshire M.D.   On: 03/29/2024 19:14   DG Chest 2 View Result Date: 03/29/2024 EXAM: 2 VIEW(S) XRAY OF THE CHEST 03/29/2024 05:24:14 PM COMPARISON: 03/17/2024 CLINICAL HISTORY: [id number] Hemoptysis [id number]. Per chart: BIB EMS from home for coughing up blood for the last month that is worsening. Reports 10lb weight loss over the last month. FINDINGS: LUNGS AND PLEURA: No focal pulmonary opacity. No pulmonary edema. No pleural effusion. No pneumothorax. HEART AND MEDIASTINUM: No acute  abnormality of the cardiac and mediastinal silhouettes. BONES AND SOFT TISSUES: No acute osseous abnormality. IMPRESSION: 1. No acute process. Electronically signed by: Norman Gatlin MD 03/29/2024 05:26 PM EDT RP Workstation: [id number]     .Critical Care  Performed by: Randol Simmonds, MD Authorized by: Randol Simmonds, MD   Critical care provider statement:    Critical care time (minutes):  30   Critical care was time spent personally by me on the following activities:  Development of treatment plan with patient or surrogate, discussions with consultants, evaluation of patient's response to treatment, examination of patient, ordering and review of laboratory studies, ordering and review of radiographic studies, ordering and performing treatments and interventions, pulse oximetry, re-evaluation of patient's  condition and review of old charts    Medications Ordered in the ED  pantoprazole  (PROTONIX ) injection 40 mg (40 mg Intravenous Given 03/29/24 1704)  ondansetron  (ZOFRAN ) injection 4 mg (4 mg Intravenous Given 03/29/24 1703)  morphine  (PF) 4 MG/ML injection 4 mg (4 mg Intravenous Given 03/29/24 1701)  iohexol  (OMNIPAQUE ) 350 MG/ML injection 100 mL (100 mLs Intravenous Contrast Given 03/29/24 1843)  morphine  (PF) 4 MG/ML injection 4 mg (4 mg Intravenous Given 03/29/24 1914)    Clinical Course as of 03/29/24 2017  Tue Mar 29, 2024  1752 CBC with Differential(!) CBC shows leukocytosis.  Metabolic panel normal.  Anemia stable compared to previous` [JK]  1752 Chest x-ray without acute abnormality [JK]  1905 Lipase, blood(!) Lipase evaded at 193 [JK]  1937 Patient CT scan does not show any acute abnormality of the chest. [JK]  1937 CT ABDOMEN PELVIS W CONTRAST Concern for possible pseudoaneurysm on the CT scan associated with findings of pancreatitis [JK]  1947 Case discussed with Dr Vanice, interventional radiology.  Keep NPO overnight.  Will review in AM [JK]  2011 D/w Dr.  Sheree, will review images  [JK]  2013 Case discussed with Dr Roxane regarding admission [JK]    Clinical Course User Index [JK] Randol Simmonds, MD                                 Medical Decision Making Problems Addressed: Acute pancreatitis, unspecified complication status, unspecified pancreatitis type: acute illness or injury that poses a threat to life or bodily functions  Amount and/or Complexity of Data Reviewed Labs: ordered. Decision-making details documented in ED Course. Radiology: ordered and independent interpretation performed. Decision-making details documented in ED Course.  Risk Prescription drug management. Parenteral controlled substances. Decision regarding hospitalization.    Patient presented to the ED with complaints of abdominal pain as well as possible hematemesis versus hemoptysis.  Patient had an emesis bag with him that showed a large clot of blood.  While the patient was in the ED fortunately did not have any recurrent episodes.  I was concerned about the possibility of hemoptysis as well as hematemesis.  Consider the possibility of pulm  embolism, esophageal varices, gastric ulcer, pancreatitis perforated viscus.  Patient's laboratory test did not show any signs of severe anemia.  His lipase was elevated suggesting a component of pancreatitis. CT scan did not show any [place] pulmonary embolism or other acute finding in the chest there are findings of pancreatitis on the abdominal portion.  There is also the suggestion of a possible pseudoaneurysm.  I consulted with vascular surgery Dr. Sheree as well as interventional radiology Dr. Majel.  Plan is to evaluate patient tomorrow to see if he will require any further intervention.  Case discussed with Dr. Roxane regarding admission     Final diagnoses:  Acute pancreatitis, unspecified complication status, unspecified pancreatitis type    ED Discharge Orders     None          Randol Simmonds, MD 03/29/24 2017

## 2024-03-29 NOTE — ED Triage Notes (Signed)
 BIB EMS from home for coughing up blood for the last month that is worsening. Reports 10lb weight loss over the last month.

## 2024-03-30 ENCOUNTER — Observation Stay (HOSPITAL_COMMUNITY): Payer: Self-pay

## 2024-03-30 DIAGNOSIS — Z5982 Transportation insecurity: Secondary | ICD-10-CM | POA: Diagnosis not present

## 2024-03-30 DIAGNOSIS — F319 Bipolar disorder, unspecified: Secondary | ICD-10-CM | POA: Diagnosis present

## 2024-03-30 DIAGNOSIS — F1020 Alcohol dependence, uncomplicated: Secondary | ICD-10-CM | POA: Diagnosis present

## 2024-03-30 DIAGNOSIS — K860 Alcohol-induced chronic pancreatitis: Secondary | ICD-10-CM | POA: Diagnosis present

## 2024-03-30 DIAGNOSIS — K920 Hematemesis: Secondary | ICD-10-CM | POA: Diagnosis present

## 2024-03-30 DIAGNOSIS — Z91128 Patient's intentional underdosing of medication regimen for other reason: Secondary | ICD-10-CM | POA: Diagnosis not present

## 2024-03-30 DIAGNOSIS — I728 Aneurysm of other specified arteries: Secondary | ICD-10-CM | POA: Diagnosis present

## 2024-03-30 DIAGNOSIS — Z5941 Food insecurity: Secondary | ICD-10-CM | POA: Diagnosis not present

## 2024-03-30 DIAGNOSIS — F17210 Nicotine dependence, cigarettes, uncomplicated: Secondary | ICD-10-CM | POA: Diagnosis present

## 2024-03-30 DIAGNOSIS — T471X6A Underdosing of other antacids and anti-gastric-secretion drugs, initial encounter: Secondary | ICD-10-CM | POA: Diagnosis present

## 2024-03-30 DIAGNOSIS — K863 Pseudocyst of pancreas: Secondary | ICD-10-CM | POA: Diagnosis present

## 2024-03-30 DIAGNOSIS — A539 Syphilis, unspecified: Secondary | ICD-10-CM | POA: Diagnosis present

## 2024-03-30 DIAGNOSIS — K859 Acute pancreatitis without necrosis or infection, unspecified: Secondary | ICD-10-CM | POA: Diagnosis present

## 2024-03-30 DIAGNOSIS — K2920 Alcoholic gastritis without bleeding: Secondary | ICD-10-CM | POA: Diagnosis present

## 2024-03-30 DIAGNOSIS — K852 Alcohol induced acute pancreatitis without necrosis or infection: Secondary | ICD-10-CM | POA: Diagnosis present

## 2024-03-30 DIAGNOSIS — D649 Anemia, unspecified: Secondary | ICD-10-CM | POA: Diagnosis present

## 2024-03-30 DIAGNOSIS — R042 Hemoptysis: Secondary | ICD-10-CM | POA: Diagnosis present

## 2024-03-30 DIAGNOSIS — Z79899 Other long term (current) drug therapy: Secondary | ICD-10-CM | POA: Diagnosis not present

## 2024-03-30 DIAGNOSIS — Z811 Family history of alcohol abuse and dependence: Secondary | ICD-10-CM | POA: Diagnosis not present

## 2024-03-30 HISTORY — PX: IR ANGIOGRAM VISCERAL SELECTIVE: IMG657

## 2024-03-30 HISTORY — PX: IR US GUIDE VASC ACCESS RIGHT: IMG2390

## 2024-03-30 HISTORY — PX: IR EMBO ART  VEN HEMORR LYMPH EXTRAV  INC GUIDE ROADMAPPING: IMG5450

## 2024-03-30 HISTORY — PX: IR ANGIOGRAM SELECTIVE EACH ADDITIONAL VESSEL: IMG667

## 2024-03-30 LAB — CBC
HCT: 31.3 % — ABNORMAL LOW (ref 39.0–52.0)
Hemoglobin: 10 g/dL — ABNORMAL LOW (ref 13.0–17.0)
MCH: 26.7 pg (ref 26.0–34.0)
MCHC: 31.9 g/dL (ref 30.0–36.0)
MCV: 83.7 fL (ref 80.0–100.0)
Platelets: 359 K/uL (ref 150–400)
RBC: 3.74 MIL/uL — ABNORMAL LOW (ref 4.22–5.81)
RDW: 17.4 % — ABNORMAL HIGH (ref 11.5–15.5)
WBC: 9.1 K/uL (ref 4.0–10.5)
nRBC: 0 % (ref 0.0–0.2)

## 2024-03-30 LAB — BASIC METABOLIC PANEL WITH GFR
Anion gap: 9 (ref 5–15)
BUN: 9 mg/dL (ref 6–20)
CO2: 27 mmol/L (ref 22–32)
Calcium: 8.8 mg/dL — ABNORMAL LOW (ref 8.9–10.3)
Chloride: 96 mmol/L — ABNORMAL LOW (ref 98–111)
Creatinine, Ser: 0.77 mg/dL (ref 0.61–1.24)
GFR, Estimated: >60 mL/min (ref >60)
Glucose, Bld: 103 mg/dL — ABNORMAL HIGH (ref 70–99)
Potassium: 3.7 mmol/L (ref 3.5–5.1)
Sodium: 132 mmol/L — ABNORMAL LOW (ref 135–145)

## 2024-03-30 MED ORDER — ORAL CARE MOUTH RINSE: 15.0000 mL | OROMUCOSAL | Status: DC | PRN

## 2024-03-30 MED ORDER — MIDAZOLAM HCL 2 MG/2ML IJ SOLN
INTRAMUSCULAR | Status: AC | PRN
  Administered 2024-03-30: .5 mg via INTRAVENOUS

## 2024-03-30 MED ORDER — FENTANYL CITRATE (PF) 100 MCG/2ML IJ SOLN
INTRAMUSCULAR | Status: AC | PRN
  Administered 2024-03-30: 25 ug via INTRAVENOUS

## 2024-03-30 MED ORDER — IOHEXOL 300 MG/ML  SOLN
100.0000 mL | Freq: Once | INTRAMUSCULAR | Status: AC | PRN
  Administered 2024-03-30: 50 mL via INTRA_ARTERIAL

## 2024-03-30 MED ORDER — IOHEXOL 300 MG/ML  SOLN
50.0000 mL | Freq: Once | INTRAMUSCULAR | Status: AC | PRN
  Administered 2024-03-30: 46 mL

## 2024-03-30 MED ORDER — FENTANYL CITRATE (PF) 100 MCG/2ML IJ SOLN
INTRAMUSCULAR | Status: AC
  Filled 2024-03-30: qty 2

## 2024-03-30 MED ORDER — FENTANYL CITRATE (PF) 100 MCG/2ML IJ SOLN
INTRAMUSCULAR | Status: AC
Start: 2024-03-30 — End: 2024-03-30
  Filled 2024-03-30: qty 2

## 2024-03-30 MED ORDER — LIDOCAINE HCL 1 % IJ SOLN: 20.0000 mL | Freq: Once | INTRAMUSCULAR | Status: DC

## 2024-03-30 MED ORDER — FENTANYL CITRATE (PF) 100 MCG/2ML IJ SOLN
INTRAMUSCULAR | Status: AC | PRN
  Administered 2024-03-30: 50 ug via INTRAVENOUS

## 2024-03-30 MED ORDER — MIDAZOLAM HCL 2 MG/2ML IJ SOLN
INTRAMUSCULAR | Status: AC | PRN
Start: 2024-03-30 — End: 2024-03-30
  Administered 2024-03-30: .5 mg via INTRAVENOUS

## 2024-03-30 MED ORDER — MIDAZOLAM HCL 2 MG/2ML IJ SOLN
INTRAMUSCULAR | Status: AC | PRN
Start: 2024-03-30 — End: 2024-03-30
  Administered 2024-03-30: 1 mg via INTRAVENOUS

## 2024-03-30 MED ORDER — MIDAZOLAM HCL 2 MG/2ML IJ SOLN
INTRAMUSCULAR | Status: AC
  Filled 2024-03-30: qty 2

## 2024-03-30 MED ORDER — ENSURE PLUS HIGH PROTEIN PO LIQD: 237.0000 mL | Freq: Two times a day (BID) | ORAL | Status: DC

## 2024-03-30 MED ORDER — LIDOCAINE HCL 1 % IJ SOLN
INTRAMUSCULAR | Status: AC
  Filled 2024-03-30: qty 20

## 2024-03-30 NOTE — Plan of Care (Signed)

## 2024-03-30 NOTE — Sedation Documentation (Signed)
 Patient raising head to see what MD is doing.

## 2024-03-30 NOTE — Sedation Documentation (Signed)
 Patient is resting comfortably, snoring lightly.

## 2024-03-30 NOTE — Sedation Documentation (Signed)
 CELT vascular closure device deployed to right femoral artery by Dr Johann.

## 2024-03-30 NOTE — Sedation Documentation (Addendum)
 Patient raising head frequently to see what MD is doing.

## 2024-03-30 NOTE — Plan of Care (Signed)

## 2024-03-30 NOTE — Progress Notes (Signed)
   03/30/24 1607  TOC Brief Assessment  Insurance and Status Lapsed  Patient has primary care physician Yes Abe, Barnie NOVAK, MD)  Home environment has been reviewed Homeless  Prior level of function: Independent  Prior/Current Home Services No current home services  Social Drivers of Health Review SDOH reviewed needs interventions  Readmission risk has been reviewed Yes  Transition of care needs no transition of care needs at this time

## 2024-03-30 NOTE — Sedation Documentation (Signed)
 Rn Adib Wahba gave handoff to Nena Gasmen RN at [phone] no drugs were handed off.

## 2024-03-30 NOTE — Progress Notes (Signed)
 [facility]  PROGRESS NOTE  Antonio Alvarez FMW:[id number] DOB: [birth year] DOA: 03/29/2024 PCP: Vicci Barnie NOVAK, MD   Brief HPI:   43 y.o. male with medical history significant for tobacco abuse, alcohol abuse, associated chronic pancreatitis and gastritis being admitted to the hospital with recurrent abdominal pain and acute on chronic pancreatitis as well as gastritis.  History is provided by the patient, states he has been drinking alcohol on and off the last 10 days since he was discharged from the hospital.  Presented to the emergency department this afternoon with left lower quadrant abdominal pain, which she typically has when his pancreas is flaring up.  He also coughed up a blood clot this afternoon     Assessment/Plan:   Abdominal pain and nausea-possibly related to acute on chronic pancreatitis versus gastritis, potentially both.  He has not been compliant with proton pump inhibitor therapy, and has continued to drink alcohol. -Continue pantoprazole  -IV fluids -Continue n.p.o.   Acute on chronic pancreatitis-in the setting of known pancreatic pseudocyst, pancreatitis related to alcohol abuse.  Treat as above.    Alcohol abuse-patient states his last drink was 7/14, as documented during his previous hospitalization he is not very forthright about his drinking. -Thiamine , folate, multivitamin -P.o. 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 -Check treponemal antibody      Medications     folic acid   1 mg Oral Daily   multivitamin with minerals  1 tablet Oral Daily   pantoprazole  (PROTONIX ) IV  40 mg Intravenous Q24H   thiamine   100 mg Oral Daily   Or   thiamine   100 mg Intravenous Daily     Data Reviewed:    CBG:  No results for input(s): GLUCAP in the last 168 hours.  SpO2: 100 %    Vitals:   03/29/24 2009 03/29/24 2141 03/30/24 0123 03/30/24 0647  BP:  126/82 109/77 109/78  Pulse:  61 60 (!) 50  Resp:  18 18 18   Temp: 98 F (36.7 C) 98.5 F (36.9 C) 98.1 F (36.7 C) 98.2 F (36.8 C)  TempSrc: Oral Oral Oral Oral  SpO2:  98% 98% 100%  Weight:      Height:          Data Reviewed:  Basic Metabolic Panel: Recent Labs  Lab 03/29/24 1716 03/30/24 0617  NA 136 132*  K 3.9 3.7  CL 98 96*  CO2 25 27  GLUCOSE 125* 103*  BUN 13 9  CREATININE 0.61 0.77  CALCIUM 9.2 8.8*    CBC: Recent Labs  Lab 03/29/24 1716 03/30/24 0617  WBC 11.3* 9.1  NEUTROABS 8.5*  --   HGB 11.5* 10.0*  HCT 36.2* 31.3*  MCV 82.8 83.7  PLT 449* 359    LFT Recent Labs  Lab 03/29/24 1716  AST 29  ALT 19  ALKPHOS 93  BILITOT 0.6  PROT 8.3*  ALBUMIN 3.6     Antibiotics: Anti-infectives (From admission, onward)    None        DVT prophylaxis: SCDs  Code Status: Full code  Family Communication:    CONSULTS    Subjective   Still complains of epigastric pain   Objective    Physical Examination:   General-appears in no acute distress Heart-S1-S2,  regular, no murmur auscultated Lungs-clear to auscultation bilaterally, no wheezing or crackles auscultated Abdomen-soft, mild tenderness in epigastric region Extremities-no edema in the lower extremities Neuro-alert, oriented x3, no focal deficit noted   Status is: Inpatient:             Antonio Alvarez   [facility] If 7PM-7AM, please contact night-coverage at [email or web address], Office  [phone]   03/30/2024, 9:10 AM  LOS: 0 days

## 2024-03-30 NOTE — Consult Note (Addendum)
 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 male smoker with past medical history of alcohol abuse, anxiety, bipolar disorder, chronic back pain, depression, and chronic pancreatitis/gastritis with known pseudocyst who was admitted to [facility] on 7/15 with recurrent abdominal pain, some dyspnea with exertion, nausea and acute on chronic pancreatitis as well as gastritis.  Patient had also coughed up a blood clot afternoon of admission but none since that time.  Patient has not been taking any prescribed medications.  CT angio of the chest was negative.  CT abdomen pelvis revealed:  Persistent pseudocyst in the tail of the pancreas with peripancreatic, perigastric and perisplenic fluid again consistent with acute pancreatitis. Within the peripheral aspect of the pseudocyst, there is an area of enhancement which washes out on delayed images highly suspicious for pseudoaneurysm. This has increased in size when compared with the prior exam. Donor vessel is not well appreciated on this study  Latest labs include WBC normal, hemoglobin 10, platelets normal, creatinine normal, PT/INR normal; patient is RPR reactive with pending T pallidum Ab  Request now received from [facility] for further evaluation of possible abdominal pseudoaneurysm      Patient is Full Code  Past Medical History:  Diagnosis Date   Abscess    hx of abscess on neck   Alcohol abuse    Anxiety    Bipolar disorder (HCC)    Cellulitis    Chronic back pain    Depression    Hyponatremia    Insomnia    Leukocytosis    Pancreatitis     Past Surgical History:  Procedure Laterality Date    INCISION AND DRAINAGE ABSCESS  X 3   back, both sides of my neck    Allergies: Patient has no known allergies.  Medications: Prior to Admission medications   Medication Sig Start Date End Date Taking? Authorizing Provider  doxycycline  (VIBRAMYCIN ) 100 MG capsule Take 1 capsule (100 mg total) by mouth 2 (two) times daily. Patient not taking: Reported on 03/30/2024 03/26/24   Beverley Leita LABOR, PA-C  ferrous sulfate  325 (65 FE) MG tablet Take 1 tablet (325 mg total) by mouth daily with breakfast. Patient not taking: Reported on 03/30/2024 03/19/24 06/17/24  Vernon Ranks, MD  omeprazole  (PRILOSEC) 40 MG capsule Take 1 capsule (40 mg total) by mouth 2 (two) times daily for 28 days. Patient not taking: Reported on 03/30/2024 03/19/24 04/16/24  Vernon Ranks, MD     Family History  Problem Relation Age of Onset   Alcohol abuse Father     Social History   Socioeconomic History   Marital status: Single    Spouse name: Not on file   Number of children: 9   Years of education: 13   Highest education level: Not on file  Occupational History   Occupation: odd jobs  Tobacco Use   Smoking status: Every Day    Current packs/day: 0.50    Average packs/day: 0.5 packs/day for 21.0 years (10.5 ttl pk-yrs)    Types: Cigarettes   Smokeless tobacco: Never  Vaping Use   Vaping status: Never Used  Substance and Sexual Activity   Alcohol use: Yes    Alcohol/week: 49.0 standard drinks of alcohol    Types: 49  Cans of beer per week    Comment: 03/28/2016 average 7 beers/day   Drug use: Yes    Types: Marijuana    Comment: occasionally   Sexual activity: Yes  Other Topics Concern   Not on file  Social History Narrative   Not on file   Social Drivers of Health   Financial Resource Strain: Not on file  Food Insecurity: Food Insecurity Present (03/29/2024)   Hunger Vital Sign    Worried About Running Out of Food in the Last Year: Sometimes true    Ran Out of Food in the Last Year: Sometimes true   Transportation Needs: Unmet Transportation Needs (03/29/2024)   PRAPARE - Administrator, Civil Service (Medical): Yes    Lack of Transportation (Non-Medical): Yes  Physical Activity: Not on file  Stress: Not on file  Social Connections: Patient Declined (03/18/2024)   Social Connection and Isolation Panel    Frequency of Communication with Friends and Family: Patient declined    Frequency of Social Gatherings with Friends and Family: Patient declined    Attends Religious Services: Patient declined    Database administrator or Organizations: Patient declined    Attends Banker Meetings: Patient declined    Marital Status: Patient declined       Review of Systems see above: Currently denies fever, chest pain, vomiting or bleeding; he does complain of some mild lightheadedness, abdominal (primarily left-sided) and back pain, mild dyspnea, nausea, weight loss  Vital Signs: BP 119/77 (BP Location: Left Arm)   Pulse (!) 55   Temp 97.7 F (36.5 C) (Oral)   Resp 16   Ht 5' 10 (1.778 m)   Wt 149 lb 14.6 oz (68 kg)   SpO2 99%   BMI 21.51 kg/m   Advance Care Plan: No documents on file  Physical Exam awake, alert.  Chest with some slightly diminished breath sounds bases.  Heart with bradycardic but regular rhythm.  Abdomen soft, few bowel sounds, tender mid to left lat abdominal region with some radiation to flank; no lower extremity edema.  Known history of abscesses to right lateral chest wall and thigh as well as lesions on penis.  Imaging: CT ABDOMEN PELVIS W CONTRAST Result Date: 03/29/2024 CLINICAL DATA:  Acute abdominal pain and history of weight loss, initial encounter EXAM: CT ABDOMEN AND PELVIS WITH CONTRAST TECHNIQUE: Multidetector CT imaging of the abdomen and pelvis was performed using the standard protocol following bolus administration of intravenous contrast. RADIATION DOSE REDUCTION: This exam was performed according to the departmental  dose-optimization program which includes automated exposure control, adjustment of the mA and/or kV according to patient size and/or use of iterative reconstruction technique. CONTRAST:  OMNIPAQUE  IOHEXOL  350 MG/ML SOLN COMPARISON:  03/17/2024 FINDINGS: Hepatobiliary: No focal liver abnormality is seen. No gallstones, gallbladder wall thickening, or biliary dilatation. Pancreas: Pancreas demonstrates calcifications in the uncinate process as well as in the tail of the pancreas. These are stable in appearance from the prior exam. There is a persistent pseudocyst in the tail of the pancreas which measures 4.6 cm in greatest dimension. Area of increased attenuation is noted on the portal venous phase images along the lateral component which measures approximately 17 mm in greatest dimension. This is larger than that seen on the prior exam and demonstrates washout on delayed images. These findings raise suspicion for pseudoaneurysm related to the pseudocyst. Feeding vessel is not well visualized. Spleen: Normal in size without focal abnormality. Small amount of  perisplenic fluid is noted. Adrenals/Urinary Tract: Adrenal glands are within normal limits. Kidneys demonstrate a normal enhancement pattern bilaterally. No renal calculi or obstructive changes are seen. The bladder is well distended. Stomach/Bowel: No obstructive or inflammatory changes of the colon are seen. The appendix is within normal limits. No inflammatory changes are seen. Small bowel and stomach are within normal limits aside from extrinsic compression on the stomach by the pancreatic pseudocyst. The previously seen findings suspicious for communication between the cyst and stomach are less prominent than on the prior exam. Vascular/Lymphatic: No aortic abnormality is noted. Mild narrowing of the splenic vein is seen as it passes the pancreatic pseudocyst stable from the prior exam. Increased venous collaterals are noted from the spleen  surrounding the stomach. Reproductive: Prostate is unremarkable. Other: Some free fluid is noted in the upper abdomen surrounding the stomach and spleen. This may be related to some persistent changes of acute pancreatitis. Musculoskeletal: Degenerative changes of lumbar spine. No acute bony abnormality noted. 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 Within the peripheral aspect of the pseudocyst, there is an area of enhancement which washes out on delayed images highly suspicious for pseudoaneurysm. This has increased in size when compared with the prior exam. Donor vessel is not well appreciated on this study. Critical Value/emergent results were called by telephone at the time of interpretation on 03/29/2024 at 7:26 pm to provider [facility] , who verbally acknowledged these results. No enlarged abdominal or pelvic lymph nodes. Electronically Signed   By: Oneil Devonshire M.D.   On: 03/29/2024 19:29   CT Angio Chest PE W and/or Wo Contrast Result Date: 03/29/2024 CLINICAL DATA:  History of 10 pound weight loss and hemoptysis for 1 month, initial encounter EXAM: CT ANGIOGRAPHY CHEST WITH CONTRAST TECHNIQUE: Multidetector CT imaging of the chest was performed using the standard protocol during bolus administration of intravenous contrast. Multiplanar CT image reconstructions and MIPs were obtained to evaluate the vascular anatomy. RADIATION DOSE REDUCTION: This exam was performed according to the departmental dose-optimization program which includes automated exposure control, adjustment of the mA and/or kV according to patient size and/or use of iterative reconstruction technique. CONTRAST:  OMNIPAQUE  IOHEXOL  350 MG/ML SOLN COMPARISON:  03/17/2024 FINDINGS: Cardiovascular: Thoracic aorta shows no aneurysmal dilatation or dissection. No cardiac enlargement is seen. The pulmonary artery shows a  normal branching pattern bilaterally. No focal filling defect to suggest pulmonary embolism is noted. No coronary calcifications are seen. Mediastinum/Nodes: Thoracic inlet is within normal limits. No hilar or mediastinal adenopathy is noted. The esophagus as visualized is within normal limits. Lungs/Pleura: Lungs are well aerated bilaterally. No focal infiltrate or sizable effusion is seen. No parenchymal nodules are noted. Musculoskeletal: No acute rib abnormality is noted. Review of the MIP images confirms the above findings. IMPRESSION: No evidence of pulmonary emboli. No acute abnormality noted. Electronically Signed   By: Oneil Devonshire M.D.   On: 03/29/2024 19:14   DG Chest 2 View Result Date: 03/29/2024 EXAM: 2 VIEW(S) XRAY OF THE CHEST 03/29/2024 05:24:14 PM COMPARISON: 03/17/2024 CLINICAL HISTORY: [id number] Hemoptysis [id number]. Per chart: BIB EMS from home for coughing up blood for the last month that is worsening. Reports 10lb weight loss over the last month. FINDINGS: LUNGS AND PLEURA: No focal pulmonary opacity. No pulmonary edema. No pleural effusion. No pneumothorax. HEART AND MEDIASTINUM: No acute abnormality of the cardiac and mediastinal silhouettes. BONES AND SOFT TISSUES: No acute  osseous abnormality. IMPRESSION: 1. No acute process. Electronically signed by: Norman Gatlin MD 03/29/2024 05:26 PM EDT RP Workstation: [id number]   CT ABDOMEN PELVIS W CONTRAST Result Date: 03/17/2024 CLINICAL DATA:  Pain everywhere for several hours. Abdominal pain. Left shoulder, chest, EXAM: CT ABDOMEN AND PELVIS WITH CONTRAST TECHNIQUE: Multidetector CT imaging of the abdomen and pelvis was performed using the standard protocol following bolus administration of intravenous contrast. RADIATION DOSE REDUCTION: This exam was performed according to the departmental dose-optimization program which includes automated exposure control, adjustment of the mA and/or kV according to patient size and/or use of iterative  reconstruction technique. CONTRAST:  OMNIPAQUE  IOHEXOL  300 MG/ML  SOLN COMPARISON:  07/05/2017 FINDINGS: Lower chest: No acute abnormality. Hepatobiliary: No acute abnormality. Pancreas: Cystic lesion within the tail of the pancreas measuring 4.1 x 3.7 cm and containing a mural calcification. This likely represents a pseudocyst. The pseudocysts impresses upon the gastric antrum with possible communicating tract with the stomach lumen (circa series 7/image 59 and 2/51). Mural calcification within the cyst. No ductal dilation. Ductal calcification in the tail of the pancreas. There is an additional smaller cyst in the tail the pancreas measuring 1.4 cm (series 7/image 73). Free fluid about the gastric antrum, pancreatic tail, colon at the splenic flexure, and spleen in the left upper quadrant. Spleen: Unremarkable. Adrenals/Urinary Tract: Normal adrenal glands. No urinary calculi or hydronephrosis. Similar diffuse wall thickening of the bladder. Stomach/Bowel: Wall thickening about the gastric fundus. Wall thickening about the colon in the splenic flexure. These may be reactive secondary to pancreatitis. No bowel obstruction. Normal appendix. Vascular/Lymphatic: Portal vein is patent. There is severe narrowing of the splenic vein as it passes the pancreatic tail pseudocyst (series 7/image 70). Reproductive: No acute abnormality. Other: No organized fluid collection.  No free intraperitoneal air. Musculoskeletal: No acute fracture. IMPRESSION: 1. Acute pancreatitis with a 4.1 cm pseudocyst in the tail of the pancreas. The pseudocyst impresses upon the gastric antrum with possible communicating tract with the stomach lumen. 2. Wall thickening about the gastric fundus and colon in the splenic flexure, likely reactive secondary to pancreatitis. 3. Severe narrowing of the splenic vein as it passes the pancreatic tail pseudocyst. Electronically Signed   By: Norman Gatlin M.D.   On: 03/17/2024 19:58   DG Chest 2  View Result Date: 03/17/2024 CLINICAL DATA:  Chest pain. EXAM: CHEST - 2 VIEW COMPARISON:  06/13/2022 FINDINGS: The cardiomediastinal contours are normal. The lungs are clear. Pulmonary vasculature is normal. No consolidation, pleural effusion, or pneumothorax. Remote left rib fractures. No acute osseous abnormalities are seen. Slight scoliosis. IMPRESSION: No active cardiopulmonary disease. Electronically Signed   By: Andrea Gasman M.D.   On: 03/17/2024 16:22    Labs:  CBC: Recent Labs    03/18/24 0533 03/19/24 0539 03/29/24 1716 03/30/24 0617  WBC 9.7 6.0 11.3* 9.1  HGB 10.4* 10.1* 11.5* 10.0*  HCT 32.7* 32.1* 36.2* 31.3*  PLT 405* 398 449* 359    COAGS: Recent Labs    03/17/24 2134 03/29/24 1716  INR 1.0 1.0    BMP: Recent Labs    03/18/24 0533 03/19/24 0539 03/29/24 1716 03/30/24 0617  NA 136 139 136 132*  K 3.7 3.8 3.9 3.7  CL 102 103 98 96*  CO2 23 27 25 27   GLUCOSE 87 97 125* 103*  BUN 8 5* 13 9  CALCIUM 8.1* 8.5* 9.2 8.8*  CREATININE 0.94 0.74 0.61 0.77  GFRNONAA >60 >60 >60 >60    LIVER FUNCTION  TESTS: Recent Labs    03/17/24 1640 03/18/24 0533 03/19/24 0539 03/29/24 1716  BILITOT 0.8 0.7 0.5 0.6  AST 24 16 19 29   ALT 22 15 15 19   ALKPHOS 82 67 63 93  PROT 8.0 6.1* 6.5 8.3*  ALBUMIN 3.7 2.7* 2.8* 3.6    TUMOR MARKERS: No results for input(s): AFPTM, CEA, CA199, CHROMGRNA in the last 8760 hours.  Assessment and Plan: 43 y.o. male smoker with past medical history of alcohol abuse, anxiety, bipolar disorder, chronic back pain, depression, and chronic pancreatitis/gastritis with known pseudocyst who was admitted to [facility] on 7/15 with recurrent abdominal pain, some dyspnea with exertion, nausea and acute on chronic pancreatitis as well as gastritis.  Patient had also coughed up a blood clot afternoon of admission but none since that time.  Patient has not been taking any prescribed medications.  CT angio of the chest was  negative.  CT abdomen pelvis revealed:  Persistent pseudocyst in the tail of the pancreas with peripancreatic, perigastric and perisplenic fluid again consistent with acute pancreatitis. Within the peripheral aspect of the pseudocyst, there is an area of enhancement which washes out on delayed images highly suspicious for pseudoaneurysm. This has increased in size when compared with the prior exam. Donor vessel is not well appreciated on this study  Latest labs include WBC normal, hemoglobin 10, platelets normal, creatinine normal, PT/INR normal; patient is RPR reactive with pending T pallidum Ab  Request now received from [facility] for further evaluation of possible abdominal pseudoaneurysm.  Latest imaging studies have been reviewed by Dr. Johann.Risks and benefits of procedure were discussed with the patient including, but not limited to bleeding, infection, vascular injury or contrast induced renal failure.  This interventional procedure involves the use of X-rays and because of the nature of the planned procedure, it is possible that we will have prolonged use of X-ray fluoroscopy.  Potential radiation risks to you include (but are not limited to) the following: - A slightly elevated risk for cancer  several years later in life. This risk is typically less than 0.5% percent. This risk is low in comparison to the normal incidence of human cancer, which is 33% for women and 50% for men according to the American Cancer Society. - Radiation induced injury can include skin redness, resembling a rash, tissue breakdown / ulcers and hair loss (which can be temporary or permanent).   The likelihood of either of these occurring depends on the difficulty of the procedure and whether you are sensitive to radiation due to previous procedures, disease, or genetic conditions.   IF your procedure requires a prolonged use of radiation, you will be notified and given written instructions for further action.   It is your responsibility to monitor the irradiated area for the 2 weeks following the procedure and to notify your physician if you are concerned that you have suffered a radiation induced injury.    All of the patient's questions were answered, patient is agreeable to proceed.  Consent signed and in chart.  Procedure tentatively scheduled for this afternoon.    Thank you for allowing our service to participate in Antonio Alvarez 's care.  Electronically Signed: D. Franky Rakers, PA-C   03/30/2024, 1:03 PM      I spent a total of 40 minutes    in face to face in clinical consultation, greater than 50% of which was counseling/coordinating care for visceral/mesenteric arteriogram with possible coil embolization

## 2024-03-30 NOTE — Procedures (Signed)
  Procedure:  Spenic arteriogram, transarterial embolization (partial) of pseudoaneurysm   Preprocedure diagnosis: The encounter diagnosis was Acute pancreatitis, unspecified complication status, unspecified pancreatitis type. Postprocedure diagnosis: same EBL:    minimal Complications:   none immediate  See full dictation in [employer].  CHARM Toribio Faes MD Main # [phone] Pager  [phone] Mobile [phone]

## 2024-03-31 LAB — T.PALLIDUM AB, TOTAL: T Pallidum Abs: REACTIVE — AB

## 2024-03-31 MED ORDER — PANCRELIPASE (LIP-PROT-AMYL) 12000-38000 UNITS PO CPEP
12000.0000 [IU] | ORAL_CAPSULE | Freq: Three times a day (TID) | ORAL | Status: DC
  Administered 2024-03-31 – 2024-04-01 (×3): 12000 [IU] via ORAL
  Filled 2024-03-31 (×3): qty 1

## 2024-03-31 MED ORDER — PANTOPRAZOLE SODIUM 40 MG PO TBEC
40.0000 mg | DELAYED_RELEASE_TABLET | Freq: Every day | ORAL | Status: DC
  Administered 2024-04-01: 40 mg via ORAL
  Filled 2024-03-31: qty 1

## 2024-03-31 NOTE — Progress Notes (Cosign Needed)
 Referring Provider(s): Dr. Drusilla Mori, MD  Supervising Physician: Philip Cornet  Patient Status:  [name] - In-pt  Chief Complaint: Hemoptysis; chronic pancreatitis.  Brief History:  Antonio Alvarez is a 43 y.o. male smoker with past medical history of alcohol abuse, anxiety, bipolar disorder, chronic back pain, depression, and chronic pancreatitis/gastritis with known pseudocyst who was admitted to [facility] on 7/15 with recurrent abdominal pain, some dyspnea with exertion, nausea and acute on chronic pancreatitis as well as gastritis.  Patient had also coughed up a blood clot. CT angio of the chest was negative. CT abdomen pelvis revealed persistent pseudocyst in the tail of the pancreas with peripancreatic, perigastric and perisplenic fluid again consistent with acute pancreatitis. Patient underwent transarterial partial embolization of the pancreatic pseudocyst by Dr. Johann on 7/17.  Subjective:  Patient alert and laying in bed, calm.  Currently without any significant complaints. He endorses only mild LUQ discomfort, mainly after eating sweets. Overall, he feels improved. Patient denies any fevers, headache, chest pain, SOB, cough, nausea, vomiting or bleeding.     Allergies: Patient has no known allergies.  Medications: Prior to Admission medications   Medication Sig Start Date End Date Taking? Authorizing Provider  doxycycline  (VIBRAMYCIN ) 100 MG capsule Take 1 capsule (100 mg total) by mouth 2 (two) times daily. Patient not taking: Reported on 03/30/2024 03/26/24   Beverley Leita LABOR, PA-C  ferrous sulfate  325 (65 FE) MG tablet Take 1 tablet (325 mg total) by mouth daily with breakfast. Patient not taking: Reported on 03/30/2024 03/19/24 06/17/24  Vernon Ranks, MD  omeprazole  (PRILOSEC) 40 MG capsule Take 1 capsule (40 mg total) by mouth 2 (two) times daily for 28 days. Patient not taking: Reported on 03/30/2024 03/19/24 04/16/24  Vernon Ranks, MD     Vital Signs: BP 109/76  (BP Location: Right Arm)   Pulse (!) 56   Temp 98 F (36.7 C) (Oral)   Resp 16   Ht 5' 10 (1.778 m)   Wt 149 lb 14.6 oz (68 kg)   SpO2 99%   BMI 21.51 kg/m   Physical Exam Constitutional:      Appearance: Normal appearance.  Cardiovascular:     Rate and Rhythm: Normal rate.  Pulmonary:     Effort: Pulmonary effort is normal.  Abdominal:     General: Abdomen is flat.     Palpations: Abdomen is soft.     Tenderness: There is abdominal tenderness.     Comments: Mild LUQ and epigastric tenderness to palpation.  Musculoskeletal:        General: Normal range of motion.  Skin:    General: Skin is warm and dry.     Comments: Right femoral site minimally tender to palpation. Dressing was removed. No induration, swelling, bruits, ecchymosis, weeping, draining, nor bleeding noted.  Neurological:     Mental Status: He is alert and oriented to person, place, and time.      Labs:  CBC: Recent Labs    03/18/24 0533 03/19/24 0539 03/29/24 1716 03/30/24 0617  WBC 9.7 6.0 11.3* 9.1  HGB 10.4* 10.1* 11.5* 10.0*  HCT 32.7* 32.1* 36.2* 31.3*  PLT 405* 398 449* 359    COAGS: Recent Labs    03/17/24 2134 03/29/24 1716  INR 1.0 1.0    BMP: Recent Labs    03/18/24 0533 03/19/24 0539 03/29/24 1716 03/30/24 0617  NA 136 139 136 132*  K 3.7 3.8 3.9 3.7  CL 102 103 98 96*  CO2  23 27 25 27   GLUCOSE 87 97 125* 103*  BUN 8 5* 13 9  CALCIUM 8.1* 8.5* 9.2 8.8*  CREATININE 0.94 0.74 0.61 0.77  GFRNONAA >60 >60 >60 >60    LIVER FUNCTION TESTS: Recent Labs    03/17/24 1640 03/18/24 0533 03/19/24 0539 03/29/24 1716  BILITOT 0.8 0.7 0.5 0.6  AST 24 16 19 29   ALT 22 15 15 19   ALKPHOS 82 67 63 93  PROT 8.0 6.1* 6.5 8.3*  ALBUMIN 3.7 2.7* 2.8* 3.6    Assessment and Plan:  Patient is overall improved from initial presentation on admission. He is mildly tender over epigastric and LUQ quadrant. Femoral site with dressing removed, without concerns for aneurysm,  hematoma, bleeding, nor infection. Patient tolerating regular diet, though he questions if his pain is triggered by sweets. Continue management /plan per Care Team. Please contact IR with any concerns or questions.  IR will continue to follow.    Thank you for this interesting consult.  I greatly enjoyed meeting Antonio Alvarez and look forward to participating in their care.   Electronically Signed: Carlin DELENA Griffon, PA-C 03/31/2024, 3:46 PM     I spent a total of 15 Minutes at the the patient's bedside AND on the patient's hospital floor or unit, greater than 50% of which was counseling/coordinating care for pancreatic pseudocyst partial embolization.

## 2024-03-31 NOTE — Consult Note (Addendum)
 WOC Nurse Consult Note: Reason for Consult: Consult requested for right thigh.  Pt stated wound started as a white raised bump.  It has ruptured and drained mod amt pink fluid and is now a full thickness wound, 1X1X.3cm. He had similar wounds previously which healed.  Dressing procedure/placement/frequency: Topical treatment orders provided for bedside nurses to perform as follows: Cut small piece of Aquacel Soila # [phone]) and tuck over right thigh wound Q day, then cover with foam dressing.  Change foam dressing Q 3 days or PRN soiling. Moisten with NS to remove each time.  Please re-consult if further assistance is needed.  Thank-you,  Stephane Fought MSN, RN, CWOCN, CWCN-AP, CNS Contact Mon-Fri 0700-1500: [phone]

## 2024-03-31 NOTE — Progress Notes (Signed)
 [facility]  PROGRESS NOTE  Antonio Alvarez FMW:[id number] DOB: 03-23-1981 DOA: 03/29/2024 PCP: Vicci Barnie NOVAK, MD   Brief HPI:   43 y.o. male with medical history significant for tobacco abuse, alcohol abuse, associated chronic pancreatitis and gastritis being admitted to the hospital with recurrent abdominal pain and acute on chronic pancreatitis as well as gastritis.  History is provided by the patient, states he has been drinking alcohol on and off the last 10 days since he was discharged from the hospital.  Presented to the emergency department this afternoon with left lower quadrant abdominal pain, which she typically has when his pancreas is flaring up.  He also coughed up a blood clot this afternoon     Assessment/Plan:   Abdominal pain and nausea-possibly related to acute on chronic pancreatitis versus gastritis, potentially both.  He has not been compliant with proton pump inhibitor therapy, and has continued to drink alcohol. -Continue pantoprazole  -IV fluids -Continue n.p.o.   Acute on chronic pancreatitis-in the setting of known pancreatic pseudocyst, pancreatitis related to alcohol abuse.  Treat as above. -Will add Creon , pancreatic enzymes 3 times a day    Alcohol abuse-patient states his last drink was 7/14, as documented during his previous hospitalization he is not very forthright about his drinking. -Thiamine , folate, multivitamin -P.o. 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 -Check treponemal antibody      Medications     feeding supplement  237 mL Oral BID BM   folic acid   1 mg Oral Daily   lidocaine   20 mL Intradermal Once   multivitamin with minerals  1 tablet Oral Daily    pantoprazole  (PROTONIX ) IV  40 mg Intravenous Q24H   thiamine   100 mg Oral Daily   Or   thiamine   100 mg Intravenous Daily     Data Reviewed:   CBG:  No results for input(s): GLUCAP in the last 168 hours.  SpO2: 99 % O2 Flow Rate (L/min): 2 L/min    Vitals:   03/30/24 1755 03/30/24 1757 03/30/24 2150 03/31/24 0524  BP: (!) 113/95  (!) 100/59 109/76  Pulse: (!) 53 (!) 52 61 (!) 56  Resp:  (!) 8 14 16   Temp:   98.2 F (36.8 C) 98 F (36.7 C)  TempSrc:   Oral Oral  SpO2: 100% 99% 98% 99%  Weight:      Height:          Data Reviewed:  Basic Metabolic Panel: Recent Labs  Lab 03/29/24 1716 03/30/24 0617  NA 136 132*  K 3.9 3.7  CL 98 96*  CO2 25 27  GLUCOSE 125* 103*  BUN 13 9  CREATININE 0.61 0.77  CALCIUM 9.2 8.8*    CBC: Recent Labs  Lab 03/29/24 1716 03/30/24 0617  WBC 11.3* 9.1  NEUTROABS 8.5*  --   HGB 11.5* 10.0*  HCT 36.2* 31.3*  MCV 82.8 83.7  PLT 449* 359    LFT Recent Labs  Lab 03/29/24 1716  AST 29  ALT 19  ALKPHOS 93  BILITOT 0.6  PROT 8.3*  ALBUMIN 3.6     Antibiotics: Anti-infectives (From admission, onward)    None        DVT prophylaxis: SCDs  Code Status: Full code  Family Communication:  CONSULTS    Subjective   Underwent transarterial partial embolization of the pseudoaneurysm by Dr. Johann on 7/17.  Complains of epigastric pain.   Objective    Physical Examination:   General-appears in no acute distress Heart-S1-S2, regular, no murmur auscultated Lungs-clear to auscultation bilaterally, no wheezing or crackles auscultated Abdomen-soft, nontender, no organomegaly Extremities-no edema in the lower extremities Neuro-alert, oriented x3, no focal deficit noted   Status is: Inpatient:             Antonio Alvarez   [facility] If 7PM-7AM, please contact night-coverage at [email or web address], Office  [phone]   03/31/2024, 8:49 AM  LOS: 1 day

## 2024-03-31 NOTE — Plan of Care (Signed)

## 2024-04-01 MED ORDER — VITAMIN B-1 100 MG PO TABS: 100.0000 mg | ORAL_TABLET | Freq: Every day | ORAL | 0 refills | Status: AC

## 2024-04-01 MED ORDER — PANTOPRAZOLE SODIUM 40 MG PO TBEC: 40.0000 mg | DELAYED_RELEASE_TABLET | Freq: Every day | ORAL | 1 refills | Status: AC

## 2024-04-01 MED ORDER — DOXYCYCLINE HYCLATE 100 MG PO TABS
100.0000 mg | ORAL_TABLET | Freq: Two times a day (BID) | ORAL | 0 refills | Status: AC
Start: 2024-04-01 — End: 2024-04-29

## 2024-04-01 MED ORDER — OXYCODONE HCL 5 MG PO TABS: 5.0000 mg | ORAL_TABLET | Freq: Four times a day (QID) | ORAL | 0 refills | Status: DC | PRN

## 2024-04-01 MED ORDER — PANCRELIPASE (LIP-PROT-AMYL) 12000-38000 UNITS PO CPEP: 12000.0000 [IU] | ORAL_CAPSULE | Freq: Three times a day (TID) | ORAL | 1 refills | Status: AC

## 2024-04-01 NOTE — Discharge Summary (Signed)
 Physician Discharge Summary   Patient: Antonio Alvarez MRN: [id number] DOB: 05/06/1981  Admit date:     03/29/2024  Discharge date: 04/01/24  Discharge Physician: Sabas GORMAN Brod   PCP: Vicci Barnie NOVAK, MD   Recommendations at discharge:   Follow-up infectious disease clinic as outpatient for treatment of syphilis Follow-up PCP in 1 week  Discharge Diagnoses: Principal Problem:   Chronic pancreatitis due to chronic alcoholism (HCC) Active Problems:   Acute pancreatitis  Resolved Problems:   * No resolved hospital problems. *  Hospital Course: 43 y.o. male with medical history significant for tobacco abuse, alcohol abuse, associated chronic pancreatitis and gastritis being admitted to the hospital with recurrent abdominal pain and acute on chronic pancreatitis as well as gastritis.  History is provided by the patient, states he has been drinking alcohol on and off the last 10 days since he was discharged from the hospital.  Presented to the emergency department this afternoon with left lower quadrant abdominal pain, which she typically has when his pancreas is flaring up.  He also coughed up a blood clot this afternoon     Assessment and Plan:  Abdominal pain and nausea-possibly related to acute on chronic pancreatitis versus gastritis, potentially both.  He has not been compliant with proton pump inhibitor therapy, and has continued to drink alcohol. -Improved, continue pantoprazole     Acute on chronic pancreatitis-in the setting of known pancreatic pseudocyst, pancreatitis related to alcohol abuse.  Treat as above. -Will add Creon , pancreatic enzymes 3 times a day     Alcohol abuse-patient states his last drink was 7/14, as documented during his previous hospitalization he is not very forthright about his drinking. 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 Within the peripheral aspect of the pseudocyst, there is an area of enhancement which washes out on delayed images highly suspicious for pseudoaneurysm. This has increased in size when compared with the prior exam. 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 - treponemal antibody was reactive - Started on doxycycline  100 mg p.o. twice daily for 28 days - Follow-up infectious disease clinic as outpatient        Consultants: IR Procedures performed: Transarterial embolization of pseudoaneurysm Disposition: Home Diet recommendation:  Discharge Diet Orders (From admission, onward)     Start     Ordered   04/01/24 0000  Diet - low sodium heart healthy        04/01/24 1220           Regular diet DISCHARGE MEDICATION: Allergies as of 04/01/2024   No Known Allergies      Medication List     STOP taking these medications    doxycycline  100 MG capsule Commonly known as: VIBRAMYCIN  Replaced by: doxycycline  100 MG tablet   ferrous sulfate  325 (65 FE) MG tablet   omeprazole  40 MG capsule Commonly known as: PRILOSEC       TAKE these medications    doxycycline  100 MG tablet Commonly known as: VIBRA -TABS Take 1 tablet (100 mg total) by mouth 2 (two) times daily for 28  days. Replaces: doxycycline  100 MG capsule   lipase/protease/amylase 12000-38000 units Cpep capsule Commonly known as: CREON  Take 1 capsule (12,000 Units total) by mouth 3 (three) times daily with meals.   oxyCODONE  5 MG immediate release tablet Commonly known as: Oxy IR/ROXICODONE  Take 1 tablet (5 mg total) by mouth  every 6 (six) hours as needed for moderate pain (pain score 4-6).   pantoprazole  40 MG tablet Commonly known as: PROTONIX  Take 1 tablet (40 mg total) by mouth daily. Start taking on: April 02, 2024   thiamine  100 MG tablet Commonly known as: Vitamin B-1 Take 1 tablet (100 mg total) by mouth daily. Start taking on: April 02, 2024               Discharge Care Instructions  (From admission, onward)           Start     Ordered   04/01/24 0000  Discharge wound care:       Comments: Cut small piece of Aquacel  and tuck over right thigh wound Q day, then cover with foam dressing.  Change foam dressing every 3 days or as needed for soiling. Moisten with Saline to remove each time.   04/01/24 1220            Follow-up Information     Vu, Constance DASEN, MD. Schedule an appointment as soon as possible for a visit.   Specialty: Infectious Diseases Why: For folllow up for Syphilis Contact information: [address] [place] [address] [phone]                Discharge Exam: Fredricka Weights   03/29/24 1619  Weight: 68 kg   General-appears in no acute distress Heart-S1-S2, regular, no murmur auscultated Lungs-clear to auscultation bilaterally, no wheezing or crackles auscultated Abdomen-soft, nontender, no organomegaly Extremities-no edema in the lower extremities Neuro-alert, oriented x3, no focal deficit noted  Condition at discharge: good  The results of significant diagnostics from this hospitalization (including imaging, microbiology, ancillary and laboratory) are listed below for reference.   Imaging Studies: IR EMBO ART  VEN HEMORR LYMPH EXTRAV  INC GUIDE ROADMAPPING Result Date: 03/31/2024 CLINICAL DATA:  Acute pancreatitis with pseudocyst, enlarging enhancing focus consistent with pseudoaneurysm near the pancreatic tail/splenic hilum on serial CTs. EXAM: SELECTIVE VISCERAL ARTERIOGRAPHY; ADDITIONAL ARTERIOGRAPHY; IR ULTRASOUND GUIDANCE VASC  ACCESS RIGHT ANESTHESIA/SEDATION: Intravenous Fentanyl  250mcg and Versed  5mg  were administered by RN during a total moderate (conscious) sedation time of 174 minutes; the patient's level of consciousness and physiological / cardiorespiratory status were monitored continuously by radiology RN under my direct supervision. MEDICATIONS: Lidocaine  1% subcutaneous CONTRAST:  50mL OMNIPAQUE  IOHEXOL  300 MG/ML SOLN, 50mL OMNIPAQUE  IOHEXOL  300 MG/ML SOLN, 50mL OMNIPAQUE  IOHEXOL  300 MG/ML SOLN, 46mL OMNIPAQUE  IOHEXOL  300 MG/ML SOLN PROCEDURE: The procedure, risks, benefits, and alternatives were reviewed with the the patient. Questions regarding the procedure were encouraged and answered. The patient understands and consents to the procedure. The right femoral region was prepped with chlorhexidine in a sterile fashion, and a sterile drape was applied covering the operative field. A sterile gown and sterile gloves were used for the procedure. Local anesthesia was provided with 1% Lidocaine .  A After an appropriate skin entry site was confirmed under ultrasound, the right common femoral artery was accessed over the femoral head with a 21 gauge micropuncture set using single-wall technique in a single pass under real time ultrasound guidance. Ultrasound documentation was stored. Access exchanged over [facility] wire for a 5-French  vascular sheath was placed, through which a C2 Catheter was advanced. The Celiac axis was selectively catheterized for arteriography. Catheter advanced into the splenic artery for selective arteriography. Catheter redirected into the left gastric artery for additional selective arteriography. Catheter exchanged for a 5 [place] Mickelson catheter, through which microcatheter was advanced for selective arteriography of 2 third order splenic artery branches. Microcatheter was then advanced into the left gastric artery for additional selective arteriography. The Rendell was then advanced into the proximal  common hepatic artery for selective arteriography, demonstrating the right gastric artery. Catheter was then redirected into the celiac axis for additional arteriography. Microcatheter was then advanced again into splenic artery branches for selective arteriography. A third order distal branch feeding the pseudoaneurysm localized, and through the microcatheter 1- and 2-mm coils deployed for sub selective embolization. Follow-up arteriography was obtained. Potential made to catheterize additional feeding branches to the pseudoaneurysm but ultimately additional branches conclusively supplying the lesion could not be confirmed for selective embolization. The micro catheter, catheter and sheath were then removed and hemostasis achieved at the site using the Celt device under ultrasound guidance. The patient tolerated the procedure well. FLUOROSCOPY TIME:  Radiation Exposure Index (as provided by the fluoroscopic device): 1096 mGy air Kerma FINDINGS: Celiac arteriography demonstrates flow in the pseudoaneurysm near the splenic hilum. Selective splenic arteriogram confirms supply from splenic artery branches. Initially, proximal spasm resulted in decreased filling of the pseudoaneurysm from splenic artery approach. However, left gastric arteriography did not demonstrate supply to the lesion. Attention was then returned to the splenic artery and after the spasm had resolved, the microcatheter was advanced into a one branch supplying the pseudoaneurysm, allowing technically successful coil embolization. Subsequent arteriography demonstrated continued perfusion to the pseudoaneurysm from at least 1 additional splenic artery distal third order branch, but this could not be selectively localized and catheterized to allow sub selective embolization. COMPLICATIONS: None immediate IMPRESSION: 1. 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 2. Technically successful coil embolization of  1 supplying branch. Additional supplying branches evident but could not be selectively localized and catheterized to allow sub selective embolization. Electronically Signed   By: JONETTA Faes M.D.   On: 03/31/2024 16:30   IR Angiogram Visceral Selective Result Date: 03/31/2024 CLINICAL DATA:  Acute pancreatitis with pseudocyst, enlarging enhancing focus consistent with pseudoaneurysm near the pancreatic tail/splenic hilum on serial CTs. EXAM: SELECTIVE VISCERAL ARTERIOGRAPHY; ADDITIONAL ARTERIOGRAPHY; IR ULTRASOUND GUIDANCE VASC ACCESS RIGHT ANESTHESIA/SEDATION: Intravenous Fentanyl  250mcg and Versed  5mg  were administered by RN during a total moderate (conscious) sedation time of 174 minutes; the patient's level of consciousness and physiological / cardiorespiratory status were monitored continuously by radiology RN under my direct supervision. MEDICATIONS: Lidocaine  1% subcutaneous CONTRAST:  50mL OMNIPAQUE  IOHEXOL  300 MG/ML SOLN, 50mL OMNIPAQUE  IOHEXOL  300 MG/ML SOLN, 50mL OMNIPAQUE  IOHEXOL  300 MG/ML SOLN, 46mL OMNIPAQUE  IOHEXOL  300 MG/ML SOLN PROCEDURE: The procedure, risks, benefits, and alternatives were reviewed with the the patient. Questions regarding the procedure were encouraged and answered. The patient understands and consents to the procedure. The right femoral region was prepped with chlorhexidine in a sterile fashion, and a sterile drape was applied covering the operative field. A sterile gown and sterile gloves were used for the procedure. Local anesthesia was provided with 1% Lidocaine .  A After an appropriate skin entry site was confirmed under ultrasound, the right common femoral artery was accessed over the femoral head with a 21 gauge micropuncture set using single-wall technique in a single pass under  real time ultrasound guidance. Ultrasound documentation was stored. Access exchanged over [facility] wire for a 5-French vascular sheath was placed, through which a C2 Catheter was advanced. The  Celiac axis was selectively catheterized for arteriography. Catheter advanced into the splenic artery for selective arteriography. Catheter redirected into the left gastric artery for additional selective arteriography. Catheter exchanged for a 5 [place] Mickelson catheter, through which microcatheter was advanced for selective arteriography of 2 third order splenic artery branches. Microcatheter was then advanced into the left gastric artery for additional selective arteriography. The Rendell was then advanced into the proximal common hepatic artery for selective arteriography, demonstrating the right gastric artery. Catheter was then redirected into the celiac axis for additional arteriography. Microcatheter was then advanced again into splenic artery branches for selective arteriography. A third order distal branch feeding the pseudoaneurysm localized, and through the microcatheter 1- and 2-mm coils deployed for sub selective embolization. Follow-up arteriography was obtained. Potential made to catheterize additional feeding branches to the pseudoaneurysm but ultimately additional branches conclusively supplying the lesion could not be confirmed for selective embolization. The micro catheter, catheter and sheath were then removed and hemostasis achieved at the site using the Celt device under ultrasound guidance. The patient tolerated the procedure well. FLUOROSCOPY TIME:  Radiation Exposure Index (as provided by the fluoroscopic device): 1096 mGy air Kerma FINDINGS: Celiac arteriography demonstrates flow in the pseudoaneurysm near the splenic hilum. Selective splenic arteriogram confirms supply from splenic artery branches. Initially, proximal spasm resulted in decreased filling of the pseudoaneurysm from splenic artery approach. However, left gastric arteriography did not demonstrate supply to the lesion. Attention was then returned to the splenic artery and after the spasm had resolved, the microcatheter was  advanced into a one branch supplying the pseudoaneurysm, allowing technically successful coil embolization. Subsequent arteriography demonstrated continued perfusion to the pseudoaneurysm from at least 1 additional splenic artery distal third order branch, but this could not be selectively localized and catheterized to allow sub selective embolization. COMPLICATIONS: None immediate IMPRESSION: 1. 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 2. Technically successful coil embolization of 1 supplying branch. Additional supplying branches evident but could not be selectively localized and catheterized to allow sub selective embolization. Electronically Signed   By: JONETTA Faes M.D.   On: 03/31/2024 12:32   IR US  Guide Vasc Access Right Result Date: 03/31/2024 CLINICAL DATA:  Acute pancreatitis with pseudocyst, enlarging enhancing focus consistent with pseudoaneurysm near the pancreatic tail/splenic hilum on serial CTs. EXAM: SELECTIVE VISCERAL ARTERIOGRAPHY; ADDITIONAL ARTERIOGRAPHY; IR ULTRASOUND GUIDANCE VASC ACCESS RIGHT ANESTHESIA/SEDATION: Intravenous Fentanyl  250mcg and Versed  5mg  were administered by RN during a total moderate (conscious) sedation time of 174 minutes; the patient's level of consciousness and physiological / cardiorespiratory status were monitored continuously by radiology RN under my direct supervision. MEDICATIONS: Lidocaine  1% subcutaneous CONTRAST:  50mL OMNIPAQUE  IOHEXOL  300 MG/ML SOLN, 50mL OMNIPAQUE  IOHEXOL  300 MG/ML SOLN, 50mL OMNIPAQUE  IOHEXOL  300 MG/ML SOLN, 46mL OMNIPAQUE  IOHEXOL  300 MG/ML SOLN PROCEDURE: The procedure, risks, benefits, and alternatives were reviewed with the the patient. Questions regarding the procedure were encouraged and answered. The patient understands and consents to the procedure. The right femoral region was prepped with chlorhexidine in a sterile fashion, and a sterile drape was applied covering the operative  field. A sterile gown and sterile gloves were used for the procedure. Local anesthesia was provided with 1% Lidocaine .  A After an appropriate skin entry site was confirmed under ultrasound, the right common femoral artery was  accessed over the femoral head with a 21 gauge micropuncture set using single-wall technique in a single pass under real time ultrasound guidance. Ultrasound documentation was stored. Access exchanged over [facility] wire for a 5-French vascular sheath was placed, through which a C2 Catheter was advanced. The Celiac axis was selectively catheterized for arteriography. Catheter advanced into the splenic artery for selective arteriography. Catheter redirected into the left gastric artery for additional selective arteriography. Catheter exchanged for a 5 [place] Mickelson catheter, through which microcatheter was advanced for selective arteriography of 2 third order splenic artery branches. Microcatheter was then advanced into the left gastric artery for additional selective arteriography. The Rendell was then advanced into the proximal common hepatic artery for selective arteriography, demonstrating the right gastric artery. Catheter was then redirected into the celiac axis for additional arteriography. Microcatheter was then advanced again into splenic artery branches for selective arteriography. A third order distal branch feeding the pseudoaneurysm localized, and through the microcatheter 1- and 2-mm coils deployed for sub selective embolization. Follow-up arteriography was obtained. Potential made to catheterize additional feeding branches to the pseudoaneurysm but ultimately additional branches conclusively supplying the lesion could not be confirmed for selective embolization. The micro catheter, catheter and sheath were then removed and hemostasis achieved at the site using the Celt device under ultrasound guidance. The patient tolerated the procedure well. FLUOROSCOPY TIME:  Radiation  Exposure Index (as provided by the fluoroscopic device): 1096 mGy air Kerma FINDINGS: Celiac arteriography demonstrates flow in the pseudoaneurysm near the splenic hilum. Selective splenic arteriogram confirms supply from splenic artery branches. Initially, proximal spasm resulted in decreased filling of the pseudoaneurysm from splenic artery approach. However, left gastric arteriography did not demonstrate supply to the lesion. Attention was then returned to the splenic artery and after the spasm had resolved, the microcatheter was advanced into a one branch supplying the pseudoaneurysm, allowing technically successful coil embolization. Subsequent arteriography demonstrated continued perfusion to the pseudoaneurysm from at least 1 additional splenic artery distal third order branch, but this could not be selectively localized and catheterized to allow sub selective embolization. COMPLICATIONS: None immediate IMPRESSION: 1. 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 2. Technically successful coil embolization of 1 supplying branch. Additional supplying branches evident but could not be selectively localized and catheterized to allow sub selective embolization. Electronically Signed   By: JONETTA Faes M.D.   On: 03/31/2024 12:32   IR Angiogram Selective Each Additional Vessel Result Date: 03/31/2024 CLINICAL DATA:  Acute pancreatitis with pseudocyst, enlarging enhancing focus consistent with pseudoaneurysm near the pancreatic tail/splenic hilum on serial CTs. EXAM: SELECTIVE VISCERAL ARTERIOGRAPHY; ADDITIONAL ARTERIOGRAPHY; IR ULTRASOUND GUIDANCE VASC ACCESS RIGHT ANESTHESIA/SEDATION: Intravenous Fentanyl  250mcg and Versed  5mg  were administered by RN during a total moderate (conscious) sedation time of 174 minutes; the patient's level of consciousness and physiological / cardiorespiratory status were monitored continuously by radiology RN under my direct supervision.  MEDICATIONS: Lidocaine  1% subcutaneous CONTRAST:  50mL OMNIPAQUE  IOHEXOL  300 MG/ML SOLN, 50mL OMNIPAQUE  IOHEXOL  300 MG/ML SOLN, 50mL OMNIPAQUE  IOHEXOL  300 MG/ML SOLN, 46mL OMNIPAQUE  IOHEXOL  300 MG/ML SOLN PROCEDURE: The procedure, risks, benefits, and alternatives were reviewed with the the patient. Questions regarding the procedure were encouraged and answered. The patient understands and consents to the procedure. The right femoral region was prepped with chlorhexidine in a sterile fashion, and a sterile drape was applied covering the operative field. A sterile gown and sterile gloves were used for the procedure. Local anesthesia was provided with 1% Lidocaine .  A After an appropriate skin entry site was confirmed under ultrasound, the right common femoral artery was accessed over the femoral head with a 21 gauge micropuncture set using single-wall technique in a single pass under real time ultrasound guidance. Ultrasound documentation was stored. Access exchanged over [facility] wire for a 5-French vascular sheath was placed, through which a C2 Catheter was advanced. The Celiac axis was selectively catheterized for arteriography. Catheter advanced into the splenic artery for selective arteriography. Catheter redirected into the left gastric artery for additional selective arteriography. Catheter exchanged for a 5 [place] Mickelson catheter, through which microcatheter was advanced for selective arteriography of 2 third order splenic artery branches. Microcatheter was then advanced into the left gastric artery for additional selective arteriography. The Rendell was then advanced into the proximal common hepatic artery for selective arteriography, demonstrating the right gastric artery. Catheter was then redirected into the celiac axis for additional arteriography. Microcatheter was then advanced again into splenic artery branches for selective arteriography. A third order distal branch feeding the pseudoaneurysm  localized, and through the microcatheter 1- and 2-mm coils deployed for sub selective embolization. Follow-up arteriography was obtained. Potential made to catheterize additional feeding branches to the pseudoaneurysm but ultimately additional branches conclusively supplying the lesion could not be confirmed for selective embolization. The micro catheter, catheter and sheath were then removed and hemostasis achieved at the site using the Celt device under ultrasound guidance. The patient tolerated the procedure well. FLUOROSCOPY TIME:  Radiation Exposure Index (as provided by the fluoroscopic device): 1096 mGy air Kerma FINDINGS: Celiac arteriography demonstrates flow in the pseudoaneurysm near the splenic hilum. Selective splenic arteriogram confirms supply from splenic artery branches. Initially, proximal spasm resulted in decreased filling of the pseudoaneurysm from splenic artery approach. However, left gastric arteriography did not demonstrate supply to the lesion. Attention was then returned to the splenic artery and after the spasm had resolved, the microcatheter was advanced into a one branch supplying the pseudoaneurysm, allowing technically successful coil embolization. Subsequent arteriography demonstrated continued perfusion to the pseudoaneurysm from at least 1 additional splenic artery distal third order branch, but this could not be selectively localized and catheterized to allow sub selective embolization. COMPLICATIONS: None immediate IMPRESSION: 1. 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 2. Technically successful coil embolization of 1 supplying branch. Additional supplying branches evident but could not be selectively localized and catheterized to allow sub selective embolization. Electronically Signed   By: JONETTA Faes M.D.   On: 03/31/2024 12:32   IR Angiogram Selective Each Additional Vessel Result Date: 03/31/2024 CLINICAL DATA:  Acute  pancreatitis with pseudocyst, enlarging enhancing focus consistent with pseudoaneurysm near the pancreatic tail/splenic hilum on serial CTs. EXAM: SELECTIVE VISCERAL ARTERIOGRAPHY; ADDITIONAL ARTERIOGRAPHY; IR ULTRASOUND GUIDANCE VASC ACCESS RIGHT ANESTHESIA/SEDATION: Intravenous Fentanyl  250mcg and Versed  5mg  were administered by RN during a total moderate (conscious) sedation time of 174 minutes; the patient's level of consciousness and physiological / cardiorespiratory status were monitored continuously by radiology RN under my direct supervision. MEDICATIONS: Lidocaine  1% subcutaneous CONTRAST:  50mL OMNIPAQUE  IOHEXOL  300 MG/ML SOLN, 50mL OMNIPAQUE  IOHEXOL  300 MG/ML SOLN, 50mL OMNIPAQUE  IOHEXOL  300 MG/ML SOLN, 46mL OMNIPAQUE  IOHEXOL  300 MG/ML SOLN PROCEDURE: The procedure, risks, benefits, and alternatives were reviewed with the the patient. Questions regarding the procedure were encouraged and answered. The patient understands and consents to the procedure. The right femoral region was prepped with chlorhexidine in a sterile fashion, and a sterile drape was applied covering the operative field. A  sterile gown and sterile gloves were used for the procedure. Local anesthesia was provided with 1% Lidocaine .  A After an appropriate skin entry site was confirmed under ultrasound, the right common femoral artery was accessed over the femoral head with a 21 gauge micropuncture set using single-wall technique in a single pass under real time ultrasound guidance. Ultrasound documentation was stored. Access exchanged over [facility] wire for a 5-French vascular sheath was placed, through which a C2 Catheter was advanced. The Celiac axis was selectively catheterized for arteriography. Catheter advanced into the splenic artery for selective arteriography. Catheter redirected into the left gastric artery for additional selective arteriography. Catheter exchanged for a 5 [place] Mickelson catheter, through which microcatheter was  advanced for selective arteriography of 2 third order splenic artery branches. Microcatheter was then advanced into the left gastric artery for additional selective arteriography. The Rendell was then advanced into the proximal common hepatic artery for selective arteriography, demonstrating the right gastric artery. Catheter was then redirected into the celiac axis for additional arteriography. Microcatheter was then advanced again into splenic artery branches for selective arteriography. A third order distal branch feeding the pseudoaneurysm localized, and through the microcatheter 1- and 2-mm coils deployed for sub selective embolization. Follow-up arteriography was obtained. Potential made to catheterize additional feeding branches to the pseudoaneurysm but ultimately additional branches conclusively supplying the lesion could not be confirmed for selective embolization. The micro catheter, catheter and sheath were then removed and hemostasis achieved at the site using the Celt device under ultrasound guidance. The patient tolerated the procedure well. FLUOROSCOPY TIME:  Radiation Exposure Index (as provided by the fluoroscopic device): 1096 mGy air Kerma FINDINGS: Celiac arteriography demonstrates flow in the pseudoaneurysm near the splenic hilum. Selective splenic arteriogram confirms supply from splenic artery branches. Initially, proximal spasm resulted in decreased filling of the pseudoaneurysm from splenic artery approach. However, left gastric arteriography did not demonstrate supply to the lesion. Attention was then returned to the splenic artery and after the spasm had resolved, the microcatheter was advanced into a one branch supplying the pseudoaneurysm, allowing technically successful coil embolization. Subsequent arteriography demonstrated continued perfusion to the pseudoaneurysm from at least 1 additional splenic artery distal third order branch, but this could not be selectively localized and  catheterized to allow sub selective embolization. COMPLICATIONS: None immediate IMPRESSION: 1. 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 2. Technically successful coil embolization of 1 supplying branch. Additional supplying branches evident but could not be selectively localized and catheterized to allow sub selective embolization. Electronically Signed   By: JONETTA Faes M.D.   On: 03/31/2024 12:32   IR Angiogram Selective Each Additional Vessel Result Date: 03/31/2024 CLINICAL DATA:  Acute pancreatitis with pseudocyst, enlarging enhancing focus consistent with pseudoaneurysm near the pancreatic tail/splenic hilum on serial CTs. EXAM: SELECTIVE VISCERAL ARTERIOGRAPHY; ADDITIONAL ARTERIOGRAPHY; IR ULTRASOUND GUIDANCE VASC ACCESS RIGHT ANESTHESIA/SEDATION: Intravenous Fentanyl  250mcg and Versed  5mg  were administered by RN during a total moderate (conscious) sedation time of 174 minutes; the patient's level of consciousness and physiological / cardiorespiratory status were monitored continuously by radiology RN under my direct supervision. MEDICATIONS: Lidocaine  1% subcutaneous CONTRAST:  50mL OMNIPAQUE  IOHEXOL  300 MG/ML SOLN, 50mL OMNIPAQUE  IOHEXOL  300 MG/ML SOLN, 50mL OMNIPAQUE  IOHEXOL  300 MG/ML SOLN, 46mL OMNIPAQUE  IOHEXOL  300 MG/ML SOLN PROCEDURE: The procedure, risks, benefits, and alternatives were reviewed with the the patient. Questions regarding the procedure were encouraged and answered. The patient understands and consents to the procedure. The right femoral region was  prepped with chlorhexidine in a sterile fashion, and a sterile drape was applied covering the operative field. A sterile gown and sterile gloves were used for the procedure. Local anesthesia was provided with 1% Lidocaine .  A After an appropriate skin entry site was confirmed under ultrasound, the right common femoral artery was accessed over the femoral head with a 21 gauge micropuncture set  using single-wall technique in a single pass under real time ultrasound guidance. Ultrasound documentation was stored. Access exchanged over [facility] wire for a 5-French vascular sheath was placed, through which a C2 Catheter was advanced. The Celiac axis was selectively catheterized for arteriography. Catheter advanced into the splenic artery for selective arteriography. Catheter redirected into the left gastric artery for additional selective arteriography. Catheter exchanged for a 5 [place] Mickelson catheter, through which microcatheter was advanced for selective arteriography of 2 third order splenic artery branches. Microcatheter was then advanced into the left gastric artery for additional selective arteriography. The Rendell was then advanced into the proximal common hepatic artery for selective arteriography, demonstrating the right gastric artery. Catheter was then redirected into the celiac axis for additional arteriography. Microcatheter was then advanced again into splenic artery branches for selective arteriography. A third order distal branch feeding the pseudoaneurysm localized, and through the microcatheter 1- and 2-mm coils deployed for sub selective embolization. Follow-up arteriography was obtained. Potential made to catheterize additional feeding branches to the pseudoaneurysm but ultimately additional branches conclusively supplying the lesion could not be confirmed for selective embolization. The micro catheter, catheter and sheath were then removed and hemostasis achieved at the site using the Celt device under ultrasound guidance. The patient tolerated the procedure well. FLUOROSCOPY TIME:  Radiation Exposure Index (as provided by the fluoroscopic device): 1096 mGy air Kerma FINDINGS: Celiac arteriography demonstrates flow in the pseudoaneurysm near the splenic hilum. Selective splenic arteriogram confirms supply from splenic artery branches. Initially, proximal spasm resulted in decreased  filling of the pseudoaneurysm from splenic artery approach. However, left gastric arteriography did not demonstrate supply to the lesion. Attention was then returned to the splenic artery and after the spasm had resolved, the microcatheter was advanced into a one branch supplying the pseudoaneurysm, allowing technically successful coil embolization. Subsequent arteriography demonstrated continued perfusion to the pseudoaneurysm from at least 1 additional splenic artery distal third order branch, but this could not be selectively localized and catheterized to allow sub selective embolization. COMPLICATIONS: None immediate IMPRESSION: 1. 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 2. Technically successful coil embolization of 1 supplying branch. Additional supplying branches evident but could not be selectively localized and catheterized to allow sub selective embolization. Electronically Signed   By: JONETTA Faes M.D.   On: 03/31/2024 12:32   IR Angiogram Selective Each Additional Vessel Result Date: 03/31/2024 CLINICAL DATA:  Acute pancreatitis with pseudocyst, enlarging enhancing focus consistent with pseudoaneurysm near the pancreatic tail/splenic hilum on serial CTs. EXAM: SELECTIVE VISCERAL ARTERIOGRAPHY; ADDITIONAL ARTERIOGRAPHY; IR ULTRASOUND GUIDANCE VASC ACCESS RIGHT ANESTHESIA/SEDATION: Intravenous Fentanyl  250mcg and Versed  5mg  were administered by RN during a total moderate (conscious) sedation time of 174 minutes; the patient's level of consciousness and physiological / cardiorespiratory status were monitored continuously by radiology RN under my direct supervision. MEDICATIONS: Lidocaine  1% subcutaneous CONTRAST:  50mL OMNIPAQUE  IOHEXOL  300 MG/ML SOLN, 50mL OMNIPAQUE  IOHEXOL  300 MG/ML SOLN, 50mL OMNIPAQUE  IOHEXOL  300 MG/ML SOLN, 46mL OMNIPAQUE  IOHEXOL  300 MG/ML SOLN PROCEDURE: The procedure, risks, benefits, and alternatives were reviewed with the the  patient. Questions regarding  the procedure were encouraged and answered. The patient understands and consents to the procedure. The right femoral region was prepped with chlorhexidine in a sterile fashion, and a sterile drape was applied covering the operative field. A sterile gown and sterile gloves were used for the procedure. Local anesthesia was provided with 1% Lidocaine .  A After an appropriate skin entry site was confirmed under ultrasound, the right common femoral artery was accessed over the femoral head with a 21 gauge micropuncture set using single-wall technique in a single pass under real time ultrasound guidance. Ultrasound documentation was stored. Access exchanged over [facility] wire for a 5-French vascular sheath was placed, through which a C2 Catheter was advanced. The Celiac axis was selectively catheterized for arteriography. Catheter advanced into the splenic artery for selective arteriography. Catheter redirected into the left gastric artery for additional selective arteriography. Catheter exchanged for a 5 [place] Mickelson catheter, through which microcatheter was advanced for selective arteriography of 2 third order splenic artery branches. Microcatheter was then advanced into the left gastric artery for additional selective arteriography. The Rendell was then advanced into the proximal common hepatic artery for selective arteriography, demonstrating the right gastric artery. Catheter was then redirected into the celiac axis for additional arteriography. Microcatheter was then advanced again into splenic artery branches for selective arteriography. A third order distal branch feeding the pseudoaneurysm localized, and through the microcatheter 1- and 2-mm coils deployed for sub selective embolization. Follow-up arteriography was obtained. Potential made to catheterize additional feeding branches to the pseudoaneurysm but ultimately additional branches conclusively supplying the lesion could not  be confirmed for selective embolization. The micro catheter, catheter and sheath were then removed and hemostasis achieved at the site using the Celt device under ultrasound guidance. The patient tolerated the procedure well. FLUOROSCOPY TIME:  Radiation Exposure Index (as provided by the fluoroscopic device): 1096 mGy air Kerma FINDINGS: Celiac arteriography demonstrates flow in the pseudoaneurysm near the splenic hilum. Selective splenic arteriogram confirms supply from splenic artery branches. Initially, proximal spasm resulted in decreased filling of the pseudoaneurysm from splenic artery approach. However, left gastric arteriography did not demonstrate supply to the lesion. Attention was then returned to the splenic artery and after the spasm had resolved, the microcatheter was advanced into a one branch supplying the pseudoaneurysm, allowing technically successful coil embolization. Subsequent arteriography demonstrated continued perfusion to the pseudoaneurysm from at least 1 additional splenic artery distal third order branch, but this could not be selectively localized and catheterized to allow sub selective embolization. COMPLICATIONS: None immediate IMPRESSION: 1. 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 2. Technically successful coil embolization of 1 supplying branch. Additional supplying branches evident but could not be selectively localized and catheterized to allow sub selective embolization. Electronically Signed   By: JONETTA Faes M.D.   On: 03/31/2024 12:32   CT ABDOMEN PELVIS W CONTRAST Result Date: 03/29/2024 CLINICAL DATA:  Acute abdominal pain and history of weight loss, initial encounter EXAM: CT ABDOMEN AND PELVIS WITH CONTRAST TECHNIQUE: Multidetector CT imaging of the abdomen and pelvis was performed using the standard protocol following bolus administration of intravenous contrast. RADIATION DOSE REDUCTION: This exam was performed  according to the departmental dose-optimization program which includes automated exposure control, adjustment of the mA and/or kV according to patient size and/or use of iterative reconstruction technique. CONTRAST:  OMNIPAQUE  IOHEXOL  350 MG/ML SOLN COMPARISON:  03/17/2024 FINDINGS: Hepatobiliary: No focal liver abnormality is seen. No gallstones, gallbladder wall thickening, or biliary dilatation.  Pancreas: Pancreas demonstrates calcifications in the uncinate process as well as in the tail of the pancreas. These are stable in appearance from the prior exam. There is a persistent pseudocyst in the tail of the pancreas which measures 4.6 cm in greatest dimension. Area of increased attenuation is noted on the portal venous phase images along the lateral component which measures approximately 17 mm in greatest dimension. This is larger than that seen on the prior exam and demonstrates washout on delayed images. These findings raise suspicion for pseudoaneurysm related to the pseudocyst. Feeding vessel is not well visualized. Spleen: Normal in size without focal abnormality. Small amount of perisplenic fluid is noted. Adrenals/Urinary Tract: Adrenal glands are within normal limits. Kidneys demonstrate a normal enhancement pattern bilaterally. No renal calculi or obstructive changes are seen. The bladder is well distended. Stomach/Bowel: No obstructive or inflammatory changes of the colon are seen. The appendix is within normal limits. No inflammatory changes are seen. Small bowel and stomach are within normal limits aside from extrinsic compression on the stomach by the pancreatic pseudocyst. The previously seen findings suspicious for communication between the cyst and stomach are less prominent than on the prior exam. Vascular/Lymphatic: No aortic abnormality is noted. Mild narrowing of the splenic vein is seen as it passes the pancreatic pseudocyst stable from the prior exam. Increased venous collaterals are  noted from the spleen surrounding the stomach. Reproductive: Prostate is unremarkable. Other: Some free fluid is noted in the upper abdomen surrounding the stomach and spleen. This may be related to some persistent changes of acute pancreatitis. Musculoskeletal: Degenerative changes of lumbar spine. No acute bony abnormality noted. 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 Within the peripheral aspect of the pseudocyst, there is an area of enhancement which washes out on delayed images highly suspicious for pseudoaneurysm. This has increased in size when compared with the prior exam. Donor vessel is not well appreciated on this study. Critical Value/emergent results were called by telephone at the time of interpretation on 03/29/2024 at 7:26 pm to provider [facility] , who verbally acknowledged these results. No enlarged abdominal or pelvic lymph nodes. Electronically Signed   By: Oneil Devonshire M.D.   On: 03/29/2024 19:29   CT Angio Chest PE W and/or Wo Contrast Result Date: 03/29/2024 CLINICAL DATA:  History of 10 pound weight loss and hemoptysis for 1 month, initial encounter EXAM: CT ANGIOGRAPHY CHEST WITH CONTRAST TECHNIQUE: Multidetector CT imaging of the chest was performed using the standard protocol during bolus administration of intravenous contrast. Multiplanar CT image reconstructions and MIPs were obtained to evaluate the vascular anatomy. RADIATION DOSE REDUCTION: This exam was performed according to the departmental dose-optimization program which includes automated exposure control, adjustment of the mA and/or kV according to patient size and/or use of iterative reconstruction technique. CONTRAST:  OMNIPAQUE  IOHEXOL  350 MG/ML SOLN COMPARISON:  03/17/2024 FINDINGS: Cardiovascular: Thoracic aorta shows no aneurysmal dilatation or dissection. No cardiac enlargement is seen. The  pulmonary artery shows a normal branching pattern bilaterally. No focal filling defect to suggest pulmonary embolism is noted. No coronary calcifications are seen. Mediastinum/Nodes: Thoracic inlet is within normal limits. No hilar or mediastinal adenopathy is noted. The esophagus as visualized is within normal limits. Lungs/Pleura: Lungs are well aerated bilaterally. No focal infiltrate or sizable effusion is seen. No parenchymal nodules are noted. Musculoskeletal: No acute rib abnormality is noted. Review of the MIP images confirms the above  findings. IMPRESSION: No evidence of pulmonary emboli. No acute abnormality noted. Electronically Signed   By: Oneil Devonshire M.D.   On: 03/29/2024 19:14   DG Chest 2 View Result Date: 03/29/2024 EXAM: 2 VIEW(S) XRAY OF THE CHEST 03/29/2024 05:24:14 PM COMPARISON: 03/17/2024 CLINICAL HISTORY: [id number] Hemoptysis [id number]. Per chart: BIB EMS from home for coughing up blood for the last month that is worsening. Reports 10lb weight loss over the last month. FINDINGS: LUNGS AND PLEURA: No focal pulmonary opacity. No pulmonary edema. No pleural effusion. No pneumothorax. HEART AND MEDIASTINUM: No acute abnormality of the cardiac and mediastinal silhouettes. BONES AND SOFT TISSUES: No acute osseous abnormality. IMPRESSION: 1. No acute process. Electronically signed by: Norman Gatlin MD 03/29/2024 05:26 PM EDT RP Workstation: [id number]   CT ABDOMEN PELVIS W CONTRAST Result Date: 03/17/2024 CLINICAL DATA:  Pain everywhere for several hours. Abdominal pain. Left shoulder, chest, EXAM: CT ABDOMEN AND PELVIS WITH CONTRAST TECHNIQUE: Multidetector CT imaging of the abdomen and pelvis was performed using the standard protocol following bolus administration of intravenous contrast. RADIATION DOSE REDUCTION: This exam was performed according to the departmental dose-optimization program which includes automated exposure control, adjustment of the mA and/or kV according to patient size and/or  use of iterative reconstruction technique. CONTRAST:  OMNIPAQUE  IOHEXOL  300 MG/ML  SOLN COMPARISON:  07/05/2017 FINDINGS: Lower chest: No acute abnormality. Hepatobiliary: No acute abnormality. Pancreas: Cystic lesion within the tail of the pancreas measuring 4.1 x 3.7 cm and containing a mural calcification. This likely represents a pseudocyst. The pseudocysts impresses upon the gastric antrum with possible communicating tract with the stomach lumen (circa series 7/image 59 and 2/51). Mural calcification within the cyst. No ductal dilation. Ductal calcification in the tail of the pancreas. There is an additional smaller cyst in the tail the pancreas measuring 1.4 cm (series 7/image 73). Free fluid about the gastric antrum, pancreatic tail, colon at the splenic flexure, and spleen in the left upper quadrant. Spleen: Unremarkable. Adrenals/Urinary Tract: Normal adrenal glands. No urinary calculi or hydronephrosis. Similar diffuse wall thickening of the bladder. Stomach/Bowel: Wall thickening about the gastric fundus. Wall thickening about the colon in the splenic flexure. These may be reactive secondary to pancreatitis. No bowel obstruction. Normal appendix. Vascular/Lymphatic: Portal vein is patent. There is severe narrowing of the splenic vein as it passes the pancreatic tail pseudocyst (series 7/image 70). Reproductive: No acute abnormality. Other: No organized fluid collection.  No free intraperitoneal air. Musculoskeletal: No acute fracture. IMPRESSION: 1. Acute pancreatitis with a 4.1 cm pseudocyst in the tail of the pancreas. The pseudocyst impresses upon the gastric antrum with possible communicating tract with the stomach lumen. 2. Wall thickening about the gastric fundus and colon in the splenic flexure, likely reactive secondary to pancreatitis. 3. Severe narrowing of the splenic vein as it passes the pancreatic tail pseudocyst. Electronically Signed   By: Norman Gatlin M.D.   On: 03/17/2024 19:58    DG Chest 2 View Result Date: 03/17/2024 CLINICAL DATA:  Chest pain. EXAM: CHEST - 2 VIEW COMPARISON:  06/13/2022 FINDINGS: The cardiomediastinal contours are normal. The lungs are clear. Pulmonary vasculature is normal. No consolidation, pleural effusion, or pneumothorax. Remote left rib fractures. No acute osseous abnormalities are seen. Slight scoliosis. IMPRESSION: No active cardiopulmonary disease. Electronically Signed   By: Andrea Gasman M.D.   On: 03/17/2024 16:22    Microbiology: Results for orders placed or performed during the hospital encounter of 07/05/17  Urine Culture     Status: None  Collection Time: 07/05/17  6:39 PM   Specimen: Urine, Clean Catch  Result Value Ref Range Status   Specimen Description URINE, CLEAN CATCH  Final   Special Requests NONE  Final   Culture   Final    NO GROWTH Performed at [facility], [address] [place], [place] [address]    Report Status 07/07/2017 FINAL  Final    Labs: CBC: Recent Labs  Lab 03/29/24 1716 03/30/24 0617  WBC 11.3* 9.1  NEUTROABS 8.5*  --   HGB 11.5* 10.0*  HCT 36.2* 31.3*  MCV 82.8 83.7  PLT 449* 359   Basic Metabolic Panel: Recent Labs  Lab 03/29/24 1716 03/30/24 0617  NA 136 132*  K 3.9 3.7  CL 98 96*  CO2 25 27  GLUCOSE 125* 103*  BUN 13 9  CREATININE 0.61 0.77  CALCIUM 9.2 8.8*   Liver Function Tests: Recent Labs  Lab 03/29/24 1716  AST 29  ALT 19  ALKPHOS 93  BILITOT 0.6  PROT 8.3*  ALBUMIN 3.6   CBG: No results for input(s): GLUCAP in the last 168 hours.  Discharge time spent: greater than 30 minutes.  Signed: Sabas GORMAN Brod, MD [facility] 04/01/2024

## 2024-04-01 NOTE — Progress Notes (Incomplete)
 [facility]  PROGRESS NOTE  LINKEN MCGLOTHEN FMW:[id number] DOB: 04/03/1981 DOA: 03/29/2024 PCP: Vicci Barnie NOVAK, MD   Brief HPI:   43 y.o. male with medical history significant for tobacco abuse, alcohol abuse, associated chronic pancreatitis and gastritis being admitted to the hospital with recurrent abdominal pain and acute on chronic pancreatitis as well as gastritis.  History is provided by the patient, states he has been drinking alcohol on and off the last 10 days since he was discharged from the hospital.  Presented to the emergency department this afternoon with left lower quadrant abdominal pain, which she typically has when his pancreas is flaring up.  He also coughed up a blood clot this afternoon     Assessment/Plan:   Abdominal pain and nausea-possibly related to acute on chronic pancreatitis versus gastritis, potentially both.  He has not been compliant with proton pump inhibitor therapy, and has continued to drink alcohol. -Continue pantoprazole  -IV fluids -Continue n.p.o.   Acute on chronic pancreatitis-in the setting of known pancreatic pseudocyst, pancreatitis related to alcohol abuse.  Treat as above. -Will add Creon , pancreatic enzymes 3 times a day    Alcohol abuse-patient states his last drink was 7/14, as documented during his previous hospitalization he is not very forthright about his drinking. -Thiamine , folate, multivitamin -P.o. 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 -Check treponemal antibody      Medications     feeding supplement  237 mL Oral BID BM   folic acid   1 mg Oral Daily   lidocaine   20 mL Intradermal Once   lipase/protease/amylase  12,000 Units Oral TID WC    multivitamin with minerals  1 tablet Oral Daily   pantoprazole   40 mg Oral Daily   thiamine   100 mg Oral Daily   Or   thiamine   100 mg Intravenous Daily     Data Reviewed:   CBG:  No results for input(s): GLUCAP in the last 168 hours.  SpO2: 100 % O2 Flow Rate (L/min): 2 L/min    Vitals:   03/30/24 2150 03/31/24 0524 03/31/24 1933 04/01/24 0411  BP: (!) 100/59 109/76 98/74 126/86  Pulse: 61 (!) 56 70 66  Resp: 14 16 17 16   Temp: 98.2 F (36.8 C) 98 F (36.7 C) 98 F (36.7 C) 98 F (36.7 C)  TempSrc: Oral Oral Oral Oral  SpO2: 98% 99% 98% 100%  Weight:      Height:          Data Reviewed:  Basic Metabolic Panel: Recent Labs  Lab 03/29/24 1716 03/30/24 0617  NA 136 132*  K 3.9 3.7  CL 98 96*  CO2 25 27  GLUCOSE 125* 103*  BUN 13 9  CREATININE 0.61 0.77  CALCIUM 9.2 8.8*    CBC: Recent Labs  Lab 03/29/24 1716 03/30/24 0617  WBC 11.3* 9.1  NEUTROABS 8.5*  --   HGB 11.5* 10.0*  HCT 36.2* 31.3*  MCV 82.8 83.7  PLT 449* 359    LFT Recent Labs  Lab 03/29/24 1716  AST 29  ALT 19  ALKPHOS 93  BILITOT 0.6  PROT 8.3*  ALBUMIN 3.6     Antibiotics: Anti-infectives (From admission, onward)    None        DVT prophylaxis:  SCDs  Code Status: Full code  Family Communication:    CONSULTS    Subjective      Objective    Physical Examination:      Status is: Inpatient:             Sabas GORMAN Brod   [facility] If 7PM-7AM, please contact night-coverage at [email or web address], Office  [phone]   04/01/2024, 8:31 AM  LOS: 2 days

## 2024-04-04 ENCOUNTER — Telehealth: Payer: Self-pay

## 2024-04-04 NOTE — Transitions of Care (Post Inpatient/ED Visit) (Signed)
   04/04/2024  Name: Antonio Alvarez MRN: [id number] DOB: 08-18-1981  Today's TOC FU Call Status: Today's TOC FU Call Status:: Unsuccessful Call (1st Attempt) Unsuccessful Call (1st Attempt) Date: 04/04/24  Attempted to reach the patient regarding the most recent Inpatient/ED visit.  Follow Up Plan: Additional outreach attempts will be made to reach the patient to complete the Transitions of Care (Post Inpatient/ED visit) call.   Dr Vicci is listed as PCP but he has not seen her since 01/2017  Signature  Slater Diesel, RN

## 2024-04-05 ENCOUNTER — Telehealth: Payer: Self-pay

## 2024-04-05 NOTE — Transitions of Care (Post Inpatient/ED Visit) (Signed)
   04/05/2024  Name: Antonio Alvarez MRN: [id number] DOB: 1981/04/07  Today's TOC FU Call Status: Today's TOC FU Call Status:: Unsuccessful Call (2nd Attempt) Unsuccessful Call (1st Attempt) Date: 04/04/24 Unsuccessful Call (2nd Attempt) Date: 04/05/24  Attempted to reach the patient regarding the most recent Inpatient/ED visit.  Follow Up Plan: No further outreach attempts will be made at this time. We have been unable to contact the patient.   I spoke to patient's mother and she does not have a number for him. She said she would tell him to call me if she hears from him.  She also asked that her number be removed from his record.  She said she has been receiving multiple calls from [facility].  Dr Vicci is listed as PCP but the patient has not seen her since 01/2017  Signature Slater Diesel, RN

## 2024-04-07 ENCOUNTER — Emergency Department (HOSPITAL_COMMUNITY)
Admission: EM | Admit: 2024-04-07 | Discharge: 2024-04-08 | Disposition: A | Discharge status: Home / Self Care | Attending: Emergency Medicine | Admitting: Emergency Medicine

## 2024-04-07 ENCOUNTER — Encounter (HOSPITAL_COMMUNITY): Payer: Self-pay

## 2024-04-07 ENCOUNTER — Emergency Department (HOSPITAL_COMMUNITY)

## 2024-04-07 DIAGNOSIS — F1090 Alcohol use, unspecified, uncomplicated: Secondary | ICD-10-CM | POA: Insufficient documentation

## 2024-04-07 DIAGNOSIS — F339 Major depressive disorder, recurrent, unspecified: Secondary | ICD-10-CM | POA: Diagnosis not present

## 2024-04-07 DIAGNOSIS — Z79899 Other long term (current) drug therapy: Secondary | ICD-10-CM | POA: Insufficient documentation

## 2024-04-07 DIAGNOSIS — F341 Dysthymic disorder: Secondary | ICD-10-CM | POA: Insufficient documentation

## 2024-04-07 DIAGNOSIS — R45851 Suicidal ideations: Secondary | ICD-10-CM | POA: Insufficient documentation

## 2024-04-07 DIAGNOSIS — F1010 Alcohol abuse, uncomplicated: Secondary | ICD-10-CM | POA: Diagnosis present

## 2024-04-07 DIAGNOSIS — F29 Unspecified psychosis not due to a substance or known physiological condition: Secondary | ICD-10-CM | POA: Diagnosis not present

## 2024-04-07 DIAGNOSIS — K920 Hematemesis: Secondary | ICD-10-CM | POA: Insufficient documentation

## 2024-04-07 DIAGNOSIS — F17200 Nicotine dependence, unspecified, uncomplicated: Secondary | ICD-10-CM | POA: Insufficient documentation

## 2024-04-07 DIAGNOSIS — W01198A Fall on same level from slipping, tripping and stumbling with subsequent striking against other object, initial encounter: Secondary | ICD-10-CM | POA: Insufficient documentation

## 2024-04-07 DIAGNOSIS — R109 Unspecified abdominal pain: Secondary | ICD-10-CM | POA: Diagnosis not present

## 2024-04-07 DIAGNOSIS — S0990XA Unspecified injury of head, initial encounter: Secondary | ICD-10-CM | POA: Insufficient documentation

## 2024-04-07 LAB — COMPREHENSIVE METABOLIC PANEL WITH GFR
ALT: 21 U/L (ref 0–44)
AST: 33 U/L (ref 15–41)
Albumin: 3.8 g/dL (ref 3.5–5.0)
Alkaline Phosphatase: 91 U/L (ref 38–126)
Anion gap: 9 (ref 5–15)
BUN: 16 mg/dL (ref 6–20)
CO2: 27 mmol/L (ref 22–32)
Calcium: 9.1 mg/dL (ref 8.9–10.3)
Chloride: 101 mmol/L (ref 98–111)
Creatinine, Ser: 0.44 mg/dL — ABNORMAL LOW (ref 0.61–1.24)
GFR, Estimated: >60 mL/min (ref >60)
Glucose, Bld: 104 mg/dL — ABNORMAL HIGH (ref 70–99)
Potassium: 3.9 mmol/L (ref 3.5–5.1)
Sodium: 137 mmol/L (ref 135–145)
Total Bilirubin: 0.7 mg/dL (ref 0.0–1.2)
Total Protein: 8.6 g/dL — ABNORMAL HIGH (ref 6.5–8.1)

## 2024-04-07 LAB — CBC
HCT: 32.1 % — ABNORMAL LOW (ref 39.0–52.0)
Hemoglobin: 10.1 g/dL — ABNORMAL LOW (ref 13.0–17.0)
MCH: 26 pg (ref 26.0–34.0)
MCHC: 31.5 g/dL (ref 30.0–36.0)
MCV: 82.5 fL (ref 80.0–100.0)
Platelets: 598 K/uL — ABNORMAL HIGH (ref 150–400)
RBC: 3.89 MIL/uL — ABNORMAL LOW (ref 4.22–5.81)
RDW: 17.1 % — ABNORMAL HIGH (ref 11.5–15.5)
WBC: 8.8 K/uL (ref 4.0–10.5)
nRBC: 0 % (ref 0.0–0.2)

## 2024-04-07 LAB — URINALYSIS, ROUTINE W REFLEX MICROSCOPIC
Bilirubin Urine: NEGATIVE
Glucose, UA: NEGATIVE mg/dL
Hgb urine dipstick: NEGATIVE
Ketones, ur: NEGATIVE mg/dL
Leukocytes,Ua: NEGATIVE
Nitrite: NEGATIVE
Protein, ur: NEGATIVE mg/dL
Specific Gravity, Urine: 1.018 (ref 1.005–1.030)
pH: 7 (ref 5.0–8.0)

## 2024-04-07 LAB — LIPASE, BLOOD: Lipase: 111 U/L — ABNORMAL HIGH (ref 11–51)

## 2024-04-07 MED ORDER — LIDOCAINE VISCOUS HCL 2 % MT SOLN
15.0000 mL | Freq: Once | OROMUCOSAL | Status: AC
  Administered 2024-04-07: 15 mL via ORAL
  Filled 2024-04-07: qty 15

## 2024-04-07 MED ORDER — PANTOPRAZOLE SODIUM 40 MG PO TBEC
40.0000 mg | DELAYED_RELEASE_TABLET | Freq: Two times a day (BID) | ORAL | Status: DC
  Administered 2024-04-07 – 2024-04-08 (×2): 40 mg via ORAL
  Filled 2024-04-07 (×2): qty 1

## 2024-04-07 MED ORDER — ALUM & MAG HYDROXIDE-SIMETH 200-200-20 MG/5ML PO SUSP
30.0000 mL | Freq: Once | ORAL | Status: AC
  Administered 2024-04-07: 30 mL via ORAL
  Filled 2024-04-07: qty 30

## 2024-04-07 NOTE — ED Notes (Signed)
 Since the patient has been in [place], he has been calm and co-operative. He is currently resting peacefully without signs of distress.

## 2024-04-07 NOTE — ED Triage Notes (Addendum)
 Arrived POV, patient reports right sided abdominal pain 7/10 and that he has been vomiting dark brown blood X2 today and vomited blood clots last week. Patient states he has seen several different doctors and been told it's either gastritis, ulcer, or pancreatitis. Patient also states he hit his head on Saturday when he fell, and reports intermittent left temporal pain 8/10. NAD, steady gait.

## 2024-04-07 NOTE — ED Provider Triage Note (Signed)
 Emergency Medicine Provider Triage Evaluation Note  Antonio Alvarez , a 43 y.o. male  was evaluated in triage.  Pt complains of mechanical fall 2 days ago. Hit head and felt out of it since, notes left sided HA. No focal weakness. Also notes NV and abdominal pain for 2 months.   Review of Systems  Positive: NV, HA Negative: Fever  Physical Exam  BP (!) 132/100 (BP Location: Left Arm)   Pulse 83   Temp 98.6 F (37 C) (Oral)   Resp 16   Ht 5' 10 (1.778 m)   Wt 61.2 kg   SpO2 100%   BMI 19.37 kg/m  Gen:   Awake, no distress   Resp:  Normal effort  MSK:   Moves extremities without difficulty  Other:    Medical Decision Making  Medically screening exam initiated at 1:28 PM.  Appropriate orders placed.  Antonio Alvarez was informed that the remainder of the evaluation will be completed by another provider, this initial triage assessment does not replace that evaluation, and the importance of remaining in the ED until their evaluation is complete.     Antonio Warren SAILOR, PA-C 04/07/24 1328

## 2024-04-07 NOTE — ED Provider Notes (Signed)
 [place] EMERGENCY DEPARTMENT AT [facility] Provider Note   CSN: [id number] Arrival date & time: 04/07/24  1234     Patient presents with: Hematemesis and Head Injury   Antonio Alvarez is a 43 y.o. male with past medical history significant for paranoid schizophrenia, tobacco abuse, alcohol abuse, cocaine dependence, homelessness who presents with concern for abdominal pain, some vomiting of dark brown blood.  Additionally reports that he was drunk and fell down and hit his head.  On my evaluation he reports that he is feeling somewhat improved from the pain that he initially had, but is requesting detox so that he can be placed in a facility.  He is reporting some passive suicidal ideation.  He is requesting that we allow him to detox and that he can be placed.  He endorses continuous alcohol, drug use.    Head Injury      Prior to Admission medications   Medication Sig Start Date End Date Taking? Authorizing Provider  doxycycline  (VIBRA -TABS) 100 MG tablet Take 1 tablet (100 mg total) by mouth 2 (two) times daily for 28 days. Patient not taking: Reported on 04/07/2024 04/01/24 04/29/24  Drusilla Sabas RAMAN, MD  lipase/protease/amylase (CREON ) 12000-38000 units CPEP capsule Take 1 capsule (12,000 Units total) by mouth 3 (three) times daily with meals. Patient not taking: Reported on 04/07/2024 04/01/24   Drusilla Sabas RAMAN, MD  oxyCODONE  (OXY IR/ROXICODONE ) 5 MG immediate release tablet Take 1 tablet (5 mg total) by mouth every 6 (six) hours as needed for moderate pain (pain score 4-6). Patient not taking: Reported on 04/07/2024 04/01/24   Drusilla Sabas RAMAN, MD  pantoprazole  (PROTONIX ) 40 MG tablet Take 1 tablet (40 mg total) by mouth daily. Patient not taking: Reported on 04/07/2024 04/02/24   Drusilla Sabas RAMAN, MD  thiamine  (VITAMIN B-1) 100 MG tablet Take 1 tablet (100 mg total) by mouth daily. Patient not taking: Reported on 04/07/2024 04/02/24   Drusilla Sabas RAMAN, MD    Allergies: Patient has no  known allergies.    Review of Systems  All other systems reviewed and are negative.   Updated Vital Signs BP (!) 132/100 (BP Location: Left Arm)   Pulse 83   Temp 98.6 F (37 C) (Oral)   Resp 16   Ht 5' 10 (1.778 m)   Wt 61.2 kg   SpO2 100%   BMI 19.37 kg/m   Physical Exam Vitals and nursing note reviewed.  Constitutional:      General: He is not in acute distress.    Appearance: Normal appearance.  HENT:     Head: Normocephalic and atraumatic.  Eyes:     General:        Right eye: No discharge.        Left eye: No discharge.  Cardiovascular:     Rate and Rhythm: Normal rate and regular rhythm.     Heart sounds: No murmur heard.    No friction rub. No gallop.  Pulmonary:     Effort: Pulmonary effort is normal.     Breath sounds: Normal breath sounds.  Abdominal:     General: Bowel sounds are normal.     Palpations: Abdomen is soft.     Comments: Moderate ttp in epigastric region  Skin:    General: Skin is warm and dry.     Capillary Refill: Capillary refill takes less than 2 seconds.  Neurological:     Mental Status: He is alert and oriented to person, place,  and time.  Psychiatric:        Mood and Affect: Mood normal.        Behavior: Behavior normal.     (all labs ordered are listed, but only abnormal results are displayed) Labs Reviewed  COMPREHENSIVE METABOLIC PANEL WITH GFR - Abnormal; Notable for the following components:      Result Value   Glucose, Bld 104 (*)    Creatinine, Ser 0.44 (*)    Total Protein 8.6 (*)    All other components within normal limits  CBC - Abnormal; Notable for the following components:   RBC 3.89 (*)    Hemoglobin 10.1 (*)    HCT 32.1 (*)    RDW 17.1 (*)    Platelets 598 (*)    All other components within normal limits  LIPASE, BLOOD - Abnormal; Notable for the following components:   Lipase 111 (*)    All other components within normal limits  URINALYSIS, ROUTINE W REFLEX MICROSCOPIC     EKG: None  Radiology: CT Head Wo Contrast Result Date: 04/07/2024 CLINICAL DATA:  Head injury EXAM: CT HEAD WITHOUT CONTRAST TECHNIQUE: Contiguous axial images were obtained from the base of the skull through the vertex without intravenous contrast. RADIATION DOSE REDUCTION: This exam was performed according to the departmental dose-optimization program which includes automated exposure control, adjustment of the mA and/or kV according to patient size and/or use of iterative reconstruction technique. COMPARISON:  None Available. FINDINGS: Brain: No acute intracranial abnormality. Specifically, no hemorrhage, hydrocephalus, mass lesion, acute infarction, or significant intracranial injury. Vascular: No hyperdense vessel or unexpected calcification. Skull: No acute calvarial abnormality. Sinuses/Orbits: No acute findings Other: None IMPRESSION: No acute intracranial abnormality. Electronically Signed   By: Franky Crease M.D.   On: 04/07/2024 15:07     Procedures   Medications Ordered in the ED  pantoprazole  (PROTONIX ) EC tablet 40 mg (has no administration in time range)  alum & mag hydroxide-simeth (MAALOX/MYLANTA) 200-200-20 MG/5ML suspension 30 mL (30 mLs Oral Given 04/07/24 1816)    And  lidocaine  (XYLOCAINE ) 2 % viscous mouth solution 15 mL (15 mLs Oral Given 04/07/24 1818)    Clinical Course as of 04/07/24 1908  Thu Apr 07, 2024  1903 Medically clear for TTS eval [CP]    Clinical Course User Index [CP] Rosan Sherlean DEL, PA-C                                 Medical Decision Making Amount and/or Complexity of Data Reviewed Labs: ordered.   This patient is a 43 y.o. male  who presents to the ED for concern of abdominal pain, head injury, SI.   Differential diagnoses prior to evaluation: The emergent differential diagnosis includes, but is not limited to,  The causes of generalized abdominal pain include but are not limited to AAA, mesenteric ischemia, appendicitis,  diverticulitis, DKA, gastritis, gastroenteritis, AMI, nephrolithiasis, pancreatitis, peritonitis, adrenal insufficiency,lead poisoning, iron toxicity, intestinal ischemia, constipation, UTI,SBO/LBO, splenic rupture, biliary disease, IBD, IBS, PUD, or hepatitis, epidural hematoma, subdural hematoma, skull fracture, subarachnoid hemorrhage, unstable cervical spine fracture, concussion vs other MSK injury,  . This is not an exhaustive differential.   Past Medical History / Co-morbidities / Social History: paranoid schizophrenia, tobacco abuse, alcohol abuse, cocaine dependence, homelessness  Additional history: Chart reviewed. Pertinent results include: Reviewed lab work, imaging from previous emergency department visits, notably he was admitted just earlier this month for acute pancreatitis  Physical Exam: Physical exam performed. The pertinent findings include: Some diffuse tenderness to palpation epigastric region, otherwise he is overall well-appearing, does not seem to be in fulminant withdrawal, somewhat hypertensive, blood pressure 132/100, but not tachycardic, not febrile.  Normal mentation throughout.  Lab Tests/Imaging studies: I personally interpreted labs/imaging and the pertinent results include: UA unremarkable, CMP overall unremarkable, lipase elevated at 111, not 3 times upper limit of normal.  CBC with some anemia, hemoglobin 10.1 which is stable compared to his baseline, his platelets are elevated at 598.  CT head without contrast with no evidence of acute intracranial abnormality..I agree with the radiologist interpretation.   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  I have reviewed the patients home medicines and have made adjustments as needed.   Disposition: After consideration of the diagnostic results and the patients response to treatment, I feel that patient is currently medically cleared, he is  endorsing some suicidal ideation and staying to speak to social work team, psych team, think reasonable to have him evaluated given his SI, he is medically cleared for TTS eval at this time..    Final diagnoses:  None    ED Discharge Orders     None          Rosan Sherlean VEAR DEVONNA 04/07/24 1908    Patt Alm Macho, MD 04/07/24 2229

## 2024-04-07 NOTE — BH Assessment (Addendum)
 This patient has been referred to [facility] telecare for his teleassessment.  Coordinator for [facility] will reach out with a time and provider to see patient.

## 2024-04-07 NOTE — Consult Note (Signed)
 [facility] Consult Note  Patient Name: Antonio Alvarez MRN: [id number] DOB: 17-Jan-1981 DATE OF Consult: 04/07/2024  PRIMARY PSYCHIATRIC DIAGNOSES  1.  Alcohol use disorder 2.  dysthymia 3.    RECOMMENDATIONS  Recommendations: Medication recommendations: Patient is not interested in psychiatric medications Non-Medication/therapeutic recommendations: Patient would like to go to a Detox program in [place]  Is inpatient psychiatric hospitalization recommended for this patient? No (Explain why): patient is not actively suicidal, no intent or plan, does not want medications, just wants detox Follow-Up Telepsychiatry C/L services: We will sign off for now. Please re-consult our service if needed for any concerning changes in the patient's condition, discharge planning, or questions. Communication: Treatment team members (and family members if applicable) who were involved in treatment/care discussions and planning, and with whom we spoke or engaged with via secure text/chat, include the following: Christian Prosperi PA-c  Thank you for involving us  in the care of this patient. If you have any additional questions or concerns, please call [phone] and ask for me or the provider on-call.  TELEPSYCHIATRY ATTESTATION & CONSENT  As the provider for this telehealth consult, I attest that I verified the patient's identity using two separate identifiers, introduced myself to the patient, provided my credentials, disclosed my location, and performed this encounter via a HIPAA-compliant, real-time, face-to-face, two-way, interactive audio and video platform and with the full consent and agreement of the patient (or guardian as applicable.)  Patient physical location: [facility] ED. Telehealth provider physical location: home office in state of [place] .  Video start time: 2000 (Central Time) Video end time: 2010 (Central Time)  IDENTIFYING DATA  Antonio Alvarez is a 43 y.o. year-old male for whom a  psychiatric consultation has been ordered by the primary provider. The patient was identified using two separate identifiers.  CHIEF COMPLAINT/REASON FOR CONSULT  depression  HISTORY OF PRESENT ILLNESS (HPI)  The patient presented to the ED with complaints of gi bleeding and gastrointestinal problems secondary to his drinking. He verbalized that he wanted to detox. When he was told the the ED does not do detox he then said he was suicidal. On exam, the patient stated, yeah I'm suicidal, I'm always suicidal but I just want to detox. I'm not planning to kill myself. H is a poor historian. He talks about a program in [place]  that will come pick people up for detox at their program.  He wants to go to that program but he said that they told him he needed to get admitted.  He's not sure if that meant medically admitted or psychiatrically admitted and he's not real clear on what exactly they were trying to tell them.  I suspect it may have been needing a medical clearance before he could show up but he did not really understand what they were telling him.  He does not meet criteria for a medical admission and he does not meet criteria for psychiatric admission.  I spoke with the ED provider and he was not sure what exactly the patient wants.  I did look at the website and it appears that this facility in [place]  does do 24 seven pickups and they do take [place]  insurance.  I suggested having social work give them a call and see if they are legitimate and what do they need to have the patient do so that they can come pick him up.  PAST PSYCHIATRIC HISTORY  Patient states he was in the psych hospital once but he didn't like it He  tried medicines once or twice over the last 30 years and did not like those either He has been to rehab before for his alcohol use and has found that helpful He is currently focused on getting himself to a detox Otherwise as per HPI above.  PAST MEDICAL HISTORY  Past Medical  History:  Diagnosis Date   Abscess    hx of abscess on neck   Alcohol abuse    Anxiety    Bipolar disorder (HCC)    Cellulitis    Chronic back pain    Depression    Hyponatremia    Insomnia    Leukocytosis    Pancreatitis      HOME MEDICATIONS  Facility Ordered Medications  Medication   [COMPLETED] alum & mag hydroxide-simeth (MAALOX/MYLANTA) 200-200-20 MG/5ML suspension 30 mL   And   [COMPLETED] lidocaine  (XYLOCAINE ) 2 % viscous mouth solution 15 mL   pantoprazole  (PROTONIX ) EC tablet 40 mg   PTA Medications  Medication Sig   thiamine  (VITAMIN B-1) 100 MG tablet Take 1 tablet (100 mg total) by mouth daily. (Patient not taking: Reported on 04/07/2024)   pantoprazole  (PROTONIX ) 40 MG tablet Take 1 tablet (40 mg total) by mouth daily. (Patient not taking: Reported on 04/07/2024)   oxyCODONE  (OXY IR/ROXICODONE ) 5 MG immediate release tablet Take 1 tablet (5 mg total) by mouth every 6 (six) hours as needed for moderate pain (pain score 4-6). (Patient not taking: Reported on 04/07/2024)   lipase/protease/amylase (CREON ) 12000-38000 units CPEP capsule Take 1 capsule (12,000 Units total) by mouth 3 (three) times daily with meals. (Patient not taking: Reported on 04/07/2024)   doxycycline  (VIBRA -TABS) 100 MG tablet Take 1 tablet (100 mg total) by mouth 2 (two) times daily for 28 days. (Patient not taking: Reported on 04/07/2024)     ALLERGIES  No Known Allergies  SOCIAL & SUBSTANCE USE HISTORY  Social History   Socioeconomic History   Marital status: Single    Spouse name: Not on file   Number of children: 9   Years of education: 12   Highest education level: Not on file  Occupational History   Occupation: odd jobs  Tobacco Use   Smoking status: Every Day    Current packs/day: 0.50    Average packs/day: 0.5 packs/day for 21.0 years (10.5 ttl pk-yrs)    Types: Cigarettes   Smokeless tobacco: Never  Vaping Use   Vaping status: Never Used  Substance and Sexual Activity   Alcohol  use: Yes    Alcohol/week: 49.0 standard drinks of alcohol    Types: 49 Cans of beer per week    Comment: 03/28/2016 average 7 beers/day   Drug use: Yes    Types: Marijuana    Comment: occasionally   Sexual activity: Yes  Other Topics Concern   Not on file  Social History Narrative   Not on file   Social Drivers of Health   Financial Resource Strain: Not on file  Food Insecurity: Food Insecurity Present (03/29/2024)   Hunger Vital Sign    Worried About Running Out of Food in the Last Year: Sometimes true    Ran Out of Food in the Last Year: Sometimes true  Transportation Needs: Unmet Transportation Needs (03/29/2024)   PRAPARE - Administrator, Civil Service (Medical): Yes    Lack of Transportation (Non-Medical): Yes  Physical Activity: Not on file  Stress: Not on file  Social Connections: Patient Declined (03/18/2024)   Social Connection and Isolation Panel  Frequency of Communication with Friends and Family: Patient declined    Frequency of Social Gatherings with Friends and Family: Patient declined    Attends Religious Services: Patient declined    Database administrator or Organizations: Patient declined    Attends Engineer, structural: Patient declined    Marital Status: Patient declined   Social History   Tobacco Use  Smoking Status Every Day   Current packs/day: 0.50   Average packs/day: 0.5 packs/day for 21.0 years (10.5 ttl pk-yrs)   Types: Cigarettes  Smokeless Tobacco Never   Social History   Substance and Sexual Activity  Alcohol Use Yes   Alcohol/week: 49.0 standard drinks of alcohol   Types: 49 Cans of beer per week   Comment: 03/28/2016 average 7 beers/day   Social History   Substance and Sexual Activity  Drug Use Yes   Types: Marijuana   Comment: occasionally    Additional pertinent information .  FAMILY HISTORY  Family History  Problem Relation Age of Onset   Alcohol abuse Father    Family Psychiatric History (if  known):  alcoholism on both mother and father sides of the family.  He does think that the mental health problems are on his dad's side  MENTAL STATUS EXAM (MSE)  Mental Status Exam: General Appearance: Casual  Orientation:  Full (Time, Place, and Person)  Memory:  Recent;   Poor Remote;   Poor  Concentration:  Concentration: Poor  Recall:  NA  Attention  Poor  Eye Contact:  Fair  Speech:  Slow  Language:  Fair  Volume:  Normal  Mood: euthymic  Affect:  Constricted  Thought Process:  Goal Directed  Thought Content:  Negative  Suicidal Thoughts:  patient states he's been si for years but ain't never goin' to act onit. It does not concern him. He just wants to detox.  Homicidal Thoughts:  No  Judgement:  Impaired  Insight:  Shallow  Psychomotor Activity:  Normal  Akathisia:  No  Fund of Knowledge:  Fair    Assets:  Desire for Improvement Others:  future oriented  Cognition:  Impaired,  Mild  ADL's:  Intact  AIMS (if indicated):       VITALS  Blood pressure (!) 132/100, pulse 83, temperature 98.6 F (37 C), temperature source Oral, resp. rate 16, height 5' 10 (1.778 m), weight 61.2 kg, SpO2 100%.  LABS  Admission on 04/07/2024  Component Date Value Ref Range Status   Sodium 04/07/2024 137  135 - 145 mmol/L Final   Potassium 04/07/2024 3.9  3.5 - 5.1 mmol/L Final   Chloride 04/07/2024 101  98 - 111 mmol/L Final   CO2 04/07/2024 27  22 - 32 mmol/L Final   Glucose, Bld 04/07/2024 104 (H)  70 - 99 mg/dL Final   Glucose reference range applies only to samples taken after fasting for at least 8 hours.   BUN 04/07/2024 16  6 - 20 mg/dL Final   Creatinine, Ser 04/07/2024 0.44 (L)  0.61 - 1.24 mg/dL Final   Calcium 92/75/7974 9.1  8.9 - 10.3 mg/dL Final   Total Protein 92/75/7974 8.6 (H)  6.5 - 8.1 g/dL Final   Albumin 92/75/7974 3.8  3.5 - 5.0 g/dL Final   AST 92/75/7974 33  15 - 41 U/L Final   ALT 04/07/2024 21  0 - 44 U/L Final   Alkaline Phosphatase 04/07/2024 91  38 -  126 U/L Final   Total Bilirubin 04/07/2024 0.7  0.0 -  1.2 mg/dL Final   GFR, Estimated 04/07/2024 >60  >60 mL/min Final   Comment: (NOTE) Calculated using the CKD-EPI Creatinine Equation (2021)    Anion gap 04/07/2024 9  5 - 15 Final   Performed at [facility] [place], [address], [place], [place] [address]   WBC 04/07/2024 8.8  4.0 - 10.5 K/uL Final   RBC 04/07/2024 3.89 (L)  4.22 - 5.81 MIL/uL Final   Hemoglobin 04/07/2024 10.1 (L)  13.0 - 17.0 g/dL Final   HCT 92/75/7974 32.1 (L)  39.0 - 52.0 % Final   MCV 04/07/2024 82.5  80.0 - 100.0 fL Final   MCH 04/07/2024 26.0  26.0 - 34.0 pg Final   MCHC 04/07/2024 31.5  30.0 - 36.0 g/dL Final   RDW 92/75/7974 17.1 (H)  11.5 - 15.5 % Final   Platelets 04/07/2024 598 (H)  150 - 400 K/uL Final   nRBC 04/07/2024 0.0  0.0 - 0.2 % Final   Performed at [facility], [address], [place], [place] [address]   Lipase 04/07/2024 111 (H)  11 - 51 U/L Final   Performed at [facility], [address], [place], [place] [address]   Color, Urine 04/07/2024 YELLOW  YELLOW Final   APPearance 04/07/2024 CLEAR  CLEAR Final   Specific Gravity, Urine 04/07/2024 1.018  1.005 - 1.030 Final   pH 04/07/2024 7.0  5.0 - 8.0 Final   Glucose, UA 04/07/2024 NEGATIVE  NEGATIVE mg/dL Final   Hgb urine dipstick 04/07/2024 NEGATIVE  NEGATIVE Final   Bilirubin Urine 04/07/2024 NEGATIVE  NEGATIVE Final   Ketones, ur 04/07/2024 NEGATIVE  NEGATIVE mg/dL Final   Protein, ur 92/75/7974 NEGATIVE  NEGATIVE mg/dL Final   Nitrite 92/75/7974 NEGATIVE  NEGATIVE Final   Leukocytes,Ua 04/07/2024 NEGATIVE  NEGATIVE Final   Performed at [facility], [address] [place], [place] [address]    PSYCHIATRIC REVIEW OF SYSTEMS (ROS)  ROS: Notable for the following relevant positive findings: ROS  Additional findings:      Musculoskeletal: No abnormal movements observed      Gait & Station: Laying/Sitting       Pain Screening: Denies      Nutrition & Dental Concerns: no concerns  RISK FORMULATION/ASSESSMENT  Is the patient experiencing any suicidal or homicidal ideations: Yes       Explain if yes: says he has chronic passive si with no intent or plan and does not want treatment Protective factors considered for safety management: future oriented  Risk factors/concerns considered for safety management:  Prior attempt Substance abuse/dependence Male gender  Is there a safety management plan with the patient and treatment team to minimize risk factors and promote protective factors: Yes           Explain: monitored in the ED Is crisis care placement or psychiatric hospitalization recommended: No     Based on my current evaluation and risk assessment, patient is determined at this time to be at:  Low risk  *RISK ASSESSMENT Risk assessment is a dynamic process; it is possible that this patient's condition, and risk level, may change. This should be re-evaluated and managed over time as appropriate. Please re-consult psychiatric consult services if additional assistance is needed in terms of risk assessment and management. If your team decides to discharge this patient, please advise the patient how to best access emergency psychiatric services, or to call 911, if their condition worsens or they feel unsafe in any way.  Rosalba Totty A Elwyn Klosinski, NP Telepsychiatry Consult Services

## 2024-04-08 ENCOUNTER — Other Ambulatory Visit (HOSPITAL_COMMUNITY)
Admission: EM | Admit: 2024-04-08 | Discharge: 2024-04-08 | Disposition: A | Discharge status: Other Acute Inpatient Hospital | Source: Intra-hospital

## 2024-04-08 ENCOUNTER — Other Ambulatory Visit: Payer: Self-pay

## 2024-04-08 ENCOUNTER — Other Ambulatory Visit (HOSPITAL_COMMUNITY)
Admission: EM | Admit: 2024-04-08 | Discharge: 2024-04-09 | Disposition: A | Discharge status: Home / Self Care | Attending: Psychiatry | Admitting: Psychiatry

## 2024-04-08 ENCOUNTER — Encounter (HOSPITAL_COMMUNITY): Payer: Self-pay | Admitting: Emergency Medicine

## 2024-04-08 DIAGNOSIS — F10239 Alcohol dependence with withdrawal, unspecified: Secondary | ICD-10-CM | POA: Diagnosis present

## 2024-04-08 DIAGNOSIS — F339 Major depressive disorder, recurrent, unspecified: Secondary | ICD-10-CM | POA: Insufficient documentation

## 2024-04-08 DIAGNOSIS — F1220 Cannabis dependence, uncomplicated: Secondary | ICD-10-CM | POA: Diagnosis present

## 2024-04-08 DIAGNOSIS — F1020 Alcohol dependence, uncomplicated: Secondary | ICD-10-CM | POA: Diagnosis present

## 2024-04-08 DIAGNOSIS — F1994 Other psychoactive substance use, unspecified with psychoactive substance-induced mood disorder: Secondary | ICD-10-CM | POA: Diagnosis present

## 2024-04-08 DIAGNOSIS — F1010 Alcohol abuse, uncomplicated: Secondary | ICD-10-CM | POA: Insufficient documentation

## 2024-04-08 DIAGNOSIS — Z720 Tobacco use: Secondary | ICD-10-CM | POA: Diagnosis present

## 2024-04-08 LAB — RAPID URINE DRUG SCREEN, HOSP PERFORMED
Amphetamines: NOT DETECTED
Barbiturates: NOT DETECTED
Benzodiazepines: NOT DETECTED
Cocaine: NOT DETECTED
Opiates: NOT DETECTED
Tetrahydrocannabinol: POSITIVE — AB

## 2024-04-08 MED ORDER — TRAZODONE HCL 50 MG PO TABS
50.0000 mg | ORAL_TABLET | Freq: Every day | ORAL | Status: DC
  Administered 2024-04-08: 50 mg via ORAL
  Filled 2024-04-08: qty 1

## 2024-04-08 NOTE — Progress Notes (Signed)
 Pt has been accepted to [facility] on 04/08/2024. Bed assignment:166   Pt meets inpatient criteria per [place] Patient, NP  Attending Physician will be Dr. Dr. Leigh   Report can be called to: [phone]   Care Team Notified: [facility] Burnard Barter, RN, Ester Sloop, Paramedic, [place] Patient, NP

## 2024-04-08 NOTE — ED Provider Notes (Signed)
 Facility Based Crisis Admission H&P  Date: 04/09/24 Patient Name: Antonio Alvarez MRN: [id number] Chief Complaint: alcohol detox   Diagnoses:  Final diagnoses:  Alcohol abuse  Recurrent major depressive disorder, remission status unspecified (HCC)    HPI: Antonio Alvarez, 43 y/o male with a history of alcohol abuse, and SI presented to [facility], as a transfer from the ED.  Patient is here for detox.  According to the patient he drinks daily.  Per the patient he is also waiting to be transferred to a detox program in [place] .  Copied from consultation notes: The patient presented to the ED with complaints of gi bleeding and gastrointestinal problems secondary to his drinking. He verbalized that he wanted to detox. When he was told the the ED does not do detox he then said he was suicidal. On exam, the patient stated, yeah I'm suicidal, I'm always suicidal but I just want to detox. I'm not planning to kill myself. H is a poor historian. He talks about a program in [place]  that will come pick people up for detox at their program.  He wants to go to that program but he said that they told him he needed to get admitted.  He's not sure if that meant medically admitted or psychiatrically admitted and he's not real clear on what exactly they were trying to tell them.  I suspect it may have been needing a medical clearance before he could show up but he did not really understand what they were telling him.  He does not meet criteria for a medical admission and he does not meet criteria for psychiatric admission.  I spoke with the ED provider and he was not sure what exactly the patient wants.  I did look at the website and it appears that this facility in [place]  does do 24 seven pickups and they do take [place]  insurance.  I suggested having social work give them a call and see if they are legitimate and what do they need to have the patient do so that they can come pick him up.   This to face evaluation  of patient, patient is alert and oriented x 4, speech is clear, maintain eye contact.  Patient denied current SI, HI, AVH or paranoia.  Reports he drinks alcohol on a regular, did not state the exact amount.  This present moment patient does not seem to be influenced by internal stimuli.  Patient stated that he was waiting to be picked up tomorrow morning to go to a facility in [place]  for continuous rehabilitation.  Recommend FBC unit  PHQ 2-9:  Flowsheet Row ED from 04/08/2024 in [facility] Office Visit from 02/02/2017 in [facility] [place]  Thoughts that you would be better off dead, or of hurting yourself in some way Not at all --  PHQ-9 Total Score 7 8    Flowsheet Row ED from 04/08/2024 in [facility] ED from 04/07/2024 in [facility] Department [facility] ED to Hosp-Admission (Discharged) from 03/29/2024 in [facility]  C-SSRS RISK CATEGORY No Risk No Risk No Risk      Total Time spent with patient: 20 minutes  Musculoskeletal  Strength & Muscle Tone: within normal limits Gait & Station: normal Patient leans: N/A  Psychiatric Specialty Exam  Presentation General Appearance:  Casual  Eye Contact:No data recorded Speech: Clear and Coherent  Speech Volume: Normal  Handedness: Right   Mood and Affect  Mood: Anxious  Affect: Congruent   Thought Process  Thought Processes: Coherent  Descriptions of Associations:Intact  Orientation:Full (Time, Place and Person)  Thought Content:Logical    Hallucinations:Hallucinations: None  Ideas of Reference:None  Suicidal Thoughts:Suicidal Thoughts: No  Homicidal Thoughts:Homicidal Thoughts: No   Sensorium  Memory: Immediate Fair  Judgment: Fair  Insight: Fair   Art therapist  Concentration: Fair  Attention Span: Fair  Recall: Fair  Fund of  Knowledge: Fair  Language: Fair   Psychomotor Activity  Psychomotor Activity: Psychomotor Activity: Normal   Assets  Assets: Desire for Improvement; Resilience; Social Support   Sleep  Sleep: Sleep: Fair Number of Hours of Sleep: 6   Nutritional Assessment (For OBS and FBC admissions only) Has the patient had a weight loss or gain of 10 pounds or more in the last 3 months?: No Has the patient had a decrease in food intake/or appetite?: No Does the patient have dental problems?: No Does the patient have eating habits or behaviors that may be indicators of an eating disorder including binging or inducing vomiting?: No Has the patient recently lost weight without trying?: 0 Has the patient been eating poorly because of a decreased appetite?: 0 Malnutrition Screening Tool Score: 0    Physical Exam HENT:     Head: Normocephalic.     Nose: Nose normal.  Eyes:     Pupils: Pupils are equal, round, and reactive to light.  Cardiovascular:     Rate and Rhythm: Normal rate.  Pulmonary:     Effort: Pulmonary effort is normal.  Musculoskeletal:        General: Normal range of motion.     Cervical back: Normal range of motion.  Neurological:     General: No focal deficit present.     Mental Status: He is alert.  Psychiatric:        Mood and Affect: Mood normal.        Behavior: Behavior normal.        Thought Content: Thought content normal.        Judgment: Judgment normal.    Review of Systems  Constitutional: Negative.   HENT: Negative.    Eyes: Negative.   Respiratory: Negative.    Cardiovascular: Negative.   Gastrointestinal: Negative.   Genitourinary: Negative.   Musculoskeletal: Negative.   Skin: Negative.   Neurological: Negative.   Psychiatric/Behavioral:  Positive for substance abuse. The patient is nervous/anxious.     There were no vitals taken for this visit. There is no height or weight on file to calculate BMI.  Past Psychiatric History:  Alcohol abuse, SI  Is the patient at risk to self? No  Has the patient been a risk to self in the past 6 months? Yes .    Has the patient been a risk to self within the distant past? Yes   Is the patient a risk to others? No   Has the patient been a risk to others in the past 6 months? No   Has the patient been a risk to others within the distant past? No   Past Medical History: See chart Family History: Unknown Social History: Lupita abuse  Last Labs:  Admission on 04/07/2024, Discharged on 04/08/2024  Component Date Value Ref Range Status   Sodium 04/07/2024 137  135 - 145 mmol/L Final   Potassium 04/07/2024 3.9  3.5 - 5.1 mmol/L Final   Chloride 04/07/2024 101  98 - 111 mmol/L Final   CO2 04/07/2024 27  22 - 32 mmol/L Final   Glucose, Bld 04/07/2024 104 (H)  70 - 99 mg/dL Final   Glucose reference range applies only to samples taken after fasting for at least 8 hours.   BUN 04/07/2024 16  6 - 20 mg/dL Final   Creatinine, Ser 04/07/2024 0.44 (L)  0.61 - 1.24 mg/dL Final   Calcium 92/75/7974 9.1  8.9 - 10.3 mg/dL Final   Total Protein 92/75/7974 8.6 (H)  6.5 - 8.1 g/dL Final   Albumin 92/75/7974 3.8  3.5 - 5.0 g/dL Final   AST 92/75/7974 33  15 - 41 U/L Final   ALT 04/07/2024 21  0 - 44 U/L Final   Alkaline Phosphatase 04/07/2024 91  38 - 126 U/L Final   Total Bilirubin 04/07/2024 0.7  0.0 - 1.2 mg/dL Final   GFR, Estimated 04/07/2024 >60  >60 mL/min Final   Comment: (NOTE) Calculated using the CKD-EPI Creatinine Equation (2021)    Anion gap 04/07/2024 9  5 - 15 Final   Performed at [facility], [address], [place], [place] [address]   WBC 04/07/2024 8.8  4.0 - 10.5 K/uL Final   RBC 04/07/2024 3.89 (L)  4.22 - 5.81 MIL/uL Final   Hemoglobin 04/07/2024 10.1 (L)  13.0 - 17.0 g/dL Final   HCT 92/75/7974 32.1 (L)  39.0 - 52.0 % Final   MCV 04/07/2024 82.5  80.0 - 100.0 fL Final   MCH 04/07/2024 26.0  26.0 - 34.0 pg Final   MCHC 04/07/2024 31.5  30.0 -  36.0 g/dL Final   RDW 92/75/7974 17.1 (H)  11.5 - 15.5 % Final   Platelets 04/07/2024 598 (H)  150 - 400 K/uL Final   nRBC 04/07/2024 0.0  0.0 - 0.2 % Final   Performed at [facility], [address] [place], [place] [address]   Lipase 04/07/2024 111 (H)  11 - 51 U/L Final   Performed at [facility], [address] [place], [place] [address]   Color, Urine 04/07/2024 YELLOW  YELLOW Final   APPearance 04/07/2024 CLEAR  CLEAR Final   Specific Gravity, Urine 04/07/2024 1.018  1.005 - 1.030 Final   pH 04/07/2024 7.0  5.0 - 8.0 Final   Glucose, UA 04/07/2024 NEGATIVE  NEGATIVE mg/dL Final   Hgb urine dipstick 04/07/2024 NEGATIVE  NEGATIVE Final   Bilirubin Urine 04/07/2024 NEGATIVE  NEGATIVE Final   Ketones, ur 04/07/2024 NEGATIVE  NEGATIVE mg/dL Final   Protein, ur 92/75/7974 NEGATIVE  NEGATIVE mg/dL Final   Nitrite 92/75/7974 NEGATIVE  NEGATIVE Final   Leukocytes,Ua 04/07/2024 NEGATIVE  NEGATIVE Final   Performed at [facility], [address], [place], [place] [address]   Opiates 04/08/2024 NONE DETECTED  NONE DETECTED Final   Cocaine 04/08/2024 NONE DETECTED  NONE DETECTED Final   Benzodiazepines 04/08/2024 NONE DETECTED  NONE DETECTED Final   Amphetamines 04/08/2024 NONE DETECTED  NONE DETECTED Final   Tetrahydrocannabinol 04/08/2024 POSITIVE (A)  NONE DETECTED Final   Barbiturates 04/08/2024 NONE DETECTED  NONE DETECTED Final   Comment: (NOTE) DRUG SCREEN FOR MEDICAL PURPOSES ONLY.  IF CONFIRMATION IS NEEDED FOR ANY PURPOSE, NOTIFY LAB WITHIN 5 DAYS.  LOWEST DETECTABLE LIMITS FOR URINE DRUG SCREEN Drug Class                     Cutoff (ng/mL) Amphetamine and metabolites    1000 Barbiturate and  metabolites    200 Benzodiazepine                 200 Opiates and metabolites        300 Cocaine and metabolites        300 THC                            50 Performed at [facility], [address], [place], [place] [address]   Admission on 03/29/2024, Discharged on 04/01/2024  Component Date Value Ref Range Status   Sodium 03/29/2024 136  135 - 145 mmol/L Final   Potassium 03/29/2024 3.9  3.5 - 5.1 mmol/L Final   Chloride 03/29/2024 98  98 - 111 mmol/L Final   CO2 03/29/2024 25  22 - 32 mmol/L Final   Glucose, Bld 03/29/2024 125 (H)  70 - 99 mg/dL Final   Glucose reference range applies only to samples taken after fasting for at least 8 hours.   BUN 03/29/2024 13  6 - 20 mg/dL Final   Creatinine, Ser 03/29/2024 0.61  0.61 - 1.24 mg/dL Final   Calcium 92/84/7974 9.2  8.9 - 10.3 mg/dL Final   Total Protein 92/84/7974 8.3 (H)  6.5 - 8.1 g/dL Final   Albumin 92/84/7974 3.6  3.5 - 5.0 g/dL Final   AST 92/84/7974 29  15 - 41 U/L Final   ALT 03/29/2024 19  0 - 44 U/L Final   Alkaline Phosphatase 03/29/2024 93  38 - 126 U/L Final   Total Bilirubin 03/29/2024 0.6  0.0 - 1.2 mg/dL Final   GFR, Estimated 03/29/2024 >60  >60 mL/min Final   Comment: (NOTE) Calculated using the CKD-EPI Creatinine Equation (2021)    Anion gap 03/29/2024 13  5 - 15 Final   Performed at [facility], [address], [place], [place] [address]   WBC 03/29/2024 11.3 (H)  4.0 - 10.5 K/uL Final   RBC 03/29/2024 4.37  4.22 - 5.81 MIL/uL Final   Hemoglobin 03/29/2024 11.5 (L)  13.0 - 17.0 g/dL Final   HCT 92/84/7974 36.2 (L)  39.0 - 52.0 % Final   MCV 03/29/2024 82.8  80.0 - 100.0 fL Final   MCH 03/29/2024 26.3  26.0 - 34.0 pg Final   MCHC 03/29/2024 31.8  30.0 - 36.0 g/dL Final   RDW 92/84/7974 17.5 (H)  11.5 - 15.5 % Final   Platelets 03/29/2024 449 (H)  150 - 400 K/uL Final   nRBC 03/29/2024 0.0  0.0 - 0.2 % Final   Neutrophils Relative % 03/29/2024 74  % Final   Neutro Abs 03/29/2024 8.5 (H)  1.7 - 7.7 K/uL Final   Lymphocytes Relative 03/29/2024 17  % Final   Lymphs Abs 03/29/2024 1.9  0.7 - 4.0 K/uL Final   Monocytes Relative 03/29/2024 6  % Final   Monocytes Absolute 03/29/2024 0.6   0.1 - 1.0 K/uL Final   Eosinophils Relative 03/29/2024 1  % Final   Eosinophils Absolute 03/29/2024 0.1  0.0 - 0.5 K/uL Final   Basophils Relative 03/29/2024 1  % Final   Basophils Absolute 03/29/2024 0.1  0.0 - 0.1 K/uL Final   Immature Granulocytes 03/29/2024 1  % Final   Abs Immature Granulocytes 03/29/2024 0.08 (H)  0.00 - 0.07 K/uL Final   Performed at [facility], [address] [place], [place] [address]   Prothrombin Time 03/29/2024 13.6  11.4 - 15.2  seconds Final   INR 03/29/2024 1.0  0.8 - 1.2 Final   Comment: (NOTE) INR goal varies based on device and disease states. Performed at [facility], [address] [place], [place] [address]    ABO/RH(D) 03/29/2024 O POS   Final   Antibody Screen 03/29/2024 NEG   Final   Sample Expiration 03/29/2024    Final                   Value:04/01/2024,2359 Performed at [facility], [address] [place], [place] [address]    Lipase 03/29/2024 193 (H)  11 - 51 U/L Final   Performed at [facility], [address] [place], [place] [address]   ABO/RH(D) 03/29/2024    Final                   Value:O POS Performed at [facility], [address], [place], [place] [address]    Sodium 03/30/2024 132 (L)  135 - 145 mmol/L Final   Potassium 03/30/2024 3.7  3.5 - 5.1 mmol/L Final   Chloride 03/30/2024 96 (L)  98 - 111 mmol/L Final   CO2 03/30/2024 27  22 - 32 mmol/L Final   Glucose, Bld 03/30/2024 103 (H)  70 - 99 mg/dL Final   Glucose reference range applies only to samples taken after fasting for at least 8 hours.   BUN 03/30/2024 9  6 - 20 mg/dL Final   Creatinine, Ser 03/30/2024 0.77  0.61 - 1.24 mg/dL Final   Calcium 92/83/7974 8.8 (L)  8.9 - 10.3 mg/dL Final   GFR, Estimated 03/30/2024 >60  >60 mL/min Final   Comment: (NOTE) Calculated using the CKD-EPI Creatinine Equation (2021)    Anion gap 03/30/2024 9  5 - 15 Final   Performed at  [facility], [address], [place], [place] [address]   WBC 03/30/2024 9.1  4.0 - 10.5 K/uL Final   RBC 03/30/2024 3.74 (L)  4.22 - 5.81 MIL/uL Final   Hemoglobin 03/30/2024 10.0 (L)  13.0 - 17.0 g/dL Final   HCT 92/83/7974 31.3 (L)  39.0 - 52.0 % Final   MCV 03/30/2024 83.7  80.0 - 100.0 fL Final   MCH 03/30/2024 26.7  26.0 - 34.0 pg Final   MCHC 03/30/2024 31.9  30.0 - 36.0 g/dL Final   RDW 92/83/7974 17.4 (H)  11.5 - 15.5 % Final   Platelets 03/30/2024 359  150 - 400 K/uL Final   nRBC 03/30/2024 0.0  0.0 - 0.2 % Final   Performed at [facility], [address] [place], [place] [address]   T Pallidum Abs 03/29/2024 Reactive (A)  Non Reactive Final   Comment: (NOTE) Performed At: [facility] [address] [place], [place] [id number] Jennette Shorter MD Ey:[id number]   Admission on 03/26/2024, Discharged on 03/26/2024  Component Date Value Ref Range Status   Neisseria Gonorrhea 03/26/2024 Negative   Final   Chlamydia 03/26/2024 Negative   Final   Comment 03/26/2024 Normal Reference Ranger Chlamydia - Negative   Final   Comment 03/26/2024 Normal Reference Range Neisseria Gonorrhea - Negative   Final  Admission on 03/17/2024, Discharged on 03/19/2024  Component Date Value Ref Range Status   Sodium 03/17/2024 139  135 - 145 mmol/L Final   Potassium 03/17/2024 4.1  3.5 - 5.1 mmol/L Final   Chloride 03/17/2024 101  98 - 111 mmol/L Final   CO2 03/17/2024 27  22 - 32 mmol/L Final   Glucose, Bld 03/17/2024 117 (H)  70 - 99 mg/dL Final   Glucose reference range applies only to samples taken after fasting for at least 8 hours.   BUN 03/17/2024 9  6 - 20 mg/dL Final   Creatinine, Ser 03/17/2024 0.73  0.61 - 1.24 mg/dL Final   Calcium 92/96/7974 9.0  8.9 - 10.3 mg/dL Final   GFR, Estimated 03/17/2024 >60  >60 mL/min Final   Comment: (NOTE) Calculated using the CKD-EPI Creatinine Equation (2021)    Anion gap 03/17/2024 11  5 - 15 Final    Performed at [facility], [address] [place], [place] [address]   WBC 03/17/2024 12.0 (H)  4.0 - 10.5 K/uL Final   RBC 03/17/2024 4.70  4.22 - 5.81 MIL/uL Final   Hemoglobin 03/17/2024 12.5 (L)  13.0 - 17.0 g/dL Final   HCT 92/96/7974 39.6  39.0 - 52.0 % Final   MCV 03/17/2024 84.3  80.0 - 100.0 fL Final   MCH 03/17/2024 26.6  26.0 - 34.0 pg Final   MCHC 03/17/2024 31.6  30.0 - 36.0 g/dL Final   RDW 92/96/7974 17.7 (H)  11.5 - 15.5 % Final   Platelets 03/17/2024 523 (H)  150 - 400 K/uL Final   nRBC 03/17/2024 0.0  0.0 - 0.2 % Final   Performed at [facility], [address], [place], [place] [address]   Troponin I (High Sensitivity) 03/17/2024 <2  <18 ng/L Final   Comment: (NOTE) Elevated high sensitivity troponin I (hsTnI) values and significant  changes across serial measurements may suggest ACS but many other  chronic and acute conditions are known to elevate hsTnI results.  Refer to the Links section for chest pain algorithms and additional  guidance. Performed at [facility], [address] [place], [place] [address]    Troponin I (High Sensitivity) 03/17/2024 <2  <18 ng/L Final   Comment: (NOTE) Elevated high sensitivity troponin I (hsTnI) values and significant  changes across serial measurements may suggest ACS but many other  chronic and acute conditions are known to elevate hsTnI results.  Refer to the Links section for chest pain algorithms and additional  guidance. Performed at [facility], [address], [place], [place] [address]    Lipase 03/17/2024 52 (H)  11 - 51 U/L Final   Performed at [facility], [address], [place], [place] [address]   Total Protein 03/17/2024 8.0  6.5 - 8.1 g/dL Final   Albumin 92/96/7974 3.7  3.5 - 5.0 g/dL Final   AST 92/96/7974 24  15 - 41 U/L Final   ALT 03/17/2024 22  0 - 44 U/L Final   Alkaline Phosphatase 03/17/2024 82   38 - 126 U/L Final   Total Bilirubin 03/17/2024 0.8  0.0 - 1.2 mg/dL Final   Bilirubin, Direct 03/17/2024 0.1  0.0 - 0.2 mg/dL Final   Indirect Bilirubin 03/17/2024 0.7  0.3 - 0.9 mg/dL Final   Performed at [facility], [address], [place], [place] [address]   Total CK 03/17/2024 304  49 - 397 U/L Final   Performed at [facility], [address] [place], [place] [address]   Magnesium  03/17/2024 1.6 (L)  1.7 - 2.4 mg/dL Final   Performed at [facility], [address] [place], [place] [address]   Phosphorus 03/17/2024 4.0  2.5 - 4.6 mg/dL Final  Performed at [facility], [address], [place], [place] [address]   Alcohol, Ethyl (B) 03/17/2024 <15  <15 mg/dL Final   Comment: (NOTE) For medical purposes only. Performed at [facility], [address], [place], [place] [address]    Prealbumin 03/18/2024 12 (L)  18 - 38 mg/dL Final   Performed at [facility], [address] [place], [place] [address]   Prothrombin Time 03/17/2024 14.1  11.4 - 15.2 seconds Final   INR 03/17/2024 1.0  0.8 - 1.2 Final   Comment: (NOTE) INR goal varies based on device and disease states. Performed at [facility], [address] [place], [place] [address]    Cholesterol 03/18/2024 133  0 - 200 mg/dL Final   Triglycerides 92/95/7974 61  <150 mg/dL Final   HDL 92/95/7974 51  >40 mg/dL Final   Total CHOL/HDL Ratio 03/18/2024 2.6  RATIO Final   VLDL 03/18/2024 12  0 - 40 mg/dL Final   LDL Cholesterol 03/18/2024 70  0 - 99 mg/dL Final   Comment:        Total Cholesterol/HDL:CHD Risk Coronary Heart Disease Risk Table                     Men   Women  1/2 Average Risk   3.4   3.3  Average Risk       5.0   4.4  2 X Average Risk   9.6   7.1  3 X Average Risk  23.4   11.0        Use the calculated Patient Ratio above and the CHD Risk Table to determine the patient's CHD  Risk.        ATP III CLASSIFICATION (LDL):  <100     mg/dL   Optimal  899-870  mg/dL   Near or Above                    Optimal  130-159  mg/dL   Borderline  839-810  mg/dL   High  >809     mg/dL   Very High Performed at [facility], [address], [place], [place] [address]    HIV Screen 4th Generation wRfx 03/18/2024 Non Reactive  Non Reactive Final   Performed at [facility], [address] [place], [place] [address]   Magnesium  03/18/2024 2.3  1.7 - 2.4 mg/dL Final   Performed at [facility], [address] [place], [place] [address]   Phosphorus 03/18/2024 3.3  2.5 - 4.6 mg/dL Final   Performed at [facility], [address], [place], [place] [address]   Sodium 03/18/2024 136  135 - 145 mmol/L Final   Potassium 03/18/2024 3.7  3.5 - 5.1 mmol/L Final   Chloride 03/18/2024 102  98 - 111 mmol/L Final   CO2 03/18/2024 23  22 - 32 mmol/L Final   Glucose, Bld 03/18/2024 87  70 - 99 mg/dL Final   Glucose reference range applies only to samples taken after fasting for at least 8 hours.   BUN 03/18/2024 8  6 - 20 mg/dL Final   Creatinine, Ser 03/18/2024 0.94  0.61 - 1.24 mg/dL Final   Calcium 92/95/7974 8.1 (L)  8.9 - 10.3 mg/dL Final   Total Protein 92/95/7974 6.1 (L)  6.5 - 8.1 g/dL Final   Albumin 92/95/7974 2.7 (L)  3.5 - 5.0 g/dL Final  AST 03/18/2024 16  15 - 41 U/L Final   ALT 03/18/2024 15  0 - 44 U/L Final   Alkaline Phosphatase 03/18/2024 67  38 - 126 U/L Final   Total Bilirubin 03/18/2024 0.7  0.0 - 1.2 mg/dL Final   GFR, Estimated 03/18/2024 >60  >60 mL/min Final   Comment: (NOTE) Calculated using the CKD-EPI Creatinine Equation (2021)    Anion gap 03/18/2024 11  5 - 15 Final   Performed at [facility], [address] [place], [place] [address]   WBC 03/18/2024 9.7  4.0 - 10.5 K/uL Final   RBC 03/18/2024 3.84 (L)  4.22 - 5.81 MIL/uL Final   Hemoglobin 03/18/2024 10.4 (L)  13.0 - 17.0 g/dL  Final   HCT 92/95/7974 32.7 (L)  39.0 - 52.0 % Final   MCV 03/18/2024 85.2  80.0 - 100.0 fL Final   MCH 03/18/2024 27.1  26.0 - 34.0 pg Final   MCHC 03/18/2024 31.8  30.0 - 36.0 g/dL Final   RDW 92/95/7974 17.6 (H)  11.5 - 15.5 % Final   Platelets 03/18/2024 405 (H)  150 - 400 K/uL Final   nRBC 03/18/2024 0.0  0.0 - 0.2 % Final   Performed at [facility], [address] [place], [place] [address]   Folate 03/18/2024 >40.0  >5.9 ng/mL Final   Comment: RESULT CONFIRMED BY MANUAL DILUTION Performed at [facility], [address] [place], [place] [address]    Iron 03/18/2024 19 (L)  45 - 182 ug/dL Final   TIBC 92/95/7974 360  250 - 450 ug/dL Final   Saturation Ratios 03/18/2024 5 (L)  17.9 - 39.5 % Final   UIBC 03/18/2024 341  ug/dL Final   Performed at [facility], [address], [place], [place] [address]   Ferritin 03/18/2024 22 (L)  24 - 336 ng/mL Final   Performed at [facility], [address], [place], [place] [address]   Retic Ct Pct 03/18/2024 1.0  0.4 - 3.1 % Final   RBC. 03/18/2024 3.88 (L)  4.22 - 5.81 MIL/uL Final   Retic Count, Absolute 03/18/2024 36.9  19.0 - 186.0 K/uL Final   Immature Retic Fract 03/18/2024 11.7  2.3 - 15.9 % Final   Performed at [facility], [address] [place], [place], [place] [address]   Vitamin B-12 03/18/2024 306  180 - 914 pg/mL Final   Comment: (NOTE) This assay is not validated for testing neonatal or myeloproliferative syndrome specimens for Vitamin B12 levels. Performed at [facility], [address], [place], [place] [address]    Transferrin 03/18/2024 259  180 - 329 mg/dL Final   Performed at [facility], [address] [place], [place] [address]   RPR Ser Ql 03/18/2024 Reactive (A)  NON REACTIVE Final   SENT FOR CONFIRMATION   RPR Titer 03/18/2024 1:32   Final   Performed at [facility],  [address], [place], [place] [address]   HSV 1 Glycoprotein G Ab, IgG 03/18/2024 Non Reactive  Non Reactive Final   Comment: (NOTE) **Please note reference interval change** HSV-1 IgG testing performed using the Roche Elecsys HSV-1 IgG assay.    HSV 2 Glycoprotein G Ab, IgG 03/18/2024 Reactive (A)  Non Reactive Final   Comment: (NOTE) **Please note reference interval change** Current guidelines and recommendations do not recommend routine screening for HSV-2 in asymptomatic individuals, including those that are pregnant. The  detection of HSV-2 IgG antibodies in a single sample indicates previous exposure to HSV-2 but does not give information as to the site of HSV infection or the timing of exposure. The predictive value of positive and negative results depends on the population's prevalence and the pretest likelihood of HSV-2. HSV-2 IgG testing performed using the Roche Elecsys HSV-2 IgG assay. Performed At: [facility] [address] [place], [place] [id number] Jennette Shorter MD Ey:[phone]    WBC 03/19/2024 6.0  4.0 - 10.5 K/uL Final   RBC 03/19/2024 3.80 (L)  4.22 - 5.81 MIL/uL Final   Hemoglobin 03/19/2024 10.1 (L)  13.0 - 17.0 g/dL Final   HCT 92/94/7974 32.1 (L)  39.0 - 52.0 % Final   MCV 03/19/2024 84.5  80.0 - 100.0 fL Final   MCH 03/19/2024 26.6  26.0 - 34.0 pg Final   MCHC 03/19/2024 31.5  30.0 - 36.0 g/dL Final   RDW 92/94/7974 17.5 (H)  11.5 - 15.5 % Final   Platelets 03/19/2024 398  150 - 400 K/uL Final   nRBC 03/19/2024 0.0  0.0 - 0.2 % Final   Neutrophils Relative % 03/19/2024 53  % Final   Neutro Abs 03/19/2024 3.2  1.7 - 7.7 K/uL Final   Lymphocytes Relative 03/19/2024 31  % Final   Lymphs Abs 03/19/2024 1.9  0.7 - 4.0 K/uL Final   Monocytes Relative 03/19/2024 11  % Final   Monocytes Absolute 03/19/2024 0.6  0.1 - 1.0 K/uL Final   Eosinophils Relative 03/19/2024 4  % Final   Eosinophils Absolute 03/19/2024 0.2  0.0 - 0.5 K/uL Final   Basophils  Relative 03/19/2024 1  % Final   Basophils Absolute 03/19/2024 0.0  0.0 - 0.1 K/uL Final   Immature Granulocytes 03/19/2024 0  % Final   Abs Immature Granulocytes 03/19/2024 0.01  0.00 - 0.07 K/uL Final   Performed at [facility], [address] [place], [place] [address]   Sodium 03/19/2024 139  135 - 145 mmol/L Final   Potassium 03/19/2024 3.8  3.5 - 5.1 mmol/L Final   Chloride 03/19/2024 103  98 - 111 mmol/L Final   CO2 03/19/2024 27  22 - 32 mmol/L Final   Glucose, Bld 03/19/2024 97  70 - 99 mg/dL Final   Glucose reference range applies only to samples taken after fasting for at least 8 hours.   BUN 03/19/2024 5 (L)  6 - 20 mg/dL Final   Creatinine, Ser 03/19/2024 0.74  0.61 - 1.24 mg/dL Final   Calcium 92/94/7974 8.5 (L)  8.9 - 10.3 mg/dL Final   Total Protein 92/94/7974 6.5  6.5 - 8.1 g/dL Final   Albumin 92/94/7974 2.8 (L)  3.5 - 5.0 g/dL Final   AST 92/94/7974 19  15 - 41 U/L Final   ALT 03/19/2024 15  0 - 44 U/L Final   Alkaline Phosphatase 03/19/2024 63  38 - 126 U/L Final   Total Bilirubin 03/19/2024 0.5  0.0 - 1.2 mg/dL Final   GFR, Estimated 03/19/2024 >60  >60 mL/min Final   Comment: (NOTE) Calculated using the CKD-EPI Creatinine Equation (2021)    Anion gap 03/19/2024 9  5 - 15 Final   Performed at [facility], [address], [place], [place] [address]   T Pallidum Abs 03/18/2024 Reactive (A)  Non Reactive Final   Comment: (NOTE) Performed At: [facility] [address] [place], [place] [id number] Jennette Shorter MD Ey:[phone]     Allergies: Patient has  no known allergies.  Medications:  Facility Ordered Medications  Medication   pantoprazole  (PROTONIX ) EC tablet 40 mg   traZODone  (DESYREL ) tablet 50 mg   thiamine  (VITAMIN B1) tablet 100 mg   lipase/protease/amylase (CREON ) capsule 12,000 Units   doxycycline  (VIBRA -TABS) tablet 100 mg   acetaminophen  (TYLENOL ) tablet 650 mg   alum & mag hydroxide-simeth  (MAALOX/MYLANTA) 200-200-20 MG/5ML suspension 30 mL   magnesium  hydroxide (MILK OF MAGNESIA) suspension 30 mL   thiamine  (VITAMIN B1) injection 100 mg   [START ON 04/10/2024] thiamine  (VITAMIN B1) tablet 100 mg   multivitamin with minerals tablet 1 tablet   LORazepam  (ATIVAN ) tablet 1 mg   hydrOXYzine  (ATARAX ) tablet 25 mg   loperamide  (IMODIUM ) capsule 2-4 mg   ondansetron  (ZOFRAN -ODT) disintegrating tablet 4 mg   haloperidol  (HALDOL ) tablet 5 mg   And   diphenhydrAMINE  (BENADRYL ) capsule 50 mg   OLANZapine  (ZYPREXA ) injection 5 mg   OLANZapine  (ZYPREXA ) injection 10 mg   PTA Medications  Medication Sig   thiamine  (VITAMIN B-1) 100 MG tablet Take 1 tablet (100 mg total) by mouth daily. (Patient not taking: Reported on 04/07/2024)   pantoprazole  (PROTONIX ) 40 MG tablet Take 1 tablet (40 mg total) by mouth daily. (Patient not taking: Reported on 04/07/2024)   oxyCODONE  (OXY IR/ROXICODONE ) 5 MG immediate release tablet Take 1 tablet (5 mg total) by mouth every 6 (six) hours as needed for moderate pain (pain score 4-6). (Patient not taking: Reported on 04/07/2024)   lipase/protease/amylase (CREON ) 12000-38000 units CPEP capsule Take 1 capsule (12,000 Units total) by mouth 3 (three) times daily with meals. (Patient not taking: Reported on 04/07/2024)   doxycycline  (VIBRA -TABS) 100 MG tablet Take 1 tablet (100 mg total) by mouth 2 (two) times daily for 28 days. (Patient not taking: Reported on 04/07/2024)    Long Term Goals: Improvement in symptoms so as ready for discharge  Short Term Goals: Patient will verbalize feelings in meetings with treatment team members., Patient will attend at least of 50% of the groups daily., Pt will complete the PHQ9 on admission, day 3 and discharge., Patient will participate in completing the [place] Suicide Severity Rating Scale, and Patient will take medications as prescribed daily.  Medical Decision Making  [facility] unit    Recommendations  Based on my evaluation  the patient does not appear to have an emergency medical condition.  Gaither Pouch, NP 04/09/24  5:51 AM

## 2024-04-08 NOTE — ED Notes (Signed)
 Pt belongings bag moved to locker for 35.

## 2024-04-08 NOTE — ED Notes (Signed)
 [facility] called [facility] in [place]  to receive an update on the status of pts insurance verification and admission. The admissions person said that she would check and get call back shortly with information. Admissions also said that they would be able to provide transportation to their facility if pt is approved for admission.   Chesley Holt, [facility]  04/08/24

## 2024-04-08 NOTE — ED Notes (Signed)
 Patient admitted from [facility] to [facility] for ETOH detox. On arrival, pt A/OX4  MAE. Denies SI/HI/AVH. Oriented to room and unit.Skin assessments unremarkable.  MHT offered food and drink. NAD.Environment secured per policy.

## 2024-04-08 NOTE — Discharge Instructions (Signed)
 You will be transported for inpatient psychiatric care

## 2024-04-08 NOTE — ED Provider Notes (Signed)
 Emergency Medicine Observation Re-evaluation Note  Antonio Alvarez is a 43 y.o. male, seen on rounds today.  Pt initially presented to the ED for complaints of Hematemesis and Head Injury Currently, the patient is awaiting psych placement.  Physical Exam  BP 122/78 (BP Location: Right Arm)   Pulse (!) 56   Temp 98.4 F (36.9 C) (Oral)   Resp 18   Ht 5' 10 (1.778 m)   Wt 61.2 kg   SpO2 100%   BMI 19.37 kg/m  Physical Exam Alert and in no acute distress  ED Course / MDM  EKG:   I have reviewed the labs performed to date as well as medications administered while in observation.  Recent changes in the last 24 hours include none.  Plan  Current plan is for placement.    Suzette Pac, MD 04/08/24 [phone]

## 2024-04-08 NOTE — Consult Note (Addendum)
  Patient stated that he spoke with someone at [facility] from admissions last night and that the nurse spoke to them immediately after.  He is waiting for transport to go to their detox facility in [place] .    Provider spoke with admissions staff at [facility] [phone] who confirmed that they spoke with patient and are waiting for their insurance staff to verify his insurance before offering admission.  Provided [facility] phone number for call back.

## 2024-04-08 NOTE — ED Notes (Signed)
 Pt finishing his breakfast and will move to room 35

## 2024-04-08 NOTE — ED Provider Notes (Signed)
 Clinical Course as of 04/08/24 1928  Thu Apr 07, 2024  1903 Medically clear for TTS eval [CP]  Fri Apr 08, 2024  1859 Patient mains stable here.  Appropriate for transfer to behavioral health urgent care [facility] for inpatient psychiatric hospitalization.  Will arrange for safe transport [MP]    Clinical Course User Index [CP] Prosperi, Sherlean DEL, PA-C [MP] Pamella Ozell LABOR, DO      Pamella Ozell LABOR, DO 04/08/24 GENNIE

## 2024-04-09 MED ORDER — LORAZEPAM 1 MG PO TABS: 1.0000 mg | ORAL_TABLET | Freq: Four times a day (QID) | ORAL | Status: DC | PRN

## 2024-04-09 MED ORDER — ALUM & MAG HYDROXIDE-SIMETH 200-200-20 MG/5ML PO SUSP: 30.0000 mL | ORAL | Status: DC | PRN

## 2024-04-09 MED ORDER — DOXYCYCLINE HYCLATE 100 MG PO TABS
100.0000 mg | ORAL_TABLET | Freq: Two times a day (BID) | ORAL | Status: DC
  Administered 2024-04-09: 100 mg via ORAL
  Filled 2024-04-09: qty 1

## 2024-04-09 MED ORDER — PANCRELIPASE (LIP-PROT-AMYL) 12000-38000 UNITS PO CPEP
12000.0000 [IU] | ORAL_CAPSULE | Freq: Three times a day (TID) | ORAL | Status: DC
  Administered 2024-04-09: 12000 [IU] via ORAL
  Filled 2024-04-09: qty 1

## 2024-04-09 MED ORDER — DIPHENHYDRAMINE HCL 50 MG PO CAPS: 50.0000 mg | ORAL_CAPSULE | Freq: Three times a day (TID) | ORAL | Status: DC | PRN

## 2024-04-09 MED ORDER — ONDANSETRON 4 MG PO TBDP: 4.0000 mg | ORAL_TABLET | Freq: Four times a day (QID) | ORAL | Status: DC | PRN

## 2024-04-09 MED ORDER — LOPERAMIDE HCL 2 MG PO CAPS: 2.0000 mg | ORAL_CAPSULE | ORAL | Status: DC | PRN

## 2024-04-09 MED ORDER — OLANZAPINE 10 MG IM SOLR: 5.0000 mg | Freq: Three times a day (TID) | INTRAMUSCULAR | Status: DC | PRN

## 2024-04-09 MED ORDER — HALOPERIDOL 5 MG PO TABS: 5.0000 mg | ORAL_TABLET | Freq: Three times a day (TID) | ORAL | Status: DC | PRN

## 2024-04-09 MED ORDER — THIAMINE MONONITRATE 100 MG PO TABS: 100.0000 mg | ORAL_TABLET | Freq: Every day | ORAL | Status: DC

## 2024-04-09 MED ORDER — HYDROXYZINE HCL 25 MG PO TABS: 25.0000 mg | ORAL_TABLET | Freq: Four times a day (QID) | ORAL | Status: DC | PRN

## 2024-04-09 MED ORDER — THIAMINE HCL 100 MG/ML IJ SOLN: 100.0000 mg | Freq: Once | INTRAMUSCULAR | Status: DC

## 2024-04-09 MED ORDER — ACETAMINOPHEN 325 MG PO TABS: 650.0000 mg | ORAL_TABLET | Freq: Four times a day (QID) | ORAL | Status: DC | PRN

## 2024-04-09 MED ORDER — MAGNESIUM HYDROXIDE 400 MG/5ML PO SUSP: 30.0000 mL | Freq: Every day | ORAL | Status: DC | PRN

## 2024-04-09 MED ORDER — ADULT MULTIVITAMIN W/MINERALS CH
1.0000 | ORAL_TABLET | Freq: Every day | ORAL | Status: DC
  Administered 2024-04-09: 1 via ORAL
  Filled 2024-04-09: qty 1

## 2024-04-09 MED ORDER — OLANZAPINE 10 MG IM SOLR: 10.0000 mg | Freq: Three times a day (TID) | INTRAMUSCULAR | Status: DC | PRN

## 2024-04-09 MED ORDER — PANTOPRAZOLE SODIUM 40 MG PO TBEC
40.0000 mg | DELAYED_RELEASE_TABLET | Freq: Two times a day (BID) | ORAL | Status: DC
  Administered 2024-04-09: 40 mg via ORAL
  Filled 2024-04-09: qty 1

## 2024-04-09 MED ORDER — THIAMINE MONONITRATE 100 MG PO TABS
100.0000 mg | ORAL_TABLET | Freq: Every day | ORAL | Status: DC
  Filled 2024-04-09: qty 1

## 2024-04-09 MED ORDER — TRAZODONE HCL 50 MG PO TABS: 50.0000 mg | ORAL_TABLET | Freq: Every day | ORAL | Status: DC

## 2024-04-09 NOTE — ED Notes (Signed)
 Pt requesting for discharge,  He states he has a lot of things to take care of (get social security card) before going to rehab in [place] .

## 2024-04-09 NOTE — ED Provider Notes (Signed)
 FBC/OBS ASAP Discharge Summary  Date and Time: 04/09/2024 11:22 AM  Name: Antonio Alvarez  MRN:  [id number]   Discharge Diagnoses:  Final diagnoses:  Alcohol abuse  Recurrent major depressive disorder, remission status unspecified (HCC)    Subjective: on day of discharge, the patient says that he feels detoxed and is not having cravings or withdrawal. He does not want to stay any longer, and that he has stuff to do, including going to get ID from his mother's home prior to going to the [facility] in [place] . He has been in contact with this program personally and still has the phone number. He has a slight headache but otherwise no physical complaints. He denies feeling depressed, delusions and hallucinations.   Stay Summary: During the course of patient's hospitalization, the 15-minute checks were adequate to ensure patient's safety. Patient did not exhibit erratic or aggressive behavior and was compliant with scheduled medication. Patient was recommended for outpatient psychiatry follow-up.  At the time of discharge patient is not reporting any acute suicidal/homicidal ideations/AVH, delusional thoughts or paranoia. Patient did not appear to be responding to any internal stimuli. Patient feels more confident about self-care & in managing their mental health problems. Patient currently denies any new issues or concerns. Education and supportive counseling provided throughout patient's hospital stay & upon discharge.  patient was safely detoxed fom alcohol during the course of his stay, and any symptoms of withdrawal were addressed and have since resolved. At time of discharge the patient is not experiencing clinical syptoms of withdrawal and denies having any subjective symptoms. At time of discharge CIWA was 0.   Today upon discharge evaluation, the patient gives a mood of appropriate to circumstances. Patient denies any specific concerns and has no new physical complaints. Patient slept  well, appetite good, regular bowel movements. Patient feels that the medications have been helpful & is in agreement to continue current treatment regimen as recommended. Patient was able to engage in safety planning including plan to return to [facility]Facility [facility] care unit [facility]), the nearest emergency room or contact emergency services if patient feels unable to maintain their own safety or the safety of others. Patient had no further questions, comments, or concerns. Patient left [facility]Facility [facility] care unit [facility]) with all personal belongings in no apparent distress. Transportation per safe transport to home was arranged for patient.  Total Time spent with patient: 30 minutes  Past Psychiatric History: alcohol abuse, suicidal statements  Past Medical History: See chart Family History: Unknown Social History: Lupita abuse Tobacco Cessation:  A prescription for an FDA-approved tobacco cessation medication was offered at discharge and the patient refused  Current Medications:  Current Facility-Administered Medications  Medication Dose Route Frequency Provider Last Rate Last Admin   acetaminophen  (TYLENOL ) tablet 650 mg  650 mg Oral Q6H PRN Trudy Carwin, NP       alum & mag hydroxide-simeth (MAALOX/MYLANTA) 200-200-20 MG/5ML suspension 30 mL  30 mL Oral Q4H PRN Trudy Carwin, NP       haloperidol  (HALDOL ) tablet 5 mg  5 mg Oral TID PRN Trudy Carwin, NP       And   diphenhydrAMINE  (BENADRYL ) capsule 50 mg  50 mg Oral TID PRN Trudy Carwin, NP       doxycycline  (VIBRA -TABS) tablet 100 mg  100 mg Oral BID Weber, Kyra A, NP   100 mg at 04/09/24 0907   hydrOXYzine  (ATARAX ) tablet 25 mg  25 mg Oral Q6H PRN Trudy Carwin, NP  lipase/protease/amylase (CREON ) capsule 12,000 Units  12,000 Units Oral TID WC Weber, Kyra A, NP   12,000 Units at 04/09/24 9092   loperamide  (IMODIUM ) capsule 2-4 mg  2-4 mg Oral PRN Trudy Carwin, NP       LORazepam  (ATIVAN ) tablet 1 mg  1 mg Oral Q6H PRN Trudy Carwin, NP        magnesium  hydroxide (MILK OF MAGNESIA) suspension 30 mL  30 mL Oral Daily PRN Trudy Carwin, NP       multivitamin with minerals tablet 1 tablet  1 tablet Oral Daily Trudy Carwin, NP   1 tablet at 04/09/24 9091   OLANZapine  (ZYPREXA ) injection 10 mg  10 mg Intramuscular TID PRN Trudy Carwin, NP       OLANZapine  (ZYPREXA ) injection 5 mg  5 mg Intramuscular TID PRN Trudy Carwin, NP       ondansetron  (ZOFRAN -ODT) disintegrating tablet 4 mg  4 mg Oral Q6H PRN Trudy Carwin, NP       pantoprazole  (PROTONIX ) EC tablet 40 mg  40 mg Oral BID Weber, Kyra A, NP   40 mg at 04/09/24 0908   thiamine  (VITAMIN B1) injection 100 mg  100 mg Intramuscular Once Trudy Carwin, NP       thiamine  (VITAMIN B1) tablet 100 mg  100 mg Oral Daily Weber, Kyra A, NP       traZODone  (DESYREL ) tablet 50 mg  50 mg Oral QHS Weber, Kyra A, NP       Current Outpatient Medications  Medication Sig Dispense Refill   doxycycline  (VIBRA -TABS) 100 MG tablet Take 1 tablet (100 mg total) by mouth 2 (two) times daily for 28 days. (Patient not taking: Reported on 04/07/2024) 56 tablet 0   lipase/protease/amylase (CREON ) 12000-38000 units CPEP capsule Take 1 capsule (12,000 Units total) by mouth 3 (three) times daily with meals. (Patient not taking: Reported on 04/07/2024) 270 capsule 1   pantoprazole  (PROTONIX ) 40 MG tablet Take 1 tablet (40 mg total) by mouth daily. (Patient not taking: Reported on 04/07/2024) 30 tablet 1   thiamine  (VITAMIN B-1) 100 MG tablet Take 1 tablet (100 mg total) by mouth daily. (Patient not taking: Reported on 04/07/2024) 30 tablet 0    PTA Medications:  Facility Ordered Medications  Medication   pantoprazole  (PROTONIX ) EC tablet 40 mg   traZODone  (DESYREL ) tablet 50 mg   thiamine  (VITAMIN B1) tablet 100 mg   lipase/protease/amylase (CREON ) capsule 12,000 Units   doxycycline  (VIBRA -TABS) tablet 100 mg   acetaminophen  (TYLENOL ) tablet 650 mg   alum & mag hydroxide-simeth (MAALOX/MYLANTA) 200-200-20  MG/5ML suspension 30 mL   magnesium  hydroxide (MILK OF MAGNESIA) suspension 30 mL   thiamine  (VITAMIN B1) injection 100 mg   multivitamin with minerals tablet 1 tablet   LORazepam  (ATIVAN ) tablet 1 mg   hydrOXYzine  (ATARAX ) tablet 25 mg   loperamide  (IMODIUM ) capsule 2-4 mg   ondansetron  (ZOFRAN -ODT) disintegrating tablet 4 mg   haloperidol  (HALDOL ) tablet 5 mg   And   diphenhydrAMINE  (BENADRYL ) capsule 50 mg   OLANZapine  (ZYPREXA ) injection 5 mg   OLANZapine  (ZYPREXA ) injection 10 mg   PTA Medications  Medication Sig   thiamine  (VITAMIN B-1) 100 MG tablet Take 1 tablet (100 mg total) by mouth daily. (Patient not taking: Reported on 04/07/2024)   pantoprazole  (PROTONIX ) 40 MG tablet Take 1 tablet (40 mg total) by mouth daily. (Patient not taking: Reported on 04/07/2024)   lipase/protease/amylase (CREON ) 12000-38000 units CPEP capsule Take 1 capsule (12,000 Units total) by mouth 3 (  three) times daily with meals. (Patient not taking: Reported on 04/07/2024)   doxycycline  (VIBRA -TABS) 100 MG tablet Take 1 tablet (100 mg total) by mouth 2 (two) times daily for 28 days. (Patient not taking: Reported on 04/07/2024)       04/08/2024   10:44 PM 02/02/2017   10:39 AM  Depression screen PHQ 2/9  Decreased Interest 1 2  Down, Depressed, Hopeless 2 0  PHQ - 2 Score 3 2  Altered sleeping 1 3  Tired, decreased energy 1 1  Change in appetite 1 0  Feeling bad or failure about yourself  1 0  Trouble concentrating 0 2  Moving slowly or fidgety/restless 0 0  Suicidal thoughts 0   PHQ-9 Score 7 8    Flowsheet Row ED from 04/08/2024 in [facility] ED from 04/07/2024 in [facility] Emergency Department at [facility] ED to Hosp-Admission (Discharged) from 03/29/2024 in [facility] ONCOLOGY  C-SSRS RISK CATEGORY No Risk No Risk No Risk    Musculoskeletal  Strength & Muscle Tone: within normal limits Gait & Station: normal Patient leans: N/A  Psychiatric  Specialty Exam  Presentation  General Appearance:  Appropriate for Environment  Eye Contact:Good  Speech: Normal Rate  Speech Volume: Normal  Handedness: Right   Mood and Affect  Mood: Euthymic  Affect: Appropriate   Thought Process  Thought Processes: Linear  Descriptions of Associations:Intact  Orientation:Full (Time, Place and Person)  Thought Content:Logical     Hallucinations:Hallucinations: None  Ideas of Reference:None  Suicidal Thoughts:Suicidal Thoughts: No  Homicidal Thoughts:Homicidal Thoughts: No   Sensorium  Memory: Immediate Good; Recent Fair; Remote Fair  Judgment: Fair  Insight: Fair   Art therapist  Concentration: Good  Attention Span: Good  Recall: Good  Fund of Knowledge: Good  Language: Good   Psychomotor Activity  Psychomotor Activity: Psychomotor Activity: Normal   Assets  Assets: Communication Skills; Desire for Improvement; Housing; Leisure Time   Sleep  Sleep: Sleep: Good  Estimated Sleeping Duration (Last 24 Hours): 9.00-9.25 hours  Nutritional Assessment (For OBS and FBC admissions only) Has the patient had a weight loss or gain of 10 pounds or more in the last 3 months?: No Has the patient had a decrease in food intake/or appetite?: No Does the patient have dental problems?: No Does the patient have eating habits or behaviors that may be indicators of an eating disorder including binging or inducing vomiting?: No Has the patient recently lost weight without trying?: 0 Has the patient been eating poorly because of a decreased appetite?: 0 Malnutrition Screening Tool Score: 0    Physical Exam  Physical Exam Vitals and nursing note reviewed.  HENT:     Head: Normocephalic.  Eyes:     Extraocular Movements: Extraocular movements intact.  Pulmonary:     Effort: Pulmonary effort is normal.  Musculoskeletal:        General: Normal range of motion.     Cervical back: Normal range  of motion.  Neurological:     General: No focal deficit present.     Mental Status: He is alert and oriented to person, place, and time.  Psychiatric:        Mood and Affect: Mood normal.        Behavior: Behavior normal.    Review of Systems  Constitutional:  Negative for chills and fever.  Respiratory:  Negative for cough.   Cardiovascular:  Negative for chest pain.  Gastrointestinal:  Negative for constipation,  diarrhea, nausea and vomiting.  Genitourinary:  Negative for dysuria.  Musculoskeletal:  Negative for joint pain and myalgias.  Skin:  Negative for rash.  Neurological:  Positive for headaches. Negative for dizziness.  Psychiatric/Behavioral:  Negative for hallucinations and suicidal ideas.    Blood pressure 114/76, pulse 92, temperature 98.3 F (36.8 C), resp. rate 18, SpO2 99%. There is no height or weight on file to calculate BMI.  Demographic Factors:  Male and Unemployed  Loss Factors: NA  Historical Factors: NA  Risk Reduction Factors:   Positive social support and Positive coping skills or problem solving skills  Continued Clinical Symptoms:  Alcohol/Substance Abuse/Dependencies  Cognitive Features That Contribute To Risk:  None    Suicide Risk:  Minimal: No identifiable suicidal ideation.  Patients presenting with no risk factors but with morbid ruminations; may be classified as minimal risk based on the severity of the depressive symptoms  Plan Of Care/Follow-up recommendations:  Activity:  as tolerated Diet:  low sodium, low fat  Disposition: to home with follow up with substance abuse treatment   Corean Anette Potters, MD 04/09/2024, 11:22 AM

## 2024-04-09 NOTE — ED Notes (Signed)
 Patient is in the bedroom sleeping. NAD. Will monitor for safety

## 2024-04-09 NOTE — ED Notes (Signed)
 Pt observed lying in bed. Eyes closed respirations even and non labored. NAD q 15 minute observations continue for safety.

## 2024-04-09 NOTE — ED Notes (Signed)
 Stable. A&O x 4.  Discharging to home self care.   Denies current SI plan and Intent.  Denies HI and A/V hallucinations.   All belongings returned to PT.  F/U instruction reviewed   Pt verbalized understanding
